# Patient Record
Sex: Female | Born: 1945 | Race: White | Hispanic: No | Marital: Single | Smoking: Former smoker
Health system: Southern US, Community
[De-identification: ages and names within clinical notes are randomized; demographics above are authoritative.]

## PROBLEM LIST (undated history)

## (undated) DIAGNOSIS — E43 Unspecified severe protein-calorie malnutrition: Secondary | ICD-10-CM

## (undated) DIAGNOSIS — Z22322 Carrier or suspected carrier of Methicillin resistant Staphylococcus aureus: Secondary | ICD-10-CM

## (undated) DIAGNOSIS — C801 Malignant (primary) neoplasm, unspecified: Secondary | ICD-10-CM

## (undated) DIAGNOSIS — C7951 Secondary malignant neoplasm of bone: Secondary | ICD-10-CM

## (undated) DIAGNOSIS — M069 Rheumatoid arthritis, unspecified: Secondary | ICD-10-CM

## (undated) HISTORY — PX: NO PAST SURGERIES: SHX2092

---

## 1999-02-25 ENCOUNTER — Ambulatory Visit (HOSPITAL_BASED_OUTPATIENT_CLINIC_OR_DEPARTMENT_OTHER): Admission: RE | Admit: 1999-02-25 | Discharge: 1999-02-25 | Payer: Self-pay | Admitting: Orthopedic Surgery

## 2004-02-23 ENCOUNTER — Ambulatory Visit (HOSPITAL_COMMUNITY): Admission: RE | Admit: 2004-02-23 | Discharge: 2004-02-23 | Payer: Self-pay | Admitting: Orthopedic Surgery

## 2004-04-20 ENCOUNTER — Inpatient Hospital Stay (HOSPITAL_COMMUNITY): Admission: RE | Admit: 2004-04-20 | Discharge: 2004-04-21 | Payer: Self-pay | Admitting: Neurological Surgery

## 2014-03-12 ENCOUNTER — Emergency Department (HOSPITAL_COMMUNITY): Payer: Medicare Other

## 2014-03-12 ENCOUNTER — Encounter (HOSPITAL_COMMUNITY): Payer: Self-pay | Admitting: Emergency Medicine

## 2014-03-12 ENCOUNTER — Inpatient Hospital Stay (HOSPITAL_COMMUNITY)
Admission: EM | Admit: 2014-03-12 | Discharge: 2014-04-16 | DRG: 166 | Disposition: E | Payer: Medicare Other | Attending: Internal Medicine | Admitting: Internal Medicine

## 2014-03-12 DIAGNOSIS — Z9181 History of falling: Secondary | ICD-10-CM

## 2014-03-12 DIAGNOSIS — Z79899 Other long term (current) drug therapy: Secondary | ICD-10-CM

## 2014-03-12 DIAGNOSIS — C3492 Malignant neoplasm of unspecified part of left bronchus or lung: Secondary | ICD-10-CM

## 2014-03-12 DIAGNOSIS — Z87891 Personal history of nicotine dependence: Secondary | ICD-10-CM | POA: Diagnosis not present

## 2014-03-12 DIAGNOSIS — C7951 Secondary malignant neoplasm of bone: Secondary | ICD-10-CM | POA: Diagnosis present

## 2014-03-12 DIAGNOSIS — R5081 Fever presenting with conditions classified elsewhere: Secondary | ICD-10-CM | POA: Diagnosis not present

## 2014-03-12 DIAGNOSIS — Z66 Do not resuscitate: Secondary | ICD-10-CM | POA: Diagnosis present

## 2014-03-12 DIAGNOSIS — D759 Disease of blood and blood-forming organs, unspecified: Secondary | ICD-10-CM | POA: Diagnosis present

## 2014-03-12 DIAGNOSIS — F419 Anxiety disorder, unspecified: Secondary | ICD-10-CM | POA: Diagnosis present

## 2014-03-12 DIAGNOSIS — C787 Secondary malignant neoplasm of liver and intrahepatic bile duct: Secondary | ICD-10-CM | POA: Diagnosis present

## 2014-03-12 DIAGNOSIS — S2239XA Fracture of one rib, unspecified side, initial encounter for closed fracture: Secondary | ICD-10-CM | POA: Diagnosis present

## 2014-03-12 DIAGNOSIS — M545 Low back pain, unspecified: Secondary | ICD-10-CM | POA: Diagnosis present

## 2014-03-12 DIAGNOSIS — R222 Localized swelling, mass and lump, trunk: Secondary | ICD-10-CM | POA: Diagnosis present

## 2014-03-12 DIAGNOSIS — M25569 Pain in unspecified knee: Secondary | ICD-10-CM | POA: Diagnosis present

## 2014-03-12 DIAGNOSIS — F329 Major depressive disorder, single episode, unspecified: Secondary | ICD-10-CM | POA: Diagnosis present

## 2014-03-12 DIAGNOSIS — R109 Unspecified abdominal pain: Secondary | ICD-10-CM | POA: Diagnosis present

## 2014-03-12 DIAGNOSIS — M069 Rheumatoid arthritis, unspecified: Secondary | ICD-10-CM | POA: Diagnosis present

## 2014-03-12 DIAGNOSIS — Z515 Encounter for palliative care: Secondary | ICD-10-CM | POA: Diagnosis not present

## 2014-03-12 DIAGNOSIS — D696 Thrombocytopenia, unspecified: Secondary | ICD-10-CM | POA: Diagnosis present

## 2014-03-12 DIAGNOSIS — G893 Neoplasm related pain (acute) (chronic): Secondary | ICD-10-CM | POA: Diagnosis present

## 2014-03-12 DIAGNOSIS — F32A Depression, unspecified: Secondary | ICD-10-CM | POA: Diagnosis present

## 2014-03-12 DIAGNOSIS — R918 Other nonspecific abnormal finding of lung field: Secondary | ICD-10-CM | POA: Diagnosis present

## 2014-03-12 DIAGNOSIS — Z22322 Carrier or suspected carrier of Methicillin resistant Staphylococcus aureus: Secondary | ICD-10-CM

## 2014-03-12 DIAGNOSIS — E43 Unspecified severe protein-calorie malnutrition: Secondary | ICD-10-CM | POA: Diagnosis present

## 2014-03-12 DIAGNOSIS — D638 Anemia in other chronic diseases classified elsewhere: Secondary | ICD-10-CM | POA: Diagnosis present

## 2014-03-12 DIAGNOSIS — K30 Functional dyspepsia: Secondary | ICD-10-CM | POA: Diagnosis not present

## 2014-03-12 DIAGNOSIS — E876 Hypokalemia: Secondary | ICD-10-CM | POA: Diagnosis present

## 2014-03-12 DIAGNOSIS — R110 Nausea: Secondary | ICD-10-CM | POA: Diagnosis present

## 2014-03-12 DIAGNOSIS — C3411 Malignant neoplasm of upper lobe, right bronchus or lung: Principal | ICD-10-CM | POA: Diagnosis present

## 2014-03-12 DIAGNOSIS — Z7401 Bed confinement status: Secondary | ICD-10-CM | POA: Diagnosis not present

## 2014-03-12 DIAGNOSIS — M549 Dorsalgia, unspecified: Secondary | ICD-10-CM

## 2014-03-12 DIAGNOSIS — K1230 Oral mucositis (ulcerative), unspecified: Secondary | ICD-10-CM | POA: Insufficient documentation

## 2014-03-12 DIAGNOSIS — D61810 Antineoplastic chemotherapy induced pancytopenia: Secondary | ICD-10-CM | POA: Diagnosis present

## 2014-03-12 DIAGNOSIS — D6959 Other secondary thrombocytopenia: Secondary | ICD-10-CM | POA: Diagnosis present

## 2014-03-12 DIAGNOSIS — E870 Hyperosmolality and hypernatremia: Secondary | ICD-10-CM | POA: Diagnosis present

## 2014-03-12 DIAGNOSIS — Z7952 Long term (current) use of systemic steroids: Secondary | ICD-10-CM

## 2014-03-12 DIAGNOSIS — R0602 Shortness of breath: Secondary | ICD-10-CM

## 2014-03-12 DIAGNOSIS — E873 Alkalosis: Secondary | ICD-10-CM | POA: Diagnosis present

## 2014-03-12 DIAGNOSIS — J189 Pneumonia, unspecified organism: Secondary | ICD-10-CM | POA: Diagnosis present

## 2014-03-12 DIAGNOSIS — K5900 Constipation, unspecified: Secondary | ICD-10-CM | POA: Diagnosis present

## 2014-03-12 DIAGNOSIS — E860 Dehydration: Secondary | ICD-10-CM | POA: Diagnosis present

## 2014-03-12 DIAGNOSIS — Z9889 Other specified postprocedural states: Secondary | ICD-10-CM

## 2014-03-12 DIAGNOSIS — I10 Essential (primary) hypertension: Secondary | ICD-10-CM | POA: Diagnosis present

## 2014-03-12 DIAGNOSIS — J9601 Acute respiratory failure with hypoxia: Secondary | ICD-10-CM | POA: Insufficient documentation

## 2014-03-12 DIAGNOSIS — Z8614 Personal history of Methicillin resistant Staphylococcus aureus infection: Secondary | ICD-10-CM

## 2014-03-12 DIAGNOSIS — T451X5A Adverse effect of antineoplastic and immunosuppressive drugs, initial encounter: Secondary | ICD-10-CM | POA: Diagnosis present

## 2014-03-12 DIAGNOSIS — G9340 Encephalopathy, unspecified: Secondary | ICD-10-CM | POA: Diagnosis not present

## 2014-03-12 DIAGNOSIS — J90 Pleural effusion, not elsewhere classified: Secondary | ICD-10-CM | POA: Insufficient documentation

## 2014-03-12 DIAGNOSIS — N179 Acute kidney failure, unspecified: Secondary | ICD-10-CM | POA: Diagnosis not present

## 2014-03-12 DIAGNOSIS — G4700 Insomnia, unspecified: Secondary | ICD-10-CM | POA: Diagnosis present

## 2014-03-12 DIAGNOSIS — Z4659 Encounter for fitting and adjustment of other gastrointestinal appliance and device: Secondary | ICD-10-CM

## 2014-03-12 DIAGNOSIS — A419 Sepsis, unspecified organism: Secondary | ICD-10-CM | POA: Diagnosis present

## 2014-03-12 DIAGNOSIS — J918 Pleural effusion in other conditions classified elsewhere: Secondary | ICD-10-CM

## 2014-03-12 DIAGNOSIS — J9811 Atelectasis: Secondary | ICD-10-CM | POA: Insufficient documentation

## 2014-03-12 DIAGNOSIS — J910 Malignant pleural effusion: Secondary | ICD-10-CM | POA: Diagnosis present

## 2014-03-12 DIAGNOSIS — C3490 Malignant neoplasm of unspecified part of unspecified bronchus or lung: Secondary | ICD-10-CM | POA: Insufficient documentation

## 2014-03-12 DIAGNOSIS — C3491 Malignant neoplasm of unspecified part of right bronchus or lung: Secondary | ICD-10-CM

## 2014-03-12 DIAGNOSIS — C801 Malignant (primary) neoplasm, unspecified: Secondary | ICD-10-CM | POA: Diagnosis present

## 2014-03-12 DIAGNOSIS — R630 Anorexia: Secondary | ICD-10-CM | POA: Diagnosis present

## 2014-03-12 HISTORY — DX: Secondary malignant neoplasm of bone: C79.51

## 2014-03-12 HISTORY — DX: Malignant (primary) neoplasm, unspecified: C80.1

## 2014-03-12 HISTORY — DX: Rheumatoid arthritis, unspecified: M06.9

## 2014-03-12 HISTORY — DX: Carrier or suspected carrier of methicillin resistant Staphylococcus aureus: Z22.322

## 2014-03-12 HISTORY — DX: Unspecified severe protein-calorie malnutrition: E43

## 2014-03-12 LAB — URINALYSIS, ROUTINE W REFLEX MICROSCOPIC
Glucose, UA: NEGATIVE mg/dL
Hgb urine dipstick: NEGATIVE
Ketones, ur: 40 mg/dL — AB
Nitrite: NEGATIVE
Protein, ur: NEGATIVE mg/dL
Specific Gravity, Urine: 1.017 (ref 1.005–1.030)
Urobilinogen, UA: 1 mg/dL (ref 0.0–1.0)
pH: 6 (ref 5.0–8.0)

## 2014-03-12 LAB — PRO B NATRIURETIC PEPTIDE: Pro B Natriuretic peptide (BNP): 769.4 pg/mL — ABNORMAL HIGH (ref 0–125)

## 2014-03-12 LAB — COMPREHENSIVE METABOLIC PANEL
ALT: 18 U/L (ref 0–35)
AST: 76 U/L — ABNORMAL HIGH (ref 0–37)
Albumin: 2.2 g/dL — ABNORMAL LOW (ref 3.5–5.2)
Alkaline Phosphatase: 140 U/L — ABNORMAL HIGH (ref 39–117)
Anion gap: 22 — ABNORMAL HIGH (ref 5–15)
BUN: 24 mg/dL — ABNORMAL HIGH (ref 6–23)
CO2: 24 mEq/L (ref 19–32)
Calcium: 10.6 mg/dL — ABNORMAL HIGH (ref 8.4–10.5)
Chloride: 94 mEq/L — ABNORMAL LOW (ref 96–112)
Creatinine, Ser: 0.54 mg/dL (ref 0.50–1.10)
GFR calc Af Amer: >90 mL/min (ref >90)
GFR calc non Af Amer: >90 mL/min (ref >90)
Glucose, Bld: 86 mg/dL (ref 70–99)
Potassium: 4.5 mEq/L (ref 3.7–5.3)
Sodium: 140 mEq/L (ref 137–147)
Total Bilirubin: 0.4 mg/dL (ref 0.3–1.2)
Total Protein: 7.2 g/dL (ref 6.0–8.3)

## 2014-03-12 LAB — CBC WITH DIFFERENTIAL/PLATELET
Basophils Absolute: 0.1 10*3/uL (ref 0.0–0.1)
Basophils Relative: 1 % (ref 0–1)
Eosinophils Absolute: 0.1 10*3/uL (ref 0.0–0.7)
Eosinophils Relative: 1 % (ref 0–5)
HCT: 35.1 % — ABNORMAL LOW (ref 36.0–46.0)
Hemoglobin: 11.3 g/dL — ABNORMAL LOW (ref 12.0–15.0)
Lymphocytes Relative: 14 % (ref 12–46)
Lymphs Abs: 1.9 10*3/uL (ref 0.7–4.0)
MCH: 30.5 pg (ref 26.0–34.0)
MCHC: 32.2 g/dL (ref 30.0–36.0)
MCV: 94.9 fL (ref 78.0–100.0)
Monocytes Absolute: 0.6 10*3/uL (ref 0.1–1.0)
Monocytes Relative: 4 % (ref 3–12)
Neutro Abs: 11.2 10*3/uL — ABNORMAL HIGH (ref 1.7–7.7)
Neutrophils Relative %: 80 % — ABNORMAL HIGH (ref 43–77)
Platelets: 164 10*3/uL (ref 150–400)
RBC: 3.7 MIL/uL — ABNORMAL LOW (ref 3.87–5.11)
RDW: 16.6 % — ABNORMAL HIGH (ref 11.5–15.5)
Smear Review: ADEQUATE
WBC: 13.9 10*3/uL — ABNORMAL HIGH (ref 4.0–10.5)

## 2014-03-12 LAB — I-STAT CG4 LACTIC ACID, ED: Lactic Acid, Venous: 1.73 mmol/L (ref 0.5–2.2)

## 2014-03-12 LAB — I-STAT TROPONIN, ED: Troponin i, poc: 0.01 ng/mL (ref 0.00–0.08)

## 2014-03-12 LAB — URINE MICROSCOPIC-ADD ON

## 2014-03-12 MED ORDER — IOHEXOL 300 MG/ML  SOLN
80.0000 mL | Freq: Once | INTRAMUSCULAR | Status: AC | PRN
  Administered 2014-03-12: 80 mL via INTRAVENOUS

## 2014-03-12 MED ORDER — HEPARIN SODIUM (PORCINE) 5000 UNIT/ML IJ SOLN
5000.0000 [IU] | Freq: Three times a day (TID) | INTRAMUSCULAR | Status: DC
  Administered 2014-03-12 – 2014-03-16 (×10): 5000 [IU] via SUBCUTANEOUS
  Filled 2014-03-12 (×16): qty 1

## 2014-03-12 MED ORDER — AMITRIPTYLINE HCL 25 MG PO TABS
100.0000 mg | ORAL_TABLET | Freq: Every day | ORAL | Status: DC
  Administered 2014-03-12 – 2014-03-22 (×7): 100 mg via ORAL
  Filled 2014-03-12 (×2): qty 1
  Filled 2014-03-12 (×3): qty 4
  Filled 2014-03-12: qty 1
  Filled 2014-03-12: qty 4
  Filled 2014-03-12 (×4): qty 1
  Filled 2014-03-12: qty 4

## 2014-03-12 MED ORDER — HYDROCODONE-ACETAMINOPHEN 5-325 MG PO TABS
1.0000 | ORAL_TABLET | Freq: Four times a day (QID) | ORAL | Status: DC | PRN
  Administered 2014-03-12 – 2014-03-14 (×5): 1 via ORAL
  Filled 2014-03-12 (×5): qty 1

## 2014-03-12 MED ORDER — HYDROCODONE-ACETAMINOPHEN 5-325 MG PO TABS: 1.0000 | ORAL_TABLET | Freq: Four times a day (QID) | ORAL | Status: DC | PRN

## 2014-03-12 MED ORDER — SODIUM CHLORIDE 0.9 % IV SOLN
INTRAVENOUS | Status: DC
  Administered 2014-03-12: 21:00:00 via INTRAVENOUS
  Administered 2014-03-13: 1000 mL via INTRAVENOUS
  Administered 2014-03-14 – 2014-03-23 (×12): via INTRAVENOUS

## 2014-03-12 MED ORDER — DEXTROSE 5 % IV SOLN
500.0000 mg | Freq: Once | INTRAVENOUS | Status: AC
  Administered 2014-03-12: 500 mg via INTRAVENOUS
  Filled 2014-03-12: qty 500

## 2014-03-12 MED ORDER — ACETAMINOPHEN 325 MG PO TABS
650.0000 mg | ORAL_TABLET | Freq: Once | ORAL | Status: AC
  Administered 2014-03-12: 650 mg via ORAL
  Filled 2014-03-12: qty 2

## 2014-03-12 MED ORDER — SODIUM CHLORIDE 0.9 % IV SOLN
500.0000 mg | Freq: Two times a day (BID) | INTRAVENOUS | Status: DC
  Administered 2014-03-12 – 2014-03-16 (×8): 500 mg via INTRAVENOUS
  Filled 2014-03-12 (×11): qty 500

## 2014-03-12 MED ORDER — POLYETHYLENE GLYCOL 3350 17 G PO PACK
17.0000 g | PACK | Freq: Every day | ORAL | Status: DC
  Administered 2014-03-12 – 2014-03-28 (×9): 17 g via ORAL
  Filled 2014-03-12 (×13): qty 1

## 2014-03-12 MED ORDER — PREDNISONE 10 MG PO TABS
30.0000 mg | ORAL_TABLET | Freq: Every day | ORAL | Status: DC
  Administered 2014-03-13 – 2014-03-15 (×3): 30 mg via ORAL
  Filled 2014-03-12 (×4): qty 1

## 2014-03-12 MED ORDER — DEXTROSE 5 % IV SOLN
1.0000 g | Freq: Once | INTRAVENOUS | Status: AC
  Administered 2014-03-12: 1 g via INTRAVENOUS
  Filled 2014-03-12: qty 10

## 2014-03-12 MED ORDER — SODIUM CHLORIDE 0.9 % IV BOLUS (SEPSIS)
2000.0000 mL | Freq: Once | INTRAVENOUS | Status: AC
  Administered 2014-03-12: 2000 mL via INTRAVENOUS

## 2014-03-12 MED ORDER — SODIUM CHLORIDE 0.9 % IV BOLUS (SEPSIS): 1000.0000 mL | INTRAVENOUS | Status: DC

## 2014-03-12 MED ORDER — METHOTREXATE 2.5 MG PO TABS
15.0000 mg | ORAL_TABLET | ORAL | Status: DC
  Administered 2014-03-14: 15 mg via ORAL
  Filled 2014-03-12: qty 6

## 2014-03-12 MED ORDER — FOLIC ACID 1 MG PO TABS
1.0000 mg | ORAL_TABLET | Freq: Every day | ORAL | Status: DC
  Administered 2014-03-13 – 2014-03-25 (×11): 1 mg via ORAL
  Filled 2014-03-12 (×12): qty 1

## 2014-03-12 MED ORDER — GABAPENTIN 400 MG PO CAPS
400.0000 mg | ORAL_CAPSULE | Freq: Every day | ORAL | Status: DC
  Administered 2014-03-13 – 2014-03-20 (×8): 400 mg via ORAL
  Filled 2014-03-12 (×8): qty 1

## 2014-03-12 MED ORDER — SODIUM CHLORIDE 0.9 % IJ SOLN
3.0000 mL | Freq: Two times a day (BID) | INTRAMUSCULAR | Status: DC
  Administered 2014-03-13 – 2014-03-26 (×16): 3 mL via INTRAVENOUS

## 2014-03-12 MED ORDER — PIPERACILLIN-TAZOBACTAM 3.375 G IVPB
3.3750 g | Freq: Three times a day (TID) | INTRAVENOUS | Status: DC
  Administered 2014-03-12 – 2014-03-22 (×27): 3.375 g via INTRAVENOUS
  Filled 2014-03-12 (×32): qty 50

## 2014-03-12 NOTE — ED Provider Notes (Signed)
CSN: [id number]     Arrival date & time 02/15/2014  1157 History   First MD Initiated Contact with Patient 02/24/2014 1251     Chief Complaint  Patient presents with  . Weakness   Level V Caveat--AMS  (Consider location/radiation/quality/duration/timing/severity/associated sxs/prior Treatment) HPI Debbie Howe is a 68 y.o. female with RA who is here today for evaluation of shortness of breath. Patient is accompanied by her sister who says she saw patient on Sunday and thought she had pneumonia and reports she was "breathing funny the week before also". Sister reports patient has had chills but no fever at home, no cough or other URI symptoms. Patient has a 50 year pack smoking history, but has been using vapes for the past year. Sister denies patient has any other past medical history, she has not been to seek care.  History reviewed. No pertinent past medical history. History reviewed. No pertinent past surgical history. History reviewed. No pertinent family history. History  Substance Use Topics  . Smoking status: Not on file  . Smokeless tobacco: Not on file  . Alcohol Use: Yes     Comment: occ   OB History   Grav Para Term Preterm Abortions TAB SAB Ect Mult Living                 Review of Systems  Unable to perform ROS     Allergies  Review of patient's allergies indicates no known allergies.  Home Medications   Prior to Admission medications   Medication Sig Start Date End Date Taking? Authorizing Provider  amitriptyline (ELAVIL) 25 MG tablet Take 100 mg by mouth daily.   Yes Historical Provider, MD  Calcium Carb-Cholecalciferol (CALCIUM 600 + D PO) Take 1 tablet by mouth daily.   Yes Historical Provider, MD  folic acid (FOLVITE) 1 MG tablet Take 1 mg by mouth daily.   Yes Historical Provider, MD  gabapentin (NEURONTIN) 400 MG capsule Take 400 mg by mouth daily.   Yes Historical Provider, MD  methotrexate (RHEUMATREX) 2.5 MG tablet Take 15 mg by mouth once a week. On  Thursday. Caution:Chemotherapy. Protect from light.   Yes Historical Provider, MD  predniSONE (DELTASONE) 5 MG tablet Take 10 mg by mouth daily with breakfast.   Yes Historical Provider, MD  vitamin E 400 UNIT capsule Take 400 Units by mouth 2 (two) times daily.   Yes Historical Provider, MD   BP 139/81  Pulse 125  Temp(Src) 99.6 F (37.6 C) (Oral)  Resp 20  SpO2 97% Physical Exam  Nursing note and vitals reviewed. Constitutional: She appears well-developed and well-nourished.  HENT:  Head: Normocephalic and atraumatic.  Mouth/Throat: Oropharynx is clear and moist.  Eyes: Conjunctivae are normal. Pupils are equal, round, and reactive to light. Right eye exhibits no discharge. Left eye exhibits no discharge. No scleral icterus.  Neck: Normal range of motion. Neck supple. No JVD present. No tracheal deviation present.  Cardiovascular: Regular rhythm and normal heart sounds.   Tachycardic on exam  Pulmonary/Chest: Effort normal. No respiratory distress. She has no wheezes. She has no rales.  Diminished lung sounds right upper lobe. Tachypneic. No stridor  Abdominal: Soft. There is no tenderness.  Musculoskeletal: She exhibits no edema and no tenderness.  Neurological: She is alert.  Cranial Nerves II-XII grossly intact. Patient is alert and oriented to person and place but thinks the year is 94. States she has to go home because it's 1:30 but does not know the reason why.  Skin:  Skin is warm and dry. No rash noted.  Psychiatric: She has a normal mood and affect. Her behavior is normal. Thought content normal.    ED Course  Procedures (including critical care time) Labs Review Labs Reviewed  CBC WITH DIFFERENTIAL - Abnormal; Notable for the following:    WBC 13.9 (*)    RBC 3.70 (*)    Hemoglobin 11.3 (*)    HCT 35.1 (*)    RDW 16.6 (*)    Neutrophils Relative % 80 (*)    Neutro Abs 11.2 (*)    All other components within normal limits  COMPREHENSIVE METABOLIC PANEL -  Abnormal; Notable for the following:    Chloride 94 (*)    BUN 24 (*)    Calcium 10.6 (*)    Albumin 2.2 (*)    AST 76 (*)    Alkaline Phosphatase 140 (*)    Anion gap 22 (*)    All other components within normal limits  PRO B NATRIURETIC PEPTIDE  URINALYSIS, ROUTINE W REFLEX MICROSCOPIC  I-STAT TROPOININ, ED  I-STAT CG4 LACTIC ACID, ED    Imaging Review Dg Chest Port 1 View  03/13/2014   CLINICAL DATA:  Shortness of breath and nausea today.  EXAM: PORTABLE CHEST - 1 VIEW  COMPARISON:  Chest CT 06/18/2010  FINDINGS: Dense right upper lobe airspace consolidation could reflect lobar pneumonia or an obstructing mass (favored). Recommend chest CT with contrast for further evaluation. The left lung is clear. No pleural effusion. The bony thorax is intact.  IMPRESSION: Dense right upper lobe airspace consolidation could reflect lobar pneumonia or obstructing mass (favored). Recommend chest CT with contrast for further evaluation.   Electronically Signed   By: Kalman Jewels M.D.   On: 02/20/2014 13:13     EKG Interpretation None      MDM   White count 13.9, tachycardic, rectal temperature 101, infection from pulmonary source. Code Sepsis Order Set initiated  Blood Cultures obtained x2 IV Rocephin and Zithromax initiated in ED for CAP. Pending result of CT.--CT shows significant malignancy with acute pneumonitis. Will need further evaluation with Oncology/Pulmonology. Discussed ED course, results of imaging as well as plan for admission, patient verbalizes understanding and is amenable.  Consult to Internal Medicine, Dr. Redmond Pulling to come see in ED.  Prior to patient discharge, I discussed and reviewed this case with Dr. Jeneen Rinks    Final diagnoses:  SOB (shortness of breath)        Verl Dicker, PA-C 03/11/2014 [place], MD 03/22/14 [phone]

## 2014-03-12 NOTE — ED Notes (Signed)
Notified CT of IV access.

## 2014-03-12 NOTE — ED Notes (Signed)
Phlebotomy at bedside getting second set of cultures at this time.

## 2014-03-12 NOTE — ED Notes (Signed)
Dr. Jeneen Rinks started IV in left [facility] for CT scan.

## 2014-03-12 NOTE — ED Notes (Signed)
IV team unable to start an IV above wrist level for CT x 2 attempts. Notified Dr. Jeneen Rinks.

## 2014-03-12 NOTE — ED Notes (Signed)
Patient returned from CT

## 2014-03-12 NOTE — ED Provider Notes (Signed)
Patient's doing. Discussed with PA Cartner. His x-ray shows dense right upper lobe consolidation versus mass. On exam has crackles in right upper lobe. Borderline saturations, temp 101. She pressure 84. Spontaneously this pressures improved to 120 and has no additional episodes of hypotension. Initially confused, quickly clears with O2, Tylenol, fluids, no mentating normally. He required pneumonia by history. Blood cultures obtained, given IV Rocephin and Zithromax. Differential diagnosis per radiologist included right upper lobe mass with postobstructive pneumonia. CT recommended. Patient required more proximal 20-gauge Angiocath. Ultrasound-guided IV placed by myself. This is at this point pending. Call placed regarding admission.          Tanna Furry, MD 02/16/2014 [phone]

## 2014-03-12 NOTE — Progress Notes (Signed)
MD made aware of patient's arrival to room. Patient made comfortable and call bell in place. Report given to night shift RN Alleen Borne

## 2014-03-12 NOTE — ED Notes (Signed)
Pt appears very weak and pale, sob at triage. Needed assistance getting out of car. Pt reports not feeling well x 3 weeks. ekg being done. bp 94/64 at triage.

## 2014-03-12 NOTE — H&P (Signed)
Date: 02/24/2014               Patient Name:  Debbie Howe MRN: [id number]  DOB: 1945/09/22 Age / Sex: 68 y.o., female   PCP: Charletta Cousin, MD         Medical Service: Internal Medicine Teaching Service         Attending Physician: Dr. Aldine Contes, MD    First Contact: Dr. Lottie Mussel Pager: [phone]  Second Contact: Dr. Duwaine Maxin Pager: [phone]       After Hours (After 5p/  First Contact Pager: [phone]  weekends / holidays): Second Contact Pager: [phone]   Chief Complaint: shortness of breath  History of Present Illness: Debbie Howe is 68 year old woman with RA who presented with SOB. Per the patient and her significant other, she was in her usual state of health until ~1-1.5 months ago. She then developed a sore midline lower back pain that radiates down her legs. She says this back pain is different than any pain she has had with her RA in the past. She also developed concurrent shortness of breath. She says the SOB is worse when she is lying flat or on exertion. She also reports some unknown wait loss over this time frame as well as night sweats and fatigue. Of note, she has a 50 pack year history of smoking <1 ppd but has been just using vapes for the past year. She denies chest pain, abdominal pain, dysuria, cough.  In the ED, she had WBC 13.9, tachycardia in the 120s, rectal temperature 101, so was started on azithromycin 500 mg IV once,ceftriaxone 1 g once, 2L NS bolus and tylenol 650 mg po once. She had CXR with dense R upper airspace consolidation and then had CT with a large R hilar and suprahilar mass and extensive lymphadenopathy.  Meds: Current Facility-Administered Medications  Medication Dose Route Frequency Provider Last Rate Last Dose  . 0.9 %  sodium chloride infusion   Intravenous Continuous Francesca Oman, DO      . amitriptyline (ELAVIL) tablet 100 mg  100 mg Oral QHS Francesca Oman, DO      . Derrill Memo ON 37/02/6268] folic acid (FOLVITE) tablet 1 mg  1 mg Oral  Daily Francesca Oman, DO      . Derrill Memo ON 03/13/2014] gabapentin (NEURONTIN) capsule 400 mg  400 mg Oral Daily Francesca Oman, DO      . heparin injection 5,000 Units  5,000 Units Subcutaneous 3 times per day Francesca Oman, DO      . HYDROcodone-acetaminophen (NORCO/VICODIN) 5-325 MG per tablet 1 tablet  1 tablet Oral Q6H PRN Jacques Earthly, MD   1 tablet at 03/03/2014 1934  . [START ON 02/23/2014] methotrexate (RHEUMATREX) tablet 15 mg  15 mg Oral Q Thu Alex M Wilson, DO      . piperacillin-tazobactam (ZOSYN) IVPB 3.375 g  3.375 g Intravenous Q8H Nischal Narendra, MD      . polyethylene glycol (MIRALAX / GLYCOLAX) packet 17 g  17 g Oral Daily Jacques Earthly, MD      . Derrill Memo ON 03/13/2014] predniSONE (DELTASONE) tablet 30 mg  30 mg Oral Q breakfast Kelby Aline, MD      . sodium chloride 0.9 % injection 3 mL  3 mL Intravenous Q12H Francesca Oman, DO      . vancomycin (VANCOCIN) 500 mg in sodium chloride 0.9 % 100 mL IVPB  500 mg Intravenous Q12H Aldine Contes, MD  Allergies: Allergies as of 02/17/2014  . (No Known Allergies)   Past Medical History  Diagnosis Date  . Rheumatoid arthritis    Past Surgical History  Procedure Laterality Date  . No past surgeries     History reviewed. No pertinent family history. History   Social History  . Marital Status: Single    Spouse Name: N/A    Number of Children: N/A  . Years of Education: N/A   Occupational History  . Not on file.   Social History Main Topics  . Smoking status: Former Smoker    Quit date: 01/10/2013  . Smokeless tobacco: Not on file  . Alcohol Use: Yes     Comment: occ  . Drug Use: No  . Sexual Activity: Not on file   Other Topics Concern  . Not on file   Social History Narrative  . No narrative on file    Review of Systems: A comprehensive review of systems was negative except for: as noted above in HPI  Physical Exam: Blood pressure 126/76, pulse 104, temperature 98.4 F (36.9 C), temperature  source Oral, resp. rate 23, height 5\' 2"  (1.575 m), weight 133 lb (60.328 kg), SpO2 93.00%.  Gen: A&O x 4, no acute distress, thin HEENT: Atraumatic, PERRL, EOMI, sclerae anicteric, dry mucous membranes Neck: No LAD appreciated Heart: tachycardic but regular, normal S1 S2, no murmurs, rubs, or gallops Lungs: bilateral inspiratory wheezes with basilar crackles, respirations unlabored Abd: Soft, non-tender, non-distended, + bowel sounds, no hepatosplenomegaly Ext: No edema or cyanosis   Lab results: Basic Metabolic Panel:  Recent Labs  03/07/2014 1227  NA 140  K 4.5  CL 94*  CO2 24  GLUCOSE 86  BUN 24*  CREATININE 0.54  CALCIUM 10.6*   Liver Function Tests:  Recent Labs  02/28/2014 1227  AST 76*  ALT 18  ALKPHOS 140*  BILITOT 0.4  PROT 7.2  ALBUMIN 2.2*   CBC:  Recent Labs  03/13/2014 1227  WBC 13.9*  NEUTROABS 11.2*  HGB 11.3*  HCT 35.1*  MCV 94.9  PLT 164   Cardiac Enzymes: i-stat troponin i 0.01 BNP:  Recent Labs  03/11/2014 1515  PROBNP 769.4*   Urinalysis:  Recent Labs  02/18/2014 1401  COLORURINE AMBER*  LABSPEC 1.017  PHURINE 6.0  GLUCOSEU NEGATIVE  HGBUR NEGATIVE  BILIRUBINUR MODERATE*  KETONESUR 40*  PROTEINUR NEGATIVE  UROBILINOGEN 1.0  NITRITE NEGATIVE  LEUKOCYTESUR SMALL*   Misc. Labs: i-stat lactic acid 1.73  Imaging results:  Ct Chest W Contrast  03/05/2014   CLINICAL DATA:  Shortness of breath. Right upper lobe consolidation.  EXAM: CT CHEST WITH CONTRAST  TECHNIQUE: Multidetector CT imaging of the chest was performed during intravenous contrast administration.  CONTRAST:  58mL OMNIPAQUE IOHEXOL 300 MG/ML  SOLN  COMPARISON:  03/06/2014; 06/18/2010  FINDINGS: Unfortunately there is extensive mediastinal and right hilar adenopathy and right hilar mass causing obstruction of the right upper lobe bronchus and postobstructive pneumonitis/ drowned lung appearance. The super hilar component of the mass is difficult to measure given the  similar density of surrounding atelectatic lung but based on the surrounding vessels is estimated to be about 5.4 by 4.1 cm. An upper right paratracheal lymph node measures 1.8 cm in short axis, image 13 series 2. A precarinal lymph node has indistinct margins but measures approximately 3 cm in short axis, image 23 series 2. Subcarinal node measures 3 cm in short axis, image 27 series 2. Infrahilar node on the right 2 cm in  short axis, image 32 series 2.  There is some extrinsic mass effect on the right pulmonary artery and its branches. I cannot exclude invasion of the right pulmonary artery with bland or tumor thrombus in the right pulmonary artery on images 29 through 34 of series 2. Similar appearance on images 61 through 47 of series 6. Tumor and/or confluent nodal disease surrounds the bronchus intermedius and carina. 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 Paratracheal and right upper internal mammary adenopathy. A pathologic right axillary lymph node measures 1.7 cm in short axis, image 23 series 2.  Small right pleural effusion associated with a 0.9 by 0.7 cm pleural nodule medially along the right posterior pleura, image 45 series 2. There is also pleural nodularity further laterally at the same level measuring approximately 1.6 by 0.6 cm. Mild pleural nodularity along the right middle lobe. There is pleural nodularity along the right hemidiaphragm. Appearance compatible with malignant effusion.  Multiple abnormal hypodense lesions are present in the visualized right hepatic lobe. An index lesion in segment 8 measures 1.3 by 0.9 cm on image 49 of series 2. At least 2 other hypodense lesions are present in the liver. There is scarring in the right mid kidney anteriorly. No adrenal mass.  A superior segment right lower lobe nodule measures 0.5 by 0.4 cm on image 16 of series 3.  Bilateral C7 cervical ribs. Right supraclavicular lymph node short axis diameter 1.6 cm,  image 9 series 2. No definite osseous metastatic disease. Mild coronary artery atherosclerosis in the left main coronary.  IMPRESSION: 1. Unfortunately the patient has a large right hilar and suprahilar mass with extensive adenopathy in the mediastinum, right hilum, right infrahilar region, right axilla, and right supraclavicular region in addition to a small but malignant right pleural effusion. Appearance compatible with lung cancer, tissue diagnosis recommended. Hypodense liver lesions are not entirely specific but concerning for metastatic disease -nuclear medicine PET-CT may be helpful in determining the full extent of metastatic burden. Extensive postobstructive pneumonitis in the right upper lobe. 2. Right hilar mass is causing extrinsic compression on the right pulmonary artery to such a degree that I cannot completely exclude pulmonary arterial invasion with bland or tumor thrombus in the right pulmonary artery and its branches. Today's exam was not performed with the pulmonary embolus protocol. 3. Superior segment right lower lobe nodule, 0.5 by 0.4 cm, nonspecific but possibly metastatic. 4. 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   Electronically Signed   By: Sherryl Barters M.D.   On: 02/15/2014 16:39   Dg Chest Port 1 View  02/28/2014   CLINICAL DATA:  Shortness of breath and nausea today.  EXAM: PORTABLE CHEST - 1 VIEW  COMPARISON:  Chest CT 06/18/2010  FINDINGS: Dense right upper lobe airspace consolidation could reflect lobar pneumonia or an obstructing mass (favored). Recommend chest CT with contrast for further evaluation. The left lung is clear. No pleural effusion. The bony thorax is intact.  IMPRESSION: Dense right upper lobe airspace consolidation could reflect lobar pneumonia or obstructing mass (favored). Recommend chest CT with contrast for  further evaluation.   Electronically Signed   By: Kalman Jewels M.D.   On: 02/17/2014 13:13    Other results: EKG: sinus tachycardia, sinus rhythm, PR 122, QRS 72, QTc 431, no prior  Assessment & Plan by Problem: Active Problems:   Pneumonia   #  Thoracic Mass: Large R hilar mass is likely responsible for SOB. She may have superimposed pneumonia secondary to obstructive effect or the mass is responsible for leukocytosis, fever, tachycardia. She was sating in the upper 90s on room air during our exam. These findings on CT are concerning for lung cancer. Debbie Howe does have an extensive 69 year smoking history. Her back pain may be due to metastasis v her RA. The patient and her significant other are aware of the results of the CT and we explained that a tissue biopsy is required before further prognostication. 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 -call pulmonology regarding tissue biopsy -vanc/zosyn per pharmacy -NS @ 75 cc/hr -f/u BCx x 2 -cardiac monitoring -repeat CBC, CMP in am  #Rheumatoid Arthritis: Debbie Howe is on methotrexate 15 mg weekly on Thursdays and prednisone 10 mg daily. This may explain her back pain versus metastasis. -cont methotrexate 15 mg po weekly on Thursdays -prednisone 30 mg daily x 3 days as stress dose -vicodin 5-325 po q6hprn  #s/p orthopedic neck surgery: Patient and significant other report she had neck surgery several years ago but could not describe further with no known complications -cont gabapentin 400 mg daily presumed for this diagnsois  #Insomnia: Patient reports that she takes amitriptyline 100 mg daily for insomnia but question whether she is not forthcoming with any past psych history -continue amitripyline 100 mg daily   #Asymptomatic bacteruria: Patient has many bacteria on UA but denies dysuria. She is immunosuppressed and we have begun abx for possible postobstructive  PNA -cont abx per above  #Diet: regular  #DVT ppx: heparin 5000 u tid, SCD  Dispo: Disposition is deferred at this time, awaiting improvement of current medical problems. Anticipated discharge in approximately 2 day(s).   The patient does have a current PCP Charletta Cousin, MD) and does need an [facility] hospital follow-up appointment after discharge.  The patient does not know have transportation limitations that hinder transportation to clinic appointments.  Signed: Kelby Aline, MD 02/21/2014, 8:07 PM

## 2014-03-12 NOTE — Progress Notes (Signed)
Notified MD oncall that pt states she has not had a BM since Sunday 10/18. Pt running Stach HR 100-120's on tele. Pt asymptomatic. Will continue to monitor pt. Ranelle Oyster, RN

## 2014-03-12 NOTE — ED Notes (Signed)
Iv therapist returned call and will come to er asap to place iv

## 2014-03-12 NOTE — ED Notes (Signed)
Iv therapy paged

## 2014-03-12 NOTE — ED Notes (Signed)
IV team at bedside trying to get IV access for CT contrast.

## 2014-03-12 NOTE — Progress Notes (Signed)
Report received from [place], [place] for admission to [phone]

## 2014-03-12 NOTE — Progress Notes (Signed)
ANTIBIOTIC CONSULT NOTE - INITIAL  Pharmacy Consult for Vancomycin, Zosyn Indication: pneumonia  No Known Allergies  Patient Measurements: Height: 5\' 2"  (157.5 cm) Weight: 133 lb (60.328 kg) IBW/kg (Calculated) : 50.1  Labs:  Recent Labs  02/14/2014 1227  WBC 13.9*  HGB 11.3*  PLT 164  CREATININE 0.54   Estimated Creatinine Clearance: 56 ml/min (by C-G formula based on Cr of 0.54). No results found for this basename: VANCOTROUGH, VANCOPEAK, VANCORANDOM, GENTTROUGH, GENTPEAK, GENTRANDOM, TOBRATROUGH, TOBRAPEAK, TOBRARND, AMIKACINPEAK, AMIKACINTROU, AMIKACIN,  in the last 72 hours   Microbiology: No results found for this or any previous visit (from the past 720 hour(s)).  Medical History: Past Medical History  Diagnosis Date  . Rheumatoid arthritis     Assessment: 68 year old female beginning Vancomycin and Zosyn for pneumonia. Her renal function is stable. She received Rocephin and ZIthromax in the ED  Goal of Therapy:  Vancomycin trough level 15-20 mcg/ml Appropriate Zosyn dosing  Plan:  Zosyn 3.375 grams iv Q 8 hour - 4 hr infusion Vancomycin 500 mg iv Q 12 hours Follow Scr, progress, fever trend, cultures  Thank you. Anette Guarneri, PharmD [phone]  Debbie Howe 03/08/2014,7:36 PM

## 2014-03-13 DIAGNOSIS — G4700 Insomnia, unspecified: Secondary | ICD-10-CM

## 2014-03-13 DIAGNOSIS — D696 Thrombocytopenia, unspecified: Secondary | ICD-10-CM

## 2014-03-13 DIAGNOSIS — R918 Other nonspecific abnormal finding of lung field: Secondary | ICD-10-CM

## 2014-03-13 DIAGNOSIS — R0602 Shortness of breath: Secondary | ICD-10-CM

## 2014-03-13 DIAGNOSIS — M069 Rheumatoid arthritis, unspecified: Secondary | ICD-10-CM

## 2014-03-13 DIAGNOSIS — D649 Anemia, unspecified: Secondary | ICD-10-CM

## 2014-03-13 DIAGNOSIS — N390 Urinary tract infection, site not specified: Secondary | ICD-10-CM

## 2014-03-13 LAB — COMPREHENSIVE METABOLIC PANEL
ALBUMIN: 1.7 g/dL — AB (ref 3.5–5.2)
ALK PHOS: 142 U/L — AB (ref 39–117)
ALT: 16 U/L (ref 0–35)
ANION GAP: 16 — AB (ref 5–15)
AST: 83 U/L — AB (ref 0–37)
BUN: 15 mg/dL (ref 6–23)
CHLORIDE: 99 meq/L (ref 96–112)
CO2: 21 mEq/L (ref 19–32)
Calcium: 9.1 mg/dL (ref 8.4–10.5)
Creatinine, Ser: 0.58 mg/dL (ref 0.50–1.10)
GFR calc Af Amer: >90 mL/min (ref >90)
GFR calc non Af Amer: >90 mL/min (ref >90)
Glucose, Bld: 73 mg/dL (ref 70–99)
Potassium: 4.2 mEq/L (ref 3.7–5.3)
SODIUM: 136 meq/L — AB (ref 137–147)
Total Bilirubin: 0.4 mg/dL (ref 0.3–1.2)
Total Protein: 6.3 g/dL (ref 6.0–8.3)

## 2014-03-13 LAB — CBC
HEMATOCRIT: 29.8 % — AB (ref 36.0–46.0)
Hemoglobin: 9.3 g/dL — ABNORMAL LOW (ref 12.0–15.0)
MCH: 29.7 pg (ref 26.0–34.0)
MCHC: 31.2 g/dL (ref 30.0–36.0)
MCV: 95.2 fL (ref 78.0–100.0)
Platelets: 121 10*3/uL — ABNORMAL LOW (ref 150–400)
RBC: 3.13 MIL/uL — ABNORMAL LOW (ref 3.87–5.11)
RDW: 16.6 % — ABNORMAL HIGH (ref 11.5–15.5)
WBC: 10.1 10*3/uL (ref 4.0–10.5)

## 2014-03-13 LAB — URINE CULTURE: Colony Count: >=100000

## 2014-03-13 MED ORDER — BISACODYL 10 MG RE SUPP
10.0000 mg | Freq: Every day | RECTAL | Status: DC | PRN
  Administered 2014-03-13: 10 mg via RECTAL
  Filled 2014-03-13: qty 1

## 2014-03-13 MED ORDER — ALBUTEROL SULFATE (2.5 MG/3ML) 0.083% IN NEBU
2.5000 mg | INHALATION_SOLUTION | RESPIRATORY_TRACT | Status: DC | PRN
  Administered 2014-03-14: 2.5 mg via RESPIRATORY_TRACT
  Filled 2014-03-13 (×2): qty 3

## 2014-03-13 MED ORDER — ENSURE COMPLETE PO LIQD
237.0000 mL | Freq: Two times a day (BID) | ORAL | Status: DC
  Administered 2014-03-13 – 2014-03-19 (×6): 237 mL via ORAL

## 2014-03-13 NOTE — Progress Notes (Signed)
INITIAL NUTRITION ASSESSMENT  DOCUMENTATION CODES Per approved criteria  -Not Applicable   INTERVENTION: Ensure Complete po BID, each supplement provides 350 kcal and 13 grams of protein  NUTRITION DIAGNOSIS: Inadequate oral intake related to poor appetite, illness for the last 2 months as evidenced by Meal Completion: <25%.   Goal: Pt to meet >/= 90% of their estimated nutrition needs   Monitor:  PO intake, supplement acceptance, weight trends, labs  Reason for Assessment: Pt identified as at nutrition risk on the Malnutrition Screen Tool  68 y.o. female  Admitting Dx: PNA  ASSESSMENT: Pt with hx of RA, admitted with SOB, pt has extensive 50 year smoking hx. Per CT pt has a large right hilar mass which is concerning for lung ca. Pulmonology consulted for biopsy.  Pt with a lot of family in the room. Pt lives with her husband and son. Per family pt has been eating very poorly for the last 1 1/2 - 2 months. 24 hr recall: Breakfast: cereal or an egg, Lunch: nothing, Dinner: bites. Pt has had a poor appetite  Pt has not tried any PO supplements before but they feel she would like chocolate ensure.  Family very concerned about pt. Pt very sleepy but states she has probably lost 25 lb. Could not confirm this as she also states she usually weighs 138 lb.   Nutrition Focused Physical Exam:  Subcutaneous Fat:  Orbital Region: WDL Upper Arm Region: WDL Thoracic and Lumbar Region: WDL  Muscle:  Temple Region: WDL Clavicle Bone Region: WDL Clavicle and Acromion Bone Region: WDL Scapular Bone Region: WDL Dorsal Hand: WDL Patellar Region: WDL Anterior Thigh Region: WDL Posterior Calf Region: mild depletion   Edema: not present   Height: Ht Readings from Last 1 Encounters:  02/21/2014 5\' 2"  (1.575 m)    Weight: Wt Readings from Last 1 Encounters:  03/08/2014 133 lb (60.328 kg)    Ideal Body Weight: 50 kg   % Ideal Body Weight: 121%  Wt Readings from Last 10 Encounters:   03/07/2014 133 lb (60.328 kg)    Usual Body Weight: 138 lb   % Usual Body Weight: 96%  BMI:  Body mass index is 24.32 kg/(m^2).  Estimated Nutritional Needs: Kcal: 1600-1800 Protein: 75-90 grams Fluid: >1.6 L/day  Skin: ecchymosis  Diet Order: General  EDUCATION NEEDS: -No education needs identified at this time   Intake/Output Summary (Last 24 hours) at 03/13/14 0850 Last data filed at 03/13/14 0615  Gross per 24 hour  Intake 1251.25 ml  Output      0 ml  Net 1251.25 ml    Last BM: 10/18 per pt report, RN and MD aware   Labs:   Recent Labs Lab 02/23/2014 1227  NA 140  K 4.5  CL 94*  CO2 24  BUN 24*  CREATININE 0.54  CALCIUM 10.6*  GLUCOSE 86    CBG (last 3)  No results found for this basename: GLUCAP,  in the last 72 hours  Scheduled Meds: . amitriptyline  100 mg Oral QHS  . folic acid  1 mg Oral Daily  . gabapentin  400 mg Oral Daily  . heparin  5,000 Units Subcutaneous 3 times per day  . [START ON 02/19/2014] methotrexate  15 mg Oral Q Thu  . piperacillin-tazobactam (ZOSYN)  IV  3.375 g Intravenous Q8H  . polyethylene glycol  17 g Oral Daily  . predniSONE  30 mg Oral Q breakfast  . sodium chloride  3 mL Intravenous Q12H  .  vancomycin  500 mg Intravenous Q12H    Continuous Infusions: . sodium chloride 75 mL/hr at 02/16/2014 2043    Past Medical History  Diagnosis Date  . Rheumatoid arthritis     Past Surgical History  Procedure Laterality Date  . No past surgeries      [place], [place], [place] Pager [phone] After Hours Pager

## 2014-03-13 NOTE — Progress Notes (Signed)
Utilization review completed.  

## 2014-03-13 NOTE — Consult Note (Signed)
Name: Debbie Howe MRN: [id number] DOB: [birth year]    ADMISSION DATE:  02/27/2014 CONSULTATION DATE:  03/13/2014  REFERRING MD :  Dr Dareen Piano - IMTS  CHIEF COMPLAINT:  Dyspnea  BRIEF PATIENT DESCRIPTION: 68 year old female who presented to [facility] ED 10/28 c/o SOB and back pain with radiation to legs for 1.5 months. She has significant smoking history. CT scan in ED showed large R hilar/superhilar mass with extensive lymphadenopathy. PCCM consulted for tissue biopsy.   SIGNIFICANT EVENTS    STUDIES:  CT chest 10/27 > Large right hilar and suprahilar mass with extensive adenopathy, small R effusion, hypodensities on liver, Postobstructive PNA RUL. Compression on R pulmonary artery.    HISTORY OF PRESENT ILLNESS:  68 year old female with RA who presented with SOB. She has a 50 pack year smoking history. She was in her usual state of health until 2-3 months ago. When she developed worsening back pain. She has always had some degree of back pain, as she has had several back surgeries. Pain is describes as mid lower back with radiation to legs. Pain had become so bad that husband had to help her ambulate around the house. 1 month ago family noted a significant change in her breathing. She became easily dyspneic with normal household activities. This continued to progress, and family notices a weight loss. States she hasn't been eating and she sleeps all the time. 2 weeks ago she had a fall onto her buttocks that significantly changed her back pain. It was so painful after that she was barely able to move to bedside commode. Family states that this was worked up somewhere in [place] but they do not know the results of that workup. She [facility] c/o these symptoms. CXR in ED showed RUL consolidation prompting CT, which showed large RUL mass. She was admitted under IMTS and PCCM has been asked to see for tissue sampling.     PAST MEDICAL HISTORY :   has a past medical history of  Rheumatoid arthritis.  has past surgical history that includes No past surgeries. Prior to Admission medications   Medication Sig Start Date End Date Taking? Authorizing Provider  amitriptyline (ELAVIL) 25 MG tablet Take 100 mg by mouth daily.   Yes Historical Provider, MD  Calcium Carb-Cholecalciferol (CALCIUM 600 + D PO) Take 1 tablet by mouth daily.   Yes Historical Provider, MD  folic acid (FOLVITE) 1 MG tablet Take 1 mg by mouth daily.   Yes Historical Provider, MD  gabapentin (NEURONTIN) 400 MG capsule Take 400 mg by mouth daily.   Yes Historical Provider, MD  methotrexate (RHEUMATREX) 2.5 MG tablet Take 15 mg by mouth once a week. On Thursday. Caution:Chemotherapy. Protect from light.   Yes Historical Provider, MD  predniSONE (DELTASONE) 5 MG tablet Take 10 mg by mouth daily with breakfast.   Yes Historical Provider, MD  vitamin E 400 UNIT capsule Take 400 Units by mouth 2 (two) times daily.   Yes Historical Provider, MD   No Known Allergies  FAMILY HISTORY:  family history is not on file. SOCIAL HISTORY:  reports that she quit smoking about 14 months ago. She does not have any smokeless tobacco history on file. She reports that she drinks alcohol. She reports that she does not use illicit drugs.  REVIEW OF SYSTEMS:   Obtained from family and pt as she was lethargic at time of interview.  Bolds are positive  Constitutional: weight loss, gain, night sweats,  Fevers, chills, fatigue .  HEENT: headaches, Sore throat, sneezing, nasal congestion, post nasal drip, Difficulty swallowing, Tooth/dental problems, visual complaints visual changes, ear ache CV:  chest pain, radiates: ,Orthopnea, PND, swelling in lower extremities, dizziness, palpitations, syncope.  GI  heartburn, indigestion, abdominal pain, nausea, vomiting, diarrhea, change in bowel habits, loss of appetite, bloody stools.  Resp: cough, productive: , hemoptysis, dyspnea, chest pain, pleuritic.  Skin: rash or itching or  icterus GU: dysuria, change in color of urine, urgency or frequency. flank pain, hematuria  MS: back pain with radiation to BLE or swelling. decreased range of motion  Psych: change in mood or affect. depression or anxiety.  Neuro: difficulty with speech, weakness, numbness, ataxia   SUBJECTIVE:   VITAL SIGNS: Temp:  [98.3 F (36.8 C)-101 F (38.3 C)] 98.3 F (36.8 C) (10/28 0541) Pulse Rate:  [104-133] 125 (10/28 0541) Resp:  [17-25] 18 (10/28 0541) BP: (94-152)/(52-97) 151/80 mmHg (10/28 0541) SpO2:  [90 %-100 %] 94 % (10/28 0541) Weight:  [57.153 kg (126 lb)-60.328 kg (133 lb)] 60.328 kg (133 lb) (10/27 1902)  PHYSICAL EXAMINATION: General:  Elderly female sleeping in NAD Neuro:  Lethargic, alert to verbal, answers appropriately, follows commands, and falls back asleep. Oriented x 3 HEENT:  [place]/AT, No JVD noted Cardiovascular:  RRR, no MRG Lungs:  Diminished R apex with rhonchi, otherwise clear. Mildly labored. Abdomen: Soft, non-tender, non-distended Musculoskeletal:  No acute deformity, pain limits ROM Skin: Intact   Recent Labs Lab 02/28/2014 1227 03/13/14 0714  NA 140 136*  K 4.5 4.2  CL 94* 99  CO2 24 21  BUN 24* 15  CREATININE 0.54 0.58  GLUCOSE 86 73    Recent Labs Lab 03/07/2014 1227 03/13/14 0714  HGB 11.3* 9.3*  HCT 35.1* 29.8*  WBC 13.9* 10.1  PLT 164 121*   Ct Chest W Contrast  02/15/2014   CLINICAL DATA:  Shortness of breath. Right upper lobe consolidation.  EXAM: CT CHEST WITH CONTRAST  TECHNIQUE: Multidetector CT imaging of the chest was performed during intravenous contrast administration.  CONTRAST:  15mL OMNIPAQUE IOHEXOL 300 MG/ML  SOLN  COMPARISON:  02/14/2014; 06/18/2010  FINDINGS: Unfortunately there is extensive mediastinal and right hilar adenopathy and right hilar mass causing obstruction of the right upper lobe bronchus and postobstructive pneumonitis/ drowned lung appearance. The super hilar component of the mass is difficult to measure  given the similar density of surrounding atelectatic lung but based on the surrounding vessels is estimated to be about 5.4 by 4.1 cm. An upper right paratracheal lymph node measures 1.8 cm in short axis, image 13 series 2. A precarinal lymph node has indistinct margins but measures approximately 3 cm in short axis, image 23 series 2. Subcarinal node measures 3 cm in short axis, image 27 series 2. Infrahilar node on the right 2 cm in short axis, image 32 series 2.  There is some extrinsic mass effect on the right pulmonary artery and its branches. I cannot exclude invasion of the right pulmonary artery with bland or tumor thrombus in the right pulmonary artery on images 29 through 34 of series 2. Similar appearance on images 61 through 47 of series 6. Tumor and/or confluent nodal disease surrounds the bronchus intermedius and carina. 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 Paratracheal and right upper internal mammary adenopathy. A pathologic right axillary lymph node measures 1.7 cm in short axis, image 23 series 2.  Small right pleural effusion associated with a 0.9  by 0.7 cm pleural nodule medially along the right posterior pleura, image 45 series 2. There is also pleural nodularity further laterally at the same level measuring approximately 1.6 by 0.6 cm. Mild pleural nodularity along the right middle lobe. There is pleural nodularity along the right hemidiaphragm. Appearance compatible with malignant effusion.  Multiple abnormal hypodense lesions are present in the visualized right hepatic lobe. An index lesion in segment 8 measures 1.3 by 0.9 cm on image 49 of series 2. At least 2 other hypodense lesions are present in the liver. There is scarring in the right mid kidney anteriorly. No adrenal mass.  A superior segment right lower lobe nodule measures 0.5 by 0.4 cm on image 16 of series 3.  Bilateral C7 cervical ribs. Right supraclavicular lymph node short axis  diameter 1.6 cm, image 9 series 2. No definite osseous metastatic disease. Mild coronary artery atherosclerosis in the left main coronary.  IMPRESSION: 1. Unfortunately the patient has a large right hilar and suprahilar mass with extensive adenopathy in the mediastinum, right hilum, right infrahilar region, right axilla, and right supraclavicular region in addition to a small but malignant right pleural effusion. Appearance compatible with lung cancer, tissue diagnosis recommended. Hypodense liver lesions are not entirely specific but concerning for metastatic disease -nuclear medicine PET-CT may be helpful in determining the full extent of metastatic burden. Extensive postobstructive pneumonitis in the right upper lobe. 2. Right hilar mass is causing extrinsic compression on the right pulmonary artery to such a degree that I cannot completely exclude pulmonary arterial invasion with bland or tumor thrombus in the right pulmonary artery and its branches. Today's exam was not performed with the pulmonary embolus protocol. 3. Superior segment right lower lobe nodule, 0.5 by 0.4 cm, nonspecific but possibly metastatic. 4. 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   Electronically Signed   By: Sherryl Barters M.D.   On: 02/14/2014 16:39   Dg Chest Port 1 View  03/08/2014   CLINICAL DATA:  Shortness of breath and nausea today.  EXAM: PORTABLE CHEST - 1 VIEW  COMPARISON:  Chest CT 06/18/2010  FINDINGS: Dense right upper lobe airspace consolidation could reflect lobar pneumonia or an obstructing mass (favored). Recommend chest CT with contrast for further evaluation. The left lung is clear. No pleural effusion. The bony thorax is intact.  IMPRESSION: Dense right upper lobe airspace consolidation could reflect lobar pneumonia or obstructing mass (favored). Recommend chest CT  with contrast for further evaluation.   Electronically Signed   By: Kalman Jewels M.D.   On: 03/16/2014 13:13    ASSESSMENT / PLAN:  RUL mass Post-obstructive pneumonitis Small R effusion - Will schedule FOB for 10/29 - Light breakfast 10/29 AM then NPO - Continue empiric antibiotics - Add PRN albuterol nebs - Continue to follow cultures - Consider PCT trend to limit ABX exposure.    Georgann Housekeeper, ACNP  Pulmonology/Critical Care Pager [phone] or [phone]  PCCM ATTENDING: I have interviewed and examined the patient and reviewed the database. I have formulated the assessment and plan as reflected in the note above with amendments made by me.   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

## 2014-03-13 NOTE — Progress Notes (Signed)
Pt seen and examined with Dr. Ethelene Hal.  In brief, pt is a 67 y/o female with PMH of RA on MTX  Who p/w SOB. Per pt she hasn't been feeling well for approx 1 month because she developed LBP radiating down her legs. Pt states pain has gradually gotten worse and had x rays taken as an outpatient 1 week ago with her PCP. Pt also complained of SOB which has gradually worsened since then and had assoc night sweats and fatigue. Pt presented to ED for further w/u. She laso noted weight loss over the last month but is unable to quantify it. No HA, no blurry vision, no syncope, no hematuria, no BRBPR, no melena, no focal weakness, no abd pain, no n/v, no diarrhea. Remaining ROS negative  Exam: Gen: AAO*3, NAD CVS: tachycardic, normal heart sounds Pulm: bilateral crackles, wheezes + Abd: soft, non tender, BS + Ext: no edema  Assessment and Plan: 68 y/o female p/w SOB, back pain found to have RUL lung mass with post obstructive pneumonitis  Lung mass/post obstructive PNA: - Pulm consult noted - Pt for bronch in AM - c/w vanco/zosyn for now - monitor on tele - f/u blood cx  Hypercalcemia: - Calcium improved today with IVF. Will c/w IVF for now - Initial corrected calcium is approx 12 - Will monitor - Would check PTHRP, PTH (likely secondary to underlying malignancy) - Would obtain results of recent x rays done as an outpatient - possible bony mets - c/w  Pain control  Thrombocytopenia/anemia: - decreased platelet count and worsening anemia on today's labs (Hg decreased to 9.3 from 11.3) - Likely dilutional given IVF. ? Baseline. Will obtain records from PCP - It is also possible given anemia, back pain and hypercalcemia that pt may have myeloma but unlikely given large lung mass consistent with primary lung cancer Will consider further w/u if x rays with lytic lesions

## 2014-03-13 NOTE — H&P (Signed)
Pt seen and examined with Dr. Ethelene Hal. I agree with documentation as outlined. Please refer to my progress note from today for further details

## 2014-03-13 NOTE — Progress Notes (Signed)
Subjective: Debbie Howe had no acute events overnight. She says the vicodin is helping with her back pain but when it wears off it is unchanged from the past month. She reports subjective fevers and a dry cough but denies any shortness of breath as she has been resting upright in bed or chest pain.  Objective: Vital signs in last 24 hours: Filed Vitals:   02/18/2014 1902 02/21/2014 1904 02/16/2014 2106 03/13/14 0541  BP:  126/76 115/66 151/80  Pulse:  104 112 125  Temp:   98.7 F (37.1 C) 98.3 F (36.8 C)  TempSrc:  Oral Oral Oral  Resp:   20 18  Height: 5\' 2"  (1.575 m)     Weight: 133 lb (60.328 kg)     SpO2:  93% 100% 94%   Weight change:   Intake/Output Summary (Last 24 hours) at 03/13/14 1345 Last data filed at 03/13/14 0900  Gross per 24 hour  Intake 1351.25 ml  Output      0 ml  Net 1351.25 ml   Gen: A&O x 4, no acute distress, thin  HEENT: Atraumatic, PERRL, EOMI, sclerae anicteric, less dry mucous membranes  Heart: tachycardic but regular, normal S1 S2, no murmurs, rubs, or gallops  Lungs: bilateral inspiratory wheezes with basilar crackles, respirations unlabored  Abd: Soft, non-tender, non-distended, + bowel sounds, no hepatosplenomegaly  Ext: No edema or cyanosis  Lab Results: Basic Metabolic Panel:  Recent Labs Lab 02/26/2014 1227 03/13/14 0714  NA 140 136*  K 4.5 4.2  CL 94* 99  CO2 24 21  GLUCOSE 86 73  BUN 24* 15  CREATININE 0.54 0.58  CALCIUM 10.6* 9.1   Liver Function Tests:  Recent Labs Lab 03/01/2014 1227 03/13/14 0714  AST 76* 83*  ALT 18 16  ALKPHOS 140* 142*  BILITOT 0.4 0.4  PROT 7.2 6.3  ALBUMIN 2.2* 1.7*   CBC:  Recent Labs Lab 03/04/2014 1227 03/13/14 0714  WBC 13.9* 10.1  NEUTROABS 11.2*  --   HGB 11.3* 9.3*  HCT 35.1* 29.8*  MCV 94.9 95.2  PLT 164 121*   BNP:  Recent Labs Lab 02/27/2014 1515  PROBNP 769.4*   Urinalysis:  Recent Labs Lab 02/17/2014 1401  COLORURINE AMBER*  LABSPEC 1.017  PHURINE 6.0  GLUCOSEU  NEGATIVE  HGBUR NEGATIVE  BILIRUBINUR MODERATE*  KETONESUR 40*  PROTEINUR NEGATIVE  UROBILINOGEN 1.0  NITRITE NEGATIVE  LEUKOCYTESUR SMALL*  many bacteria, many squamous epithelial cells, hyaline casts  Micro Results: Recent Results (from the past 240 hour(s))  CULTURE, BLOOD (ROUTINE X 2)     Status: None   Collection Time    03/02/2014  2:20 PM      Result Value Ref Range Status   Specimen Description BLOOD RIGHT ANTECUBITAL   Final   Special Requests BOTTLES DRAWN AEROBIC AND ANAEROBIC 5MLS   Final   Culture  Setup Time     Final   Value: 02/14/2014 20:52     Performed at [facility]   Culture     Final   Value:        BLOOD CULTURE RECEIVED NO GROWTH TO DATE CULTURE WILL BE HELD FOR 5 DAYS BEFORE ISSUING A FINAL NEGATIVE REPORT     Performed at [facility]   Report Status PENDING   Incomplete  CULTURE, BLOOD (ROUTINE X 2)     Status: None   Collection Time    02/14/2014  3:15 PM      Result Value Ref Range  Status   Specimen Description BLOOD RIGHT ANTECUBITAL   Final   Special Requests BOTTLES DRAWN AEROBIC AND ANAEROBIC 10MLS   Final   Culture  Setup Time     Final   Value: 03/07/2014 20:53     Performed at [facility]   Culture     Final   Value:        BLOOD CULTURE RECEIVED NO GROWTH TO DATE CULTURE WILL BE HELD FOR 5 DAYS BEFORE ISSUING A FINAL NEGATIVE REPORT     Performed at [facility]   Report Status PENDING   Incomplete   Studies/Results: Ct Chest W Contrast  03/08/2014   CLINICAL DATA:  Shortness of breath. Right upper lobe consolidation.  EXAM: CT CHEST WITH CONTRAST  TECHNIQUE: Multidetector CT imaging of the chest was performed during intravenous contrast administration.  CONTRAST:  42mL OMNIPAQUE IOHEXOL 300 MG/ML  SOLN  COMPARISON:  03/05/2014; 06/18/2010  FINDINGS: Unfortunately there is extensive mediastinal and right hilar adenopathy and right hilar mass causing obstruction of the right upper lobe bronchus and  postobstructive pneumonitis/ drowned lung appearance. The super hilar component of the mass is difficult to measure given the similar density of surrounding atelectatic lung but based on the surrounding vessels is estimated to be about 5.4 by 4.1 cm. An upper right paratracheal lymph node measures 1.8 cm in short axis, image 13 series 2. A precarinal lymph node has indistinct margins but measures approximately 3 cm in short axis, image 23 series 2. Subcarinal node measures 3 cm in short axis, image 27 series 2. Infrahilar node on the right 2 cm in short axis, image 32 series 2.  There is some extrinsic mass effect on the right pulmonary artery and its branches. I cannot exclude invasion of the right pulmonary artery with bland or tumor thrombus in the right pulmonary artery on images 29 through 34 of series 2. Similar appearance on images 61 through 47 of series 6. Tumor and/or confluent nodal disease surrounds the bronchus intermedius and carina. 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 Paratracheal and right upper internal mammary adenopathy. A pathologic right axillary lymph node measures 1.7 cm in short axis, image 23 series 2.  Small right pleural effusion associated with a 0.9 by 0.7 cm pleural nodule medially along the right posterior pleura, image 45 series 2. There is also pleural nodularity further laterally at the same level measuring approximately 1.6 by 0.6 cm. Mild pleural nodularity along the right middle lobe. There is pleural nodularity along the right hemidiaphragm. Appearance compatible with malignant effusion.  Multiple abnormal hypodense lesions are present in the visualized right hepatic lobe. An index lesion in segment 8 measures 1.3 by 0.9 cm on image 49 of series 2. At least 2 other hypodense lesions are present in the liver. There is scarring in the right mid kidney anteriorly. No adrenal mass.  A superior segment right lower lobe nodule measures 0.5  by 0.4 cm on image 16 of series 3.  Bilateral C7 cervical ribs. Right supraclavicular lymph node short axis diameter 1.6 cm, image 9 series 2. No definite osseous metastatic disease. Mild coronary artery atherosclerosis in the left main coronary.  IMPRESSION: 1. Unfortunately the patient has a large right hilar and suprahilar mass with extensive adenopathy in the mediastinum, right hilum, right infrahilar region, right axilla, and right supraclavicular region in addition to a small but malignant right pleural effusion. Appearance compatible with lung cancer, tissue diagnosis recommended.  Hypodense liver lesions are not entirely specific but concerning for metastatic disease -nuclear medicine PET-CT may be helpful in determining the full extent of metastatic burden. Extensive postobstructive pneumonitis in the right upper lobe. 2. Right hilar mass is causing extrinsic compression on the right pulmonary artery to such a degree that I cannot completely exclude pulmonary arterial invasion with bland or tumor thrombus in the right pulmonary artery and its branches. Today's exam was not performed with the pulmonary embolus protocol. 3. Superior segment right lower lobe nodule, 0.5 by 0.4 cm, nonspecific but possibly metastatic. 4. 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   Electronically Signed   By: Sherryl Barters M.D.   On: 03/06/2014 16:39   Dg Chest Port 1 View  02/20/2014   CLINICAL DATA:  Shortness of breath and nausea today.  EXAM: PORTABLE CHEST - 1 VIEW  COMPARISON:  Chest CT 06/18/2010  FINDINGS: Dense right upper lobe airspace consolidation could reflect lobar pneumonia or an obstructing mass (favored). Recommend chest CT with contrast for further evaluation. The left lung is clear. No pleural effusion. The bony thorax is intact.  IMPRESSION: Dense right upper  lobe airspace consolidation could reflect lobar pneumonia or obstructing mass (favored). Recommend chest CT with contrast for further evaluation.   Electronically Signed   By: Kalman Jewels M.D.   On: 03/01/2014 13:13   Medications: I have reviewed the patient's current medications. Scheduled Meds: . amitriptyline  100 mg Oral QHS  . feeding supplement (ENSURE COMPLETE)  237 mL Oral BID BM  . folic acid  1 mg Oral Daily  . gabapentin  400 mg Oral Daily  . heparin  5,000 Units Subcutaneous 3 times per day  . [START ON 03/13/2014] methotrexate  15 mg Oral Q Thu  . piperacillin-tazobactam (ZOSYN)  IV  3.375 g Intravenous Q8H  . polyethylene glycol  17 g Oral Daily  . predniSONE  30 mg Oral Q breakfast  . sodium chloride  3 mL Intravenous Q12H  . vancomycin  500 mg Intravenous Q12H   Continuous Infusions: . sodium chloride 75 mL/hr at 03/15/2014 2043   PRN Meds:.albuterol, bisacodyl, HYDROcodone-acetaminophen Assessment/Plan: Active Problems:   Pneumonia  #Thoracic Mass: Large R hilar mass is likely responsible for SOB. She may have superimposed pneumonia secondary to obstructive effect or the mass is responsible for leukocytosis, fever, tachycardia. She has been afebrile overnight with WBC trending down from 13.9 on presentation to 10.1 since beginning vanc/zosyn. She is sating in the mid 90s on 2L nasal cannula. Pulmonology was called this morning to consult regarding tissue sample for likely cancer. They plan for FOB tomorrow. 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  -appreciate pulm -light breakfast 10/29 am then NPO for FOB -vanc/zosyn per pharmacy  -NS @ 75 cc/hr  -f/u BCx x 2  -cardiac monitoring   #Rheumatoid Arthritis: Debbie Stang is on methotrexate 15 mg weekly on Thursdays and prednisone 10 mg daily. This may explain her back pain versus metastasis. We will try to obtain back X-ray taken a week ago by PCP. -cont  methotrexate 15 mg po weekly on Thursdays  -prednisone 30 mg daily x 3 days as stress dose  -vicodin 5-325 po q6hprn  -check GGT  #s/p orthopedic neck surgery: Patient and significant other report she had neck surgery several years ago but could not describe further with  no known complications  -cont gabapentin 400 mg daily presumed for this diagnsois   #Insomnia: Patient reports that she takes amitriptyline 100 mg daily for insomnia but question whether she is not forthcoming with any past psych history  -continue amitripyline 100 mg daily   #Asymptomatic bacteruria: Patient has many bacteria on UA but denies dysuria. She is immunosuppressed and we have begun abx for possible postobstructive PNA  -cont abx per above   #Diet: regular   #DVT ppx: heparin 5000 u tid, SCD  Dispo: Disposition is deferred at this time, awaiting improvement of current medical problems.  Anticipated discharge in approximately 2 day(s).   The patient does have a current PCP Charletta Cousin, MD) and does need an [facility] hospital follow-up appointment after discharge.  The patient does not know have transportation limitations that hinder transportation to clinic appointments.  .Services Needed at time of discharge: Y = Yes, Blank = No PT:   OT:   RN:   Equipment:   Other:     LOS: 1 day   Kelby Aline, MD 03/13/2014, 1:45 PM

## 2014-03-14 ENCOUNTER — Inpatient Hospital Stay (HOSPITAL_COMMUNITY): Payer: Medicare Other

## 2014-03-14 ENCOUNTER — Encounter (HOSPITAL_COMMUNITY): Admission: EM | Disposition: E | Payer: Self-pay | Source: Home / Self Care | Attending: Internal Medicine

## 2014-03-14 DIAGNOSIS — C801 Malignant (primary) neoplasm, unspecified: Secondary | ICD-10-CM

## 2014-03-14 DIAGNOSIS — Z720 Tobacco use: Secondary | ICD-10-CM

## 2014-03-14 DIAGNOSIS — K769 Liver disease, unspecified: Secondary | ICD-10-CM

## 2014-03-14 DIAGNOSIS — J90 Pleural effusion, not elsewhere classified: Secondary | ICD-10-CM

## 2014-03-14 DIAGNOSIS — M549 Dorsalgia, unspecified: Secondary | ICD-10-CM

## 2014-03-14 DIAGNOSIS — R0602 Shortness of breath: Secondary | ICD-10-CM

## 2014-03-14 HISTORY — DX: Malignant (primary) neoplasm, unspecified: C80.1

## 2014-03-14 HISTORY — PX: VIDEO BRONCHOSCOPY: SHX5072

## 2014-03-14 LAB — CBC
HCT: 28.4 % — ABNORMAL LOW (ref 36.0–46.0)
Hemoglobin: 8.9 g/dL — ABNORMAL LOW (ref 12.0–15.0)
MCH: 29.2 pg (ref 26.0–34.0)
MCHC: 31.3 g/dL (ref 30.0–36.0)
MCV: 93.1 fL (ref 78.0–100.0)
PLATELETS: 109 10*3/uL — AB (ref 150–400)
RBC: 3.05 MIL/uL — ABNORMAL LOW (ref 3.87–5.11)
RDW: 16.5 % — ABNORMAL HIGH (ref 11.5–15.5)
WBC: 11 10*3/uL — ABNORMAL HIGH (ref 4.0–10.5)

## 2014-03-14 LAB — BASIC METABOLIC PANEL
ANION GAP: 14 (ref 5–15)
BUN: 11 mg/dL (ref 6–23)
CALCIUM: 9.5 mg/dL (ref 8.4–10.5)
CO2: 27 mEq/L (ref 19–32)
CREATININE: 0.52 mg/dL (ref 0.50–1.10)
Chloride: 103 mEq/L (ref 96–112)
GFR calc Af Amer: >90 mL/min (ref >90)
Glucose, Bld: 85 mg/dL (ref 70–99)
Potassium: 3.3 mEq/L — ABNORMAL LOW (ref 3.7–5.3)
SODIUM: 144 meq/L (ref 137–147)

## 2014-03-14 LAB — IRON AND TIBC
Iron: 67 ug/dL (ref 42–135)
Saturation Ratios: 41 % (ref 20–55)
TIBC: 162 ug/dL — ABNORMAL LOW (ref 250–470)
UIBC: 95 ug/dL — ABNORMAL LOW (ref 125–400)

## 2014-03-14 LAB — GAMMA GT: GGT: 32 U/L (ref 7–51)

## 2014-03-14 LAB — RETICULOCYTES
RBC.: 3.27 MIL/uL — ABNORMAL LOW (ref 3.87–5.11)
RETIC COUNT ABSOLUTE: 32.7 10*3/uL (ref 19.0–186.0)
Retic Ct Pct: 1 % (ref 0.4–3.1)

## 2014-03-14 SURGERY — VIDEO BRONCHOSCOPY WITHOUT FLUORO
Anesthesia: Moderate Sedation | Laterality: Bilateral

## 2014-03-14 MED ORDER — HYDROMORPHONE HCL 1 MG/ML IJ SOLN
0.5000 mg | Freq: Once | INTRAMUSCULAR | Status: AC | PRN
  Administered 2014-03-14: 0.5 mg via INTRAVENOUS
  Filled 2014-03-14: qty 1

## 2014-03-14 MED ORDER — FENTANYL CITRATE 0.05 MG/ML IJ SOLN
INTRAMUSCULAR | Status: AC
  Filled 2014-03-14: qty 4

## 2014-03-14 MED ORDER — POTASSIUM CHLORIDE CRYS ER 20 MEQ PO TBCR
40.0000 meq | EXTENDED_RELEASE_TABLET | Freq: Once | ORAL | Status: AC
  Administered 2014-03-14: 40 meq via ORAL
  Filled 2014-03-14 (×2): qty 2

## 2014-03-14 MED ORDER — LIDOCAINE HCL 2 % EX GEL
CUTANEOUS | Status: DC | PRN
  Administered 2014-03-14: 1

## 2014-03-14 MED ORDER — PHENYLEPHRINE HCL 0.25 % NA SOLN
NASAL | Status: DC | PRN
  Administered 2014-03-14: 2 via NASAL

## 2014-03-14 MED ORDER — ALBUTEROL SULFATE (2.5 MG/3ML) 0.083% IN NEBU
2.5000 mg | INHALATION_SOLUTION | Freq: Four times a day (QID) | RESPIRATORY_TRACT | Status: DC
  Administered 2014-03-14 – 2014-03-16 (×8): 2.5 mg via RESPIRATORY_TRACT
  Filled 2014-03-14 (×10): qty 3

## 2014-03-14 MED ORDER — MIDAZOLAM HCL 10 MG/2ML IJ SOLN
INTRAMUSCULAR | Status: DC | PRN
  Administered 2014-03-14: 2 mg via INTRAVENOUS

## 2014-03-14 MED ORDER — LIDOCAINE HCL (PF) 1 % IJ SOLN
INTRAMUSCULAR | Status: DC | PRN
  Administered 2014-03-14: 6 mL

## 2014-03-14 MED ORDER — HYDROCODONE-ACETAMINOPHEN 5-325 MG PO TABS
1.0000 | ORAL_TABLET | ORAL | Status: DC | PRN
  Administered 2014-03-15 – 2014-03-17 (×6): 1 via ORAL
  Filled 2014-03-14 (×6): qty 1

## 2014-03-14 MED ORDER — MIDAZOLAM HCL 5 MG/ML IJ SOLN
INTRAMUSCULAR | Status: AC
  Filled 2014-03-14: qty 2

## 2014-03-14 MED ORDER — HYDROCODONE-ACETAMINOPHEN 5-325 MG PO TABS: 1.0000 | ORAL_TABLET | Freq: Once | ORAL | Status: DC | PRN

## 2014-03-14 MED ORDER — FENTANYL CITRATE 0.05 MG/ML IJ SOLN
INTRAMUSCULAR | Status: DC | PRN
  Administered 2014-03-14: 50 ug via INTRAVENOUS

## 2014-03-14 NOTE — Progress Notes (Signed)
Subjective: Debbie Howe had no acute events overnight. She is waiting for her bronchoscopy this morning. She still complains about her back pain. I checked her MAR and she only got two doses of vicodin yesterday so I told her to ask for it if she is uncomfortable. She also still has SOB but has not asked for any albuterol treatments. She said she was not aware they were available so I clarified that we can schedule them.  Objective: Vital signs in last 24 hours: Filed Vitals:   03/13/14 2007 03/13/14 2103 02/16/2014 0604 02/14/2014 0931  BP: 142/86 148/77 142/79   Pulse: 99 107 103   Temp: 98.7 F (37.1 C) 98 F (36.7 C) 97.9 F (36.6 C)   TempSrc: Oral Oral Oral   Resp: 18 18 18    Height:      Weight:      SpO2: 92% 95% 97% 97%   Weight change:   Intake/Output Summary (Last 24 hours) at 03/02/2014 1021 Last data filed at 03/11/2014 0959  Gross per 24 hour  Intake 2168.75 ml  Output    400 ml  Net 1768.75 ml   Gen: A&O x 4, no acute distress, thin  HEENT: Atraumatic, PERRL, EOMI, sclerae anicteric, dry mucous membranes  Heart: tachycardic but regular, normal S1 S2, no murmurs, rubs, or gallops  Lungs: bilateral inspiratory wheezes with basilar crackles unchanged, respirations unlabored  Abd: Soft, non-tender, non-distended, + bowel sounds, no hepatosplenomegaly  Ext: No edema or cyanosis  Lab Results: Basic Metabolic Panel:  Recent Labs Lab 03/13/14 0714 02/19/2014 0545  NA 136* 144  K 4.2 3.3*  CL 99 103  CO2 21 27  GLUCOSE 73 85  BUN 15 11  CREATININE 0.58 0.52  CALCIUM 9.1 9.5   Liver Function Tests:  Recent Labs Lab 03/04/2014 1227 03/13/14 0714  AST 76* 83*  ALT 18 16  ALKPHOS 140* 142*  BILITOT 0.4 0.4  PROT 7.2 6.3  ALBUMIN 2.2* 1.7*   CBC:  Recent Labs Lab 03/13/2014 1227 03/13/14 0714 02/26/2014 0545  WBC 13.9* 10.1 11.0*  NEUTROABS 11.2*  --   --   HGB 11.3* 9.3* 8.9*  HCT 35.1* 29.8* 28.4*  MCV 94.9 95.2 93.1  PLT 164 121* 109*   BNP:  Recent  Labs Lab 02/20/2014 1515  PROBNP 769.4*   Urinalysis:  Recent Labs Lab 03/07/2014 1401  COLORURINE AMBER*  LABSPEC 1.017  PHURINE 6.0  GLUCOSEU NEGATIVE  HGBUR NEGATIVE  BILIRUBINUR MODERATE*  KETONESUR 40*  PROTEINUR NEGATIVE  UROBILINOGEN 1.0  NITRITE NEGATIVE  LEUKOCYTESUR SMALL*  many bacteria, many squamous epithelial cells, hyaline casts GGT: 32  Micro Results: Recent Results (from the past 240 hour(s))  URINE CULTURE     Status: None   Collection Time    03/04/2014  2:01 PM      Result Value Ref Range Status   Specimen Description URINE, CLEAN CATCH   Final   Special Requests NONE   Final   Culture  Setup Time     Final   Value: 03/09/2014 21:10     Performed at [facility] Count     Final   Value: >=100,000 COLONIES/ML     Performed at [facility]   Culture     Final   Value: Multiple bacterial morphotypes present, none predominant. Suggest appropriate recollection if clinically indicated.     Performed at [facility]   Report Status 03/13/2014 FINAL   Final  CULTURE, BLOOD (ROUTINE X 2)     Status: None   Collection Time    03/09/2014  2:20 PM      Result Value Ref Range Status   Specimen Description BLOOD RIGHT ANTECUBITAL   Final   Special Requests BOTTLES DRAWN AEROBIC AND ANAEROBIC 5MLS   Final   Culture  Setup Time     Final   Value: 02/15/2014 20:52     Performed at [facility]   Culture     Final   Value:        BLOOD CULTURE RECEIVED NO GROWTH TO DATE CULTURE WILL BE HELD FOR 5 DAYS BEFORE ISSUING A FINAL NEGATIVE REPORT     Performed at [facility]   Report Status PENDING   Incomplete  CULTURE, BLOOD (ROUTINE X 2)     Status: None   Collection Time    03/03/2014  3:15 PM      Result Value Ref Range Status   Specimen Description BLOOD RIGHT ANTECUBITAL   Final   Special Requests BOTTLES DRAWN AEROBIC AND ANAEROBIC 10MLS   Final   Culture  Setup Time     Final   Value: 02/18/2014 20:53      Performed at [facility]   Culture     Final   Value:        BLOOD CULTURE RECEIVED NO GROWTH TO DATE CULTURE WILL BE HELD FOR 5 DAYS BEFORE ISSUING A FINAL NEGATIVE REPORT     Performed at [facility]   Report Status PENDING   Incomplete   Studies/Results: Ct Chest W Contrast  02/14/2014   CLINICAL DATA:  Shortness of breath. Right upper lobe consolidation.  EXAM: CT CHEST WITH CONTRAST  TECHNIQUE: Multidetector CT imaging of the chest was performed during intravenous contrast administration.  CONTRAST:  38m OMNIPAQUE IOHEXOL 300 MG/ML  SOLN  COMPARISON:  03/08/2014; 06/18/2010  FINDINGS: Unfortunately there is extensive mediastinal and right hilar adenopathy and right hilar mass causing obstruction of the right upper lobe bronchus and postobstructive pneumonitis/ drowned lung appearance. The super hilar component of the mass is difficult to measure given the similar density of surrounding atelectatic lung but based on the surrounding vessels is estimated to be about 5.4 by 4.1 cm. An upper right paratracheal lymph node measures 1.8 cm in short axis, image 13 series 2. A precarinal lymph node has indistinct margins but measures approximately 3 cm in short axis, image 23 series 2. Subcarinal node measures 3 cm in short axis, image 27 series 2. Infrahilar node on the right 2 cm in short axis, image 32 series 2.  There is some extrinsic mass effect on the right pulmonary artery and its branches. I cannot exclude invasion of the right pulmonary artery with bland or tumor thrombus in the right pulmonary artery on images 29 through 34 of series 2. Similar appearance on images 61 through 47 of series 6. Tumor and/or confluent nodal disease surrounds the bronchus intermedius and carina. 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 Paratracheal and right upper internal mammary adenopathy. A pathologic right axillary lymph node measures 1.7 cm in short axis,  image 23 series 2.  Small right pleural effusion associated with a 0.9 by 0.7 cm pleural nodule medially along the right posterior pleura, image 45 series 2. There is also pleural nodularity further laterally at the same level measuring approximately 1.6 by 0.6 cm. Mild pleural nodularity along the right middle  lobe. There is pleural nodularity along the right hemidiaphragm. Appearance compatible with malignant effusion.  Multiple abnormal hypodense lesions are present in the visualized right hepatic lobe. An index lesion in segment 8 measures 1.3 by 0.9 cm on image 49 of series 2. At least 2 other hypodense lesions are present in the liver. There is scarring in the right mid kidney anteriorly. No adrenal mass.  A superior segment right lower lobe nodule measures 0.5 by 0.4 cm on image 16 of series 3.  Bilateral C7 cervical ribs. Right supraclavicular lymph node short axis diameter 1.6 cm, image 9 series 2. No definite osseous metastatic disease. Mild coronary artery atherosclerosis in the left main coronary.  IMPRESSION: 1. Unfortunately the patient has a large right hilar and suprahilar mass with extensive adenopathy in the mediastinum, right hilum, right infrahilar region, right axilla, and right supraclavicular region in addition to a small but malignant right pleural effusion. Appearance compatible with lung cancer, tissue diagnosis recommended. Hypodense liver lesions are not entirely specific but concerning for metastatic disease -nuclear medicine PET-CT may be helpful in determining the full extent of metastatic burden. Extensive postobstructive pneumonitis in the right upper lobe. 2. Right hilar mass is causing extrinsic compression on the right pulmonary artery to such a degree that I cannot completely exclude pulmonary arterial invasion with bland or tumor thrombus in the right pulmonary artery and its branches. Today's exam was not performed with the pulmonary embolus protocol. 3. Superior segment right  lower lobe nodule, 0.5 by 0.4 cm, nonspecific but possibly metastatic. 4. 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   Electronically Signed   By: Sherryl Barters M.D.   On: 03/08/2014 16:39   Dg Chest Port 1 View  03/13/2014   CLINICAL DATA:  Shortness of breath and nausea today.  EXAM: PORTABLE CHEST - 1 VIEW  COMPARISON:  Chest CT 06/18/2010  FINDINGS: Dense right upper lobe airspace consolidation could reflect lobar pneumonia or an obstructing mass (favored). Recommend chest CT with contrast for further evaluation. The left lung is clear. No pleural effusion. The bony thorax is intact.  IMPRESSION: Dense right upper lobe airspace consolidation could reflect lobar pneumonia or obstructing mass (favored). Recommend chest CT with contrast for further evaluation.   Electronically Signed   By: Kalman Jewels M.D.   On: 03/11/2014 13:13   Medications: I have reviewed the patient's current medications. Scheduled Meds: . albuterol  2.5 mg Nebulization Q6H  . amitriptyline  100 mg Oral QHS  . feeding supplement (ENSURE COMPLETE)  237 mL Oral BID BM  . folic acid  1 mg Oral Daily  . gabapentin  400 mg Oral Daily  . heparin  5,000 Units Subcutaneous 3 times per day  . methotrexate  15 mg Oral Q Thu  . piperacillin-tazobactam (ZOSYN)  IV  3.375 g Intravenous Q8H  . polyethylene glycol  17 g Oral Daily  . potassium chloride  40 mEq Oral Once  . predniSONE  30 mg Oral Q breakfast  . sodium chloride  3 mL Intravenous Q12H  . vancomycin  500 mg Intravenous Q12H   Continuous Infusions: . sodium chloride 75 mL/hr at 03/11/2014 0248   PRN Meds:.bisacodyl, HYDROcodone-acetaminophen Assessment/Plan: Active Problems:   Pneumonia  #Thoracic Mass: Large R hilar mass is likely responsible for SOB. She may have superimposed pneumonia secondary to obstructive effect or  the mass is responsible for leukocytosis,  fever, tachycardia. She has been afebrile overnight with WBC trending down from 13.9 on presentation to 11 today up from 10.1 yesterday, all since beginning vanc/zosyn. She is sating in the mid 90s on 2L nasal cannula. Pulmonology will perform bronch biopsy this morning/afternoon. 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 She also has hypercalcemia with adjusted Ca 11.3 (taking into account hypoalbuminemia).  There is concern back pain may be mets (see below) -appreciate pulm  -check vit d 1,25 dihydroxy, vit d 25 hydroxy, PTH related peptide, PTH intact and calcium -light breakfast 10/29 am then NPO for FOB -vanc/zosyn per pharmacy  -schedule albuterol 2.5 mg neb q6h for symptom relief as she is not requesting PRN -NS @ 75 cc/hr  -f/u BCx x 2  -cardiac monitoring   #Back Pain: We are concerned that this may be metastases rather than related to her RA. Her alkaline phosphatase was elevated at 142 and her GGT is normal at 32. She says she got a recent back X-ray from her PCP which only showed degenerative changes. We requested the record but have not yet received it. She also has hypercalcemia with adjusted Ca 11.3 (taking into account hypoalbuminemia).  -MR lumbar spine w wo contrast -check vit d 1,25 dihydroxy, vit d 25 hydroxy, PTH related peptide, PTH intact and calcium -reattempt to get old records from PCP -vicodin 5-325 po q6hprn   #Rheumatoid Arthritis: Debbie Howe is on methotrexate 15 mg weekly on Thursdays and prednisone 10 mg daily. This may explain her back pain versus metastasis. We are getting spine MR given elevated alk phos and normal GGT in setting of progressive back pain but will try again to obtain back X-ray taken a week ago by PCP. -cont methotrexate 15 mg po weekly on Thursdays  -prednisone 30 mg daily x 3 days as stress dose  -vicodin 5-325 po q6hprn  -MR lumbar spine w  wo contrast  #Anemia: Hemoglobin on presentation 11.3 now 8.9. Patient hemodynamically stable w no obvious bleeding. No anemia w/u in our system -anemia panel -FOBC -cont to monitor  #Hypokalemia: Etiology unclear but K this morning 3.3 down from 4.5 on presentation -KDur 40 mEq in afternoon after bronch -cont to monitor  #s/p orthopedic neck surgery: Patient and significant other report she had neck surgery several years ago but could not describe further with no known complications  -cont gabapentin 400 mg daily presumed for this diagnsois   #Insomnia: Patient reports that she takes amitriptyline 100 mg daily for insomnia but question whether she is not forthcoming with any past psych history  -continue amitripyline 100 mg daily   #Asymptomatic bacteruria: Patient has many bacterial morphotypes with non predominant on UA but denies dysuria. She is immunosuppressed and we have begun abx for possible postobstructive PNA. This is likely contaminant. -cont abx per above   #Diet: NPO for bronch then regular  #DVT ppx: heparin 5000 u tid, SCD  Dispo: Disposition is deferred at this time, awaiting improvement of current medical problems.  Anticipated discharge in approximately 2 day(s).   The patient does have a current PCP Charletta Cousin, MD) and does need an [facility] follow-up appointment after discharge.  The patient does not know have transportation limitations that hinder transportation to clinic appointments.  .Services Needed at time of discharge: Y = Yes, Blank = No PT:   OT:   RN:   Equipment:   Other:     LOS: 2 days   Elizabelle Fite  Rosezella Florida, MD 03/16/2014, 10:21 AM

## 2014-03-14 NOTE — Procedures (Addendum)
Indication:   Lung mass   Sedation:  Fentanyl 50 mcg Midazolam 2 mg  Anesthesia: Topical anesthesia to nose and nebulized  Procedure: After adequate sedation and anesthesia, the bronchoscope was introduced via the R naris and advanced into the posterior pharynx. Further anesthesia was obtained with 1% lidocaine and the scope was advanced into the trachea. Complete airway anesthesia was achieved with 1% lidocaine and a thorough airway examination was performed. This revealed the following.  Findings:  Main carina slightly splayed and edematous Normal airway exam on L except mild bronchitic changes Entire R bronchial tree mucosa extensively studded with tumor infiltration/cobblestoning (see pic) RUL and RML bronchi are nearly totally obstructed with endobronchial tumor and extrinsic compression  Specimens:   Cytology brushings from RML and RUL EBBx from RUL bronchus and a nodule from the R mianstem TBNA targeting pretracheal and subtracheal lymph nodes  All specimens sent for cytology and surg path  Post procedure evaluation:  The patient tolerated the procedure well with no major complications. There was mild bleeding that had stopped by the time of removal of the bronchoscope  REC: Oncology consult if not already requested Complete staging for bronchogenic Ca  Merton Border, MD;  PCCM service; [place] [phone]

## 2014-03-14 NOTE — Progress Notes (Signed)
Pt seen and examined with Dr. Ethelene Hal. Please refer to resident note for details  Pt still with persistent back pain. She also c/o SOB/wheezing this morning but appears well currently  Exam:  Gen: AAO*3, NAD  CVS: tachycardic, normal heart sounds  Pulm: bilateral crackles, wheezes +  Abd: soft, non tender, BS +  Ext: no edema   Assessment and Plan:  68 y/o female p/w SOB, back pain found to have RUL lung mass with post obstructive pneumonitis   Lung mass/post obstructive PNA:  - Pulm f/u noted- pt s/p bronchoscopy today with tumor infiltration of R bronchial tree and RUL/RML near total occlusion from endobronchial tunor extrinsic compression - Will get onc eval once path resulted - Will need outpatient PET scan  - Will check MRI L spine given severe pain and possibility of mets - c/w vanco/zosyn for now. Will consider d/c vanco in AM - monitor on tele  - f/u blood cx - NGTD  Hypercalcemia:  - Calcium improving with IVF  - Will monitor daily - Would check PTHRP, PTH (likely secondary to underlying malignancy)  - Would obtain results of recent x rays done as an outpatient - possible bony mets  - c/w Pain control   Thrombocytopenia/anemia:  - decreased platelet count and worsening anemia on today's labs (Hg decreased to 9.3 from 11.3)  - Likely dilutional given IVF. ? Baseline. Will obtain records from PCP  - f/u anemia panel - It is also possible given anemia, back pain and hypercalcemia that pt may have myeloma but unlikely given large lung mass consistent with primary lung cancer  Will f/u path results. No further w/u for now
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
## 2014-03-14 NOTE — Progress Notes (Signed)
Video bronchoscopy procedure performed. Bronch washing intervention performed. Transbronchial biopsy intervention performed. Wang needle biopsy intervention performed.  Merton Border, MD ; [facility] [phone].  After 5:30 PM or weekends, call [phone]

## 2014-03-15 ENCOUNTER — Inpatient Hospital Stay (HOSPITAL_COMMUNITY): Payer: Medicare Other

## 2014-03-15 ENCOUNTER — Encounter (HOSPITAL_COMMUNITY): Payer: Self-pay | Admitting: Pulmonary Disease

## 2014-03-15 DIAGNOSIS — J189 Pneumonia, unspecified organism: Secondary | ICD-10-CM

## 2014-03-15 DIAGNOSIS — C801 Malignant (primary) neoplasm, unspecified: Secondary | ICD-10-CM

## 2014-03-15 DIAGNOSIS — E876 Hypokalemia: Secondary | ICD-10-CM

## 2014-03-15 DIAGNOSIS — R109 Unspecified abdominal pain: Secondary | ICD-10-CM

## 2014-03-15 LAB — CBC
HCT: 28.2 % — ABNORMAL LOW (ref 36.0–46.0)
HEMOGLOBIN: 9 g/dL — AB (ref 12.0–15.0)
MCH: 29.8 pg (ref 26.0–34.0)
MCHC: 31.9 g/dL (ref 30.0–36.0)
MCV: 93.4 fL (ref 78.0–100.0)
Platelets: 111 10*3/uL — ABNORMAL LOW (ref 150–400)
RBC: 3.02 MIL/uL — AB (ref 3.87–5.11)
RDW: 16.5 % — ABNORMAL HIGH (ref 11.5–15.5)
WBC: 10.4 10*3/uL (ref 4.0–10.5)

## 2014-03-15 LAB — BASIC METABOLIC PANEL
Anion gap: 13 (ref 5–15)
BUN: 12 mg/dL (ref 6–23)
CO2: 26 meq/L (ref 19–32)
Calcium: 9.6 mg/dL (ref 8.4–10.5)
Chloride: 106 mEq/L (ref 96–112)
Creatinine, Ser: 0.57 mg/dL (ref 0.50–1.10)
GFR calc Af Amer: >90 mL/min (ref >90)
GFR calc non Af Amer: >90 mL/min (ref >90)
GLUCOSE: 101 mg/dL — AB (ref 70–99)
POTASSIUM: 3.8 meq/L (ref 3.7–5.3)
SODIUM: 145 meq/L (ref 137–147)

## 2014-03-15 LAB — PTH, INTACT AND CALCIUM
Calcium, Total (PTH): 8.8 mg/dL (ref 8.4–10.5)
PTH: 8 pg/mL — ABNORMAL LOW (ref 14–64)

## 2014-03-15 LAB — BLOOD GAS, ARTERIAL
Acid-Base Excess: 2.3 mmol/L — ABNORMAL HIGH (ref 0.0–2.0)
Bicarbonate: 25.7 mEq/L — ABNORMAL HIGH (ref 20.0–24.0)
DRAWN BY: 33099
O2 CONTENT: 4 L/min
O2 SAT: 94.7 %
PATIENT TEMPERATURE: 98.3
TCO2: 26.8 mmol/L (ref 0–100)
pCO2 arterial: 35.3 mmHg (ref 35.0–45.0)
pH, Arterial: 7.475 — ABNORMAL HIGH (ref 7.350–7.450)
pO2, Arterial: 75.5 mmHg — ABNORMAL LOW (ref 80.0–100.0)

## 2014-03-15 LAB — VITAMIN D 25 HYDROXY (VIT D DEFICIENCY, FRACTURES): Vit D, 25-Hydroxy: 64 ng/mL (ref 30–89)

## 2014-03-15 LAB — FERRITIN: Ferritin: 13426 ng/mL — ABNORMAL HIGH (ref 10–291)

## 2014-03-15 LAB — VITAMIN B12: Vitamin B-12: 540 pg/mL (ref 211–911)

## 2014-03-15 LAB — FOLATE: FOLATE: 14.9 ng/mL

## 2014-03-15 MED ORDER — LIDOCAINE HCL 2 % EX GEL
1.0000 "application " | Freq: Once | CUTANEOUS | Status: DC
  Filled 2014-03-15: qty 5

## 2014-03-15 MED ORDER — GADOBENATE DIMEGLUMINE 529 MG/ML IV SOLN
15.0000 mL | Freq: Once | INTRAVENOUS | Status: AC | PRN
  Administered 2014-03-15: 10 mL via INTRAVENOUS

## 2014-03-15 MED ORDER — PHENYLEPHRINE HCL 0.25 % NA SOLN
1.0000 | Freq: Four times a day (QID) | NASAL | Status: DC | PRN
  Filled 2014-03-15: qty 15

## 2014-03-15 MED ORDER — LORAZEPAM 2 MG/ML IJ SOLN
1.0000 mg | Freq: Once | INTRAMUSCULAR | Status: AC
Start: 2014-03-15 — End: 2014-03-15
  Administered 2014-03-15: 1 mg via INTRAVENOUS
  Filled 2014-03-15: qty 1

## 2014-03-15 MED ORDER — PREDNISONE 20 MG PO TABS
10.0000 mg | ORAL_TABLET | Freq: Every day | ORAL | Status: DC
  Administered 2014-03-16 – 2014-03-28 (×10): 10 mg via ORAL
  Filled 2014-03-15 (×11): qty 1

## 2014-03-15 MED ORDER — BUTAMBEN-TETRACAINE-BENZOCAINE 2-2-14 % EX AERO
1.0000 | INHALATION_SPRAY | Freq: Once | CUTANEOUS | Status: DC
  Filled 2014-03-15: qty 56

## 2014-03-15 NOTE — Progress Notes (Signed)
Pt seen and examined with Dr. Ethelene Hal. Please refer to resident note for details   Pt spoke with oncology yesterday and wished to f/u with oncology in [place]. She still complains of SOB and wheezing today and also still with persistent back pain  Exam:  Gen: AAO*3, NAD  CVS: tachycardic, normal heart sounds  Pulm: bilateral diffuse wheezes +  Abd: soft, non tender, BS +  Ext: no edema   Assessment and Plan:  68 y/o female p/w SOB, back pain found to have RUL lung mass with post obstructive pneumonitis   Lung mass/post obstructive PNA:  - Pt is now is s/p bronchoscopy. 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 Would d/c vanco and continue with zosyn only - monitor on tele  - f/u blood cx - NGTD   Hypercalcemia/back pain:  - Pt with extensive osseous mets - Calcium is stable. Will monitor  - Hypercalcemia likely secondary to bony mets - c/w Pain control for back pain  Thrombocytopenia/anemia:  - Iron studies c/w chronic disease likely secondary to underlying malignancy - Will monitor - Pt with mild thrombocytopenia likely secondary to underlying infection  Will consider transfer to [facility] if oncology wants to initiate treatment rather than wait for pt to f/u in [place]. Will d/w Onc

## 2014-03-15 NOTE — Care Management Note (Addendum)
Page 1 of 2   03/28/2014     1:37:01 PM CARE MANAGEMENT NOTE 03/28/2014  Patient:  Debbie Howe, Debbie Howe   Account Number:  [id number]  Date Initiated:  03/15/2014  Documentation initiated by:  Tomi Bamberger  Subjective/Objective Assessment:   dx pna  admit- from home.     Action/Plan:   [id number] now made dnr/palliative care to see/resp adjustment   Anticipated DC Date:  03/31/2014   Anticipated DC Plan:  [place]  CM consult      Choice offered to / List presented to:             Status of service:  In process, will continue to follow Medicare Important Message given?  YES (If response is "NO", the following Medicare IM given date fields will be blank) Date Medicare IM given:  03/15/2014 Medicare IM given by:  Tomi Bamberger Date Additional Medicare IM given:  03/18/2014 Additional Medicare IM given by:  [facility]  Discharge Disposition:    Per UR Regulation:  Reviewed for med. necessity/level of care/duration of stay  If discussed at [place] of Stay Meetings, dates discussed:   03/19/2014  03/21/2014    Comments:  03/28/2014/Yexalen Deike L. Rosana Hoes, RN, BSN, CCM: Chart review for medical necessity and patient discharge needs. Case Manager will follow for patient condition changes. chart note for progession: pccm-unfortunately, she has complete atelectasis on right. I do not share dr Antonieta Pert optimism here & have communicated with family on 11/11. Based on further discussion today, DNR issued. Mediastinal shift seems to suggest rt atelectasis - pleural effusion maybe reaccumulating but I doubt further thora of any benefit. I would suggest a palliaitve approach here - comfort seems to be competing for importance here. We will sign off  [id number]/Craig Wisnewski,RN.BSN,CCM: Chart and information reviewed for progression status and dc needs. Dg Chest Port 1 View 03/22/2014: No change. Complete opacification of the right hemi  thorax without mediastinal shift. Mild patchy density at the left lung base.   Dg Chest Port 1 View 03/23/2014: No change from the earlier study. Dg Chest Port 1 View 03/23/2014: No significant interval change in the appearance of the chest. Persistent whiteout of the right hemi thorax with probable combination of pleural effusion and postobstructive collapse.   Thoracentesis 03/24/14:  750 mL bloody fluid removed. Dg Chest Port 1 View 03/24/2014: 1. No pneumothorax following right thoracentesis. 2. Significantly decreased right pleural fluid. 3. Large right upper lobe mass. 4. Changes of COPD and chronic bronchitis with pulmonary vascular congestion and mild interstitial pulmonary edema.   Electronically Signed   By: Enrique Sack M.D.   On: 03/24/2014 12:03 Thrombocytopenia-WBC0.3/temp 98.9/  [id number]/Chart update and progress: Damonie Furney,RN,BSn,CCM: A/P: 1. Respiratory depression likely secondary to pain meds, but she is arousable and needs the pain meds for cancer. She has been lethargic on and off since admission. R/p CXR to evaluate PNA/right lobe opacification. will follow for needs patient in state of transition remains a full code and on 40% o2  [id number]:chart review and note update: Suanne Marker Lettie Czarnecki,RN,BN,CCM Bronchoscopy 03/04/2014  - Cytology brushings from RML and RUL; EBBx from RUL bronchus and a nodule from the R mainstem; TBNA targeting pretracheal and subtracheal lymph nodes; All specimens sent for cytology and surg path. Ct Abdomen Pelvis W Contrast 03/17/2014 1. Progressive enlargement of right pleural effusion with visible new atelectasis and collapse of the right lower lung. Correlation with chest x-ray a may be  helpful as this may now represent a largely drowned right lung. 2. New small left pleural effusion. 3. 3 right hepatic lesions are identified, likely representing metastatic disease to the liver. The largest measures 1.5 cm. No other evidence of metastatic disease in the abdomen or  pelvis.   Dg Chest Port 1 View 03/17/2014 Complete opacification of the right hemithorax due to a combination of right effusion and right upper lobe consolidation with right upper lobe mass as noted on recent CT. Suspect a combination of interstitial edema and redistribution of blood flow the left lung. No airspace consolidation on the left. Ir Thoracentesis Asp Pleural Space W/img Guide 03/18/2014: Successful ultrasound guided right thoracentesis yielding 600 cc of pleural fluid.   Electronically Signed   By: Maryclare Bean M.D.   On: 03/18/2014 16:21    [id number]/DIYMEB Rosana Hoes RN, BSN, CCM Chart reviewed. Discharge needs and patient's stay to be reviewed and followed by case manager. had 600 mL of fluid removed from right lung. has progression of her malignancy and right lung obstruction. poss.small cell lung cancer. hemoglobin  7.2 today plan for 2 units of blood/o2 at 40% via venturi mask.  Iv pain meds and steroids.  03/18/14 KATHY MAHABIR RN,BSN NCM [place]/P PICC, & THORACENTESIS.FOR CHEMO IN AM.NEW RA:XENMMHWKG NSCL LUNG CA.FULL CODE.PALLIATIVE FOLLOWING.   03/15/14 [place] [phone] patient is from home, NCM will contiunue to follow for dc needs.

## 2014-03-15 NOTE — Progress Notes (Signed)
Called on-call provider regarding patient's orders to transfer to step-down unit. MD confirmed that patient is to be transferred to SDU. Bed request placed. Will continue to monitor.

## 2014-03-15 NOTE — Progress Notes (Signed)
ANTIBIOTIC CONSULT NOTE  Pharmacy Consult for Vancomycin, Zosyn Indication: pneumonia  No Known Allergies  Patient Measurements: Height: 5\' 2"  (157.5 cm) Weight: 133 lb (60.328 kg) IBW/kg (Calculated) : 50.1  Labs:  Recent Labs  03/13/14 0714 03/05/2014 0545 03/15/14 0401  WBC 10.1 11.0* 10.4  HGB 9.3* 8.9* 9.0*  PLT 121* 109* 111*  CREATININE 0.58 0.52 0.57   Estimated Creatinine Clearance: 56 ml/min (by C-G formula based on Cr of 0.57). No results found for this basename: VANCOTROUGH, Corlis Leak, VANCORANDOM, [place], GENTPEAK, [place], TOBRATROUGH, TOBRAPEAK, TOBRARND, AMIKACINPEAK, AMIKACINTROU, AMIKACIN,  in the last 72 hours   Microbiology: Recent Results (from the past 720 hour(s))  URINE CULTURE     Status: None   Collection Time    02/16/2014  2:01 PM      Result Value Ref Range Status   Specimen Description URINE, CLEAN CATCH   Final   Special Requests NONE   Final   Culture  Setup Time     Final   Value: 03/10/2014 21:10     Performed at [facility]     Final   Value: >=100,000 COLONIES/ML     Performed at [facility]   Culture     Final   Value: Multiple bacterial morphotypes present, none predominant. Suggest appropriate recollection if clinically indicated.     Performed at [facility]   Report Status 03/13/2014 FINAL   Final  CULTURE, BLOOD (ROUTINE X 2)     Status: None   Collection Time    03/01/2014  2:20 PM      Result Value Ref Range Status   Specimen Description BLOOD RIGHT ANTECUBITAL   Final   Special Requests BOTTLES DRAWN AEROBIC AND ANAEROBIC 5MLS   Final   Culture  Setup Time     Final   Value: 03/06/2014 20:52     Performed at [facility]   Culture     Final   Value:        BLOOD CULTURE RECEIVED NO GROWTH TO DATE CULTURE WILL BE HELD FOR 5 DAYS BEFORE ISSUING A FINAL NEGATIVE REPORT     Performed at [facility]   Report Status PENDING   Incomplete  CULTURE, BLOOD (ROUTINE X  2)     Status: None   Collection Time    03/13/2014  3:15 PM      Result Value Ref Range Status   Specimen Description BLOOD RIGHT ANTECUBITAL   Final   Special Requests BOTTLES DRAWN AEROBIC AND ANAEROBIC 10MLS   Final   Culture  Setup Time     Final   Value: 02/25/2014 20:53     Performed at [facility]   Culture     Final   Value:        BLOOD CULTURE RECEIVED NO GROWTH TO DATE CULTURE WILL BE HELD FOR 5 DAYS BEFORE ISSUING A FINAL NEGATIVE REPORT     Performed at [facility]   Report Status PENDING   Incomplete    Medical History: Past Medical History  Diagnosis Date  . Rheumatoid arthritis     Assessment: 68 year old female on Vancomycin and Zosyn for pneumonia. Her renal function is stable. Afeb.  Cultures neg to date.  Workup in process for lung cancer   Goal of Therapy:  Vancomycin trough level 15-20 mcg/ml Appropriate Zosyn dosing  Plan:  Zosyn 3.375 grams iv Q 8 hour - 4 hr infusion Vancomycin  500 mg iv Q 12 hours Follow Scr, progress, fever trend, cultures  Thanks for allowing pharmacy to be a part of this patient's care.  Excell Seltzer, PharmD Clinical Pharmacist, [phone] 03/15/2014,10:20 AM

## 2014-03-15 NOTE — Progress Notes (Signed)
Subjective: Ms Beets tolerated her bronchoscopy well yesterday. 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 We then consulted oncology in the afternoon and she was seen by Dr Jana Hakim who recommends brain MRI in addition to previously ordered L spine MRI. This morning we rounded on Ms Liew right before she was to go to MRI. She was very uncomfortable and said that she still has SOB and back pain. She also has abdominal pain and says she has not had a bowel movement in three days.  Objective: Vital signs in last 24 hours: Filed Vitals:   03/01/2014 1454 03/03/2014 2107 03/05/2014 2230 03/15/14 0506  BP: 132/67  136/68 149/76  Pulse: 114 119 119 116  Temp: 98.3 F (36.8 C)  98.3 F (36.8 C) 97.4 F (36.3 C)  TempSrc: Oral  Oral Oral  Resp: 16 20 18 18   Height:      Weight:      SpO2: 95% 94% 92% 95%   Weight change:   Intake/Output Summary (Last 24 hours) at 03/15/14 1136 Last data filed at 02/28/2014 2232  Gross per 24 hour  Intake    120 ml  Output    400 ml  Net   -280 ml   Gen: A&O x 4,very uncomfortable, thin  HEENT: Atraumatic, PERRL, EOMI, sclerae anicteric, dry mucous membranes  Heart: tachycardic but regular, normal S1 S2, no murmurs, rubs, or gallops  Lungs: bilateral inspiratory and expiratory wheezes, respirations unlabored  Abd: Soft, diffuse tenderness, minimally distended, + bowel sounds, no hepatosplenomegaly  Ext: No edema or cyanosis  Lab Results: Basic Metabolic Panel:  Recent Labs Lab 03/02/2014 0545 03/15/14 0401  NA 144 145  K 3.3* 3.8  CL 103 106  CO2 27 26  GLUCOSE 85 101*  BUN 11 12  CREATININE 0.52 0.57  CALCIUM 9.5 9.6   Liver Function Tests:  Recent Labs Lab 03/06/2014 1227 03/13/14 0714  AST 76* 83*  ALT 18 16  ALKPHOS 140* 142*  BILITOT 0.4 0.4  PROT 7.2 6.3  ALBUMIN 2.2* 1.7*   CBC:  Recent Labs Lab 03/11/2014 1227  02/19/2014 0545 03/15/14 0401  WBC 13.9*  < > 11.0* 10.4    NEUTROABS 11.2*  --   --   --   HGB 11.3*  < > 8.9* 9.0*  HCT 35.1*  < > 28.4* 28.2*  MCV 94.9  < > 93.1 93.4  PLT 164  < > 109* 111*  < > = values in this interval not displayed. BNP:  Recent Labs Lab 03/05/2014 1515  PROBNP 769.4*   Urinalysis:  Recent Labs Lab 02/17/2014 1401  COLORURINE AMBER*  LABSPEC 1.017  PHURINE 6.0  GLUCOSEU NEGATIVE  HGBUR NEGATIVE  BILIRUBINUR MODERATE*  KETONESUR 40*  PROTEINUR NEGATIVE  UROBILINOGEN 1.0  NITRITE NEGATIVE  LEUKOCYTESUR SMALL*  many bacteria, many squamous epithelial cells, hyaline casts GGT: 32 Vit d, 25-hydroxy 64 Vitamin b12 540 TIBC 162 Iron 67 Reticulocyte count 1%  Micro Results: Recent Results (from the past 240 hour(s))  URINE CULTURE     Status: None   Collection Time    03/03/2014  2:01 PM      Result Value Ref Range Status   Specimen Description URINE, CLEAN CATCH   Final   Special Requests NONE   Final   Culture  Setup Time     Final   Value: 02/18/2014 21:10     Performed at [facility]  Final   Value: >=100,000 COLONIES/ML     Performed at [facility]   Culture     Final   Value: Multiple bacterial morphotypes present, none predominant. Suggest appropriate recollection if clinically indicated.     Performed at [facility]   Report Status 03/13/2014 FINAL   Final  CULTURE, BLOOD (ROUTINE X 2)     Status: None   Collection Time    03/08/2014  2:20 PM      Result Value Ref Range Status   Specimen Description BLOOD RIGHT ANTECUBITAL   Final   Special Requests BOTTLES DRAWN AEROBIC AND ANAEROBIC 5MLS   Final   Culture  Setup Time     Final   Value: 03/15/2014 20:52     Performed at [facility]   Culture     Final   Value:        BLOOD CULTURE RECEIVED NO GROWTH TO DATE CULTURE WILL BE HELD FOR 5 DAYS BEFORE ISSUING A FINAL NEGATIVE REPORT     Performed at [facility]   Report Status PENDING   Incomplete  CULTURE, BLOOD (ROUTINE X 2)      Status: None   Collection Time    03/08/2014  3:15 PM      Result Value Ref Range Status   Specimen Description BLOOD RIGHT ANTECUBITAL   Final   Special Requests BOTTLES DRAWN AEROBIC AND ANAEROBIC 10MLS   Final   Culture  Setup Time     Final   Value: 03/09/2014 20:53     Performed at [facility]   Culture     Final   Value:        BLOOD CULTURE RECEIVED NO GROWTH TO DATE CULTURE WILL BE HELD FOR 5 DAYS BEFORE ISSUING A FINAL NEGATIVE REPORT     Performed at [facility]   Report Status PENDING   Incomplete   Studies/Results: No results found. Medications: I have reviewed the patient's current medications. Scheduled Meds: . albuterol  2.5 mg Nebulization Q6H  . amitriptyline  100 mg Oral QHS  . feeding supplement (ENSURE COMPLETE)  237 mL Oral BID BM  . folic acid  1 mg Oral Daily  . gabapentin  400 mg Oral Daily  . heparin  5,000 Units Subcutaneous 3 times per day  . methotrexate  15 mg Oral Q Thu  . piperacillin-tazobactam (ZOSYN)  IV  3.375 g Intravenous Q8H  . polyethylene glycol  17 g Oral Daily  . predniSONE  30 mg Oral Q breakfast  . sodium chloride  3 mL Intravenous Q12H  . vancomycin  500 mg Intravenous Q12H   Continuous Infusions: . sodium chloride 75 mL/hr at 02/23/2014 1927   PRN Meds:.bisacodyl, fentaNYL, HYDROcodone-acetaminophen, lidocaine (PF), lidocaine, midazolam, phenylephrine Assessment/Plan: Active Problems:   Pneumonia  #Thoracic Mass likely Lung Cancer: Large R hilar mass is likely responsible for SOB.  Her SOB is unchanged. She has scheduled albuterol which she says helps but only temporarily. She is sating in the mid 90s on 2L nasal cannula. 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 She was then seen by oncology who discussed her likely diagnosis. The patient says she will see Dr Dennison Nancy in [place] for treatment. She also has hypercalcemia with adjusted Ca 11.4 (taking into account  hypoalbuminemia). [facility] labs are mostly pending with only normal vit d 25 hydroxy returned. There is concern back pain may be mets. Oncology recommends brain MRI  as well to see if radiation oncology needs to be involved. -appreciate pulm, onc -f/u bronch pathology/cytology -f/u MR L spine w wo contrast, brain w wo contrast -f/u vit d 1,25 dihydroxy, PTH related peptide, PTH intact and calcium -albuterol 2.5 mg neb q6h for symptom relief as she is not requesting PRN -NS @ 75 cc/hr  -f/u BCx x 2  -cardiac monitoring   #Back Pain: We are concerned that this may be metastases rather than related to her RA. Her alkaline phosphatase was elevated at 142 and her GGT is normal at 32. She says she got a recent back X-ray from her PCP which only showed degenerative changes. She also has hypercalcemia with adjusted Ca 11.4 (taking into account hypoalbuminemia) with calcium workup described above. -f/u MR lumbar spine w wo contrast -check vit d 1,25 dihydroxy, PTH related peptide, PTH intact and calcium -vicodin 5-325 po q4hprn   #Postobstructive Pneumonia: Ms Mayden may have superimposed pneumonia secondary to obstructive effect or the mass is responsible for leukocytosis, fever, tachycardia. She has been afebrile overnight with WBC trending down from 13.9 on presentation to 10.4 since beginning vanc/zosyn. She is ask risk as she is immunosuppressed from methotrexate and prednisone use 2/2 RA. -vanc/zosyn per pharmacy   #Rheumatoid Arthritis: Ms Breidenbach is on methotrexate 15 mg weekly on Thursdays and prednisone 10 mg daily. This may explain her back pain versus metastasis. We are getting spine MR given elevated alk phos and normal GGT in setting of progressive back pain. -cont methotrexate 15 mg po weekly on Thursdays  -prednisone 30 mg daily x 3 days as stress dose  -vicodin 5-325 po q4hprn  -f/u MR lumbar spine w wo contrast  #Anemia: Hemoglobin on presentation 11.3 now 9.0. Patient hemodynamically  stable w no obvious bleeding. Anemia work-up still pending but so far notable for low TIBC (162) suggesting anemia of chronic disease. -f/u final anemia panel -FOBC -cont to monitor  #Abdominal Pain: May be 2/2 constipation as patient has not had bowel movement in 3 days. We have tried miralax and dulcolax so will give enema. She had elevated alk phos with normal GGT as described above and mildly elevated AST. Will reassess after bowel movement. -tap water enema -cont to monitor  #Hypokalemia: Resolved as K is now 3.8 following KDur 40 mEq (was 3.3 on 10/29) -KDur 40 mEq in afternoon after bronch -cont to monitor  #s/p orthopedic neck surgery: Patient and significant other report she had neck surgery several years ago but could not describe further with no known complications  -cont gabapentin 400 mg daily presumed for this diagnsois   #Insomnia: Patient reports that she takes amitriptyline 100 mg daily for insomnia but question whether she is not forthcoming with any past psych history  -continue amitripyline 100 mg daily   #Asymptomatic bacteruria: Patient has many bacterial morphotypes with non predominant on UA but denies dysuria. She is immunosuppressed and we have begun abx for possible postobstructive PNA. This is likely contaminant. -cont abx per above   #Diet: regular  #DVT ppx: heparin 5000 u tid, SCD  Dispo: Disposition is deferred at this time, awaiting improvement of current medical problems.  Anticipated discharge in approximately 2 day(s).   The patient does have a current PCP Charletta Cousin, MD) and does need an [facility] hospital follow-up appointment after discharge.  The patient does not know have transportation limitations that hinder transportation to clinic appointments.  .Services Needed at time of discharge: Y = Yes, Blank = No PT:  OT:   RN:   Equipment:   Other:     LOS: 3 days   Kelby Aline, MD 03/15/2014, 11:36 AM

## 2014-03-15 NOTE — Progress Notes (Signed)
Path results discussed with pt and family. I have spoken with Dr Osker Mason of Oncology who will see later today. It seems appropriate to treat her very aggressively and also begin EOL/advanced directive discussions as this is very advanced and seemingly very aggressive cancer  PCCM will sign off. Please call if we can be of further assistance  Merton Border, MD ; [facility] [phone].  After 5:30 PM or weekends, call [phone]

## 2014-03-15 NOTE — Discharge Summary (Signed)
Name: Debbie Howe MRN: [id number] DOB: 1945/08/04 68 y.o. PCP: Charletta Cousin, MD  Date of Admission: 02/26/2014 12:11 PM Date of Transfer: 03/16/2014 Attending Physician: Aldine Contes, MD  Transfer Diagnosis:  Active Problems:   Pneumonia Metastatic small cell lung cancer Discharge Medications:   Medication List    ASK your doctor about these medications       amitriptyline 25 MG tablet  Commonly known as:  ELAVIL  Take 100 mg by mouth daily.     CALCIUM 600 + D PO  Take 1 tablet by mouth daily.     folic acid 1 MG tablet  Commonly known as:  FOLVITE  Take 1 mg by mouth daily.     gabapentin 400 MG capsule  Commonly known as:  NEURONTIN  Take 400 mg by mouth daily.     methotrexate 2.5 MG tablet  Commonly known as:  RHEUMATREX  Take 15 mg by mouth once a week. On Thursday. Caution:Chemotherapy. Protect from light.     predniSONE 5 MG tablet  Commonly known as:  DELTASONE  Take 10 mg by mouth daily with breakfast.     vitamin E 400 UNIT capsule  Take 400 Units by mouth 2 (two) times daily.        Disposition and follow-up:   Ms.[name] was transferred from [facility] in Stable condition.  At the hospital follow up visit please address:  1.  Treatment plan, symptom management, goals of care, code status  2.  Labs / imaging needed at time of follow-up: per onc  3.  Pending labs/ test needing follow-up: Final tissue pathology and cytometry, [facility] Follow-up Appointments:    Consultations: Treatment Team:  [name], MD  Procedures Performed:  Ct Chest W Contrast  02/21/2014   CLINICAL DATA:  Shortness of breath. Right upper lobe consolidation.  EXAM: CT CHEST WITH CONTRAST  TECHNIQUE: Multidetector CT imaging of the chest was performed during intravenous contrast administration.  CONTRAST:  56mL OMNIPAQUE IOHEXOL 300 MG/ML  SOLN  COMPARISON:  02/21/2014; 06/18/2010  FINDINGS: Unfortunately there is extensive mediastinal  and right hilar adenopathy and right hilar mass causing obstruction of the right upper lobe bronchus and postobstructive pneumonitis/ drowned lung appearance. The super hilar component of the mass is difficult to measure given the similar density of surrounding atelectatic lung but based on the surrounding vessels is estimated to be about 5.4 by 4.1 cm. An upper right paratracheal lymph node measures 1.8 cm in short axis, image 13 series 2. A precarinal lymph node has indistinct margins but measures approximately 3 cm in short axis, image 23 series 2. Subcarinal node measures 3 cm in short axis, image 27 series 2. Infrahilar node on the right 2 cm in short axis, image 32 series 2.  There is some extrinsic mass effect on the right pulmonary artery and its branches. I cannot exclude invasion of the right pulmonary artery with bland or tumor thrombus in the right pulmonary artery on images 29 through 34 of series 2. Similar appearance on images 61 through 47 of series 6. Tumor and/or confluent nodal disease surrounds the bronchus intermedius and carina. 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 Paratracheal and right upper internal mammary adenopathy. A pathologic right axillary lymph node measures 1.7 cm in short axis, image 23 series 2.  Small right pleural effusion associated with a 0.9 by 0.7 cm pleural nodule medially along the right posterior pleura, image 45  series 2. There is also pleural nodularity further laterally at the same level measuring approximately 1.6 by 0.6 cm. Mild pleural nodularity along the right middle lobe. There is pleural nodularity along the right hemidiaphragm. Appearance compatible with malignant effusion.  Multiple abnormal hypodense lesions are present in the visualized right hepatic lobe. An index lesion in segment 8 measures 1.3 by 0.9 cm on image 49 of series 2. At least 2 other hypodense lesions are present in the liver. There is scarring in  the right mid kidney anteriorly. No adrenal mass.  A superior segment right lower lobe nodule measures 0.5 by 0.4 cm on image 16 of series 3.  Bilateral C7 cervical ribs. Right supraclavicular lymph node short axis diameter 1.6 cm, image 9 series 2. No definite osseous metastatic disease. Mild coronary artery atherosclerosis in the left main coronary.  IMPRESSION: 1. Unfortunately the patient has a large right hilar and suprahilar mass with extensive adenopathy in the mediastinum, right hilum, right infrahilar region, right axilla, and right supraclavicular region in addition to a small but malignant right pleural effusion. Appearance compatible with lung cancer, tissue diagnosis recommended. Hypodense liver lesions are not entirely specific but concerning for metastatic disease -nuclear medicine PET-CT may be helpful in determining the full extent of metastatic burden. Extensive postobstructive pneumonitis in the right upper lobe. 2. Right hilar mass is causing extrinsic compression on the right pulmonary artery to such a degree that I cannot completely exclude pulmonary arterial invasion with bland or tumor thrombus in the right pulmonary artery and its branches. Today's exam was not performed with the pulmonary embolus protocol. 3. Superior segment right lower lobe nodule, 0.5 by 0.4 cm, nonspecific but possibly metastatic. 4. 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   Electronically Signed   By: Sherryl Barters M.D.   On: 03/11/2014 16:39   Mr [name] NW Contrast  03/15/2014   CLINICAL DATA:  Newly diagnosed lung mass.  Altered mental status.  EXAM: MRI HEAD WITHOUT AND WITH CONTRAST  TECHNIQUE: Multiplanar, multiecho pulse sequences of the brain and surrounding structures were obtained without and with intravenous contrast.  CONTRAST:  54mL MULTIHANCE GADOBENATE  DIMEGLUMINE 529 MG/ML IV SOLN  COMPARISON:  None available.  FINDINGS: The diffusion-weighted images demonstrate no acute infarct of the brain. There is diffuse signal abnormality within the calvarium compatible with widespread metastatic disease. This is confirmed on the postcontrast images. Metastatic changes are noted within the upper cervical spine is well. The patient is status post fusion at C3-4 and C4-5.  No acute hemorrhage or mass lesion is present. The postcontrast images demonstrate no parenchymal or leptomeningeal enhancement. Mild atrophy and white matter disease is minimally advanced for age.  Flow is present in the major intracranial arteries. The globes and orbits are intact. The paranasal sinuses and mastoid air cells are clear.  IMPRESSION: 1. Mild age advanced white matter disease. This likely reflects the sequela of chronic microvascular ischemia. 2. No evidence for metastatic disease to the brain or meninges. 3. Widespread osseous metastatic disease including the upper cervical spine and calvarium.   Electronically Signed   By: Lawrence Santiago M.D.   On: 03/15/2014 13:14   Mr Lumbar Spine W Wo Contrast  03/15/2014   CLINICAL DATA:  Severe back pain with arthritis. Recently diagnosed with large right hilar mass, presumably bronchogenic carcinoma. Evaluate for arthritis versus metastatic disease. Initial encounter.  EXAM: MRI LUMBAR SPINE WITHOUT AND WITH CONTRAST  TECHNIQUE: Multiplanar and multiecho pulse sequences of the lumbar spine were obtained without and with intravenous contrast.  CONTRAST:  46mL MULTIHANCE GADOBENATE DIMEGLUMINE 529 MG/ML IV SOLN  COMPARISON:  Chest CT 03/07/2014.  Lumbar radiographs 02/26/2014.  FINDINGS: Five lumbar type vertebral bodies are assumed. The alignment is normal. There is no evidence of fracture or pars defect. There is widespread signal abnormality with heterogeneous enhancement throughout the lumbar spine and visualized pelvis consistent with  extensive metastatic disease.  The conus medullaris extends to the L1 level and appears normal. There is no abnormal intradural enhancement or evidence of epidural tumor.  There are no significant disc space findings from T11-12 through L1-2.  L2-3: Mild loss of disc height with annular bulging and facet hypertrophy. No significant spinal stenosis or nerve root encroachment.  L3-4: Annular disc bulging, facet and ligamentous hypertrophy contribute to mild spinal and right greater than left lateral recess stenosis. The foramina are sufficiently patent.  L4-5: Annular disc bulging, facet and ligamentous hypertrophy contribute to mild spinal and lateral recess stenosis bilaterally. The foramina are sufficiently patent.  L5-S1: Disc height and hydration are maintained. There is mild bilateral facet hypertrophy. No spinal stenosis or nerve root encroachment.  IMPRESSION: 1. Widespread metastatic disease to the lumbar spine and visualized pelvis. No pathologic fracture or epidural tumor demonstrated. 2. Relatively mild spondylosis with disc bulging, facet and ligamentous hypertrophy as described. At L3-4 and L4-5, there is mild spinal and lateral recess stenosis bilaterally.   Electronically Signed   By: Camie Patience M.D.   On: 03/15/2014 13:03   Dg Chest Port 1 View  02/18/2014   CLINICAL DATA:  Shortness of breath and nausea today.  EXAM: PORTABLE CHEST - 1 VIEW  COMPARISON:  Chest CT 06/18/2010  FINDINGS: Dense right upper lobe airspace consolidation could reflect lobar pneumonia or an obstructing mass (favored). Recommend chest CT with contrast for further evaluation. The left lung is clear. No pleural effusion. The bony thorax is intact.  IMPRESSION: Dense right upper lobe airspace consolidation could reflect lobar pneumonia or obstructing mass (favored). Recommend chest CT with contrast for further evaluation.   Electronically Signed   By: Kalman Jewels M.D.   On: 02/18/2014 13:13   Bronchoscopy Findings:    Main carina slightly splayed and edematous  Normal airway exam on L except mild bronchitic changes  Entire R bronchial tree mucosa extensively studded with tumor infiltration/cobblestoning (see pic)  RUL and RML bronchi are nearly totally obstructed with endobronchial tumor and extrinsic compression   Admission HPI: Ms Tindall is 68 year old woman with RA who presented with SOB. Per the patient and her significant other, she was in her usual state of health until ~1-1.5 months ago. She then developed a sore midline lower back pain that radiates down her legs. She says this back pain is different than any pain she has had with her RA in the past. She also developed concurrent shortness of breath. She says the SOB is worse when she is lying flat or on exertion. She also reports some unknown wait loss over this time frame as well as night sweats and fatigue. Of note, she has a 50 pack year history of smoking <1 ppd but has been just using vapes for the past year. She denies chest pain, abdominal pain, dysuria, cough.   In the ED, she had WBC 13.9, tachycardia in the 120s, rectal temperature 101, so was started on azithromycin  500 mg IV once,ceftriaxone 1 g once, 2L NS bolus and tylenol 650 mg po once. She had CXR with dense R upper airspace consolidation and then had CT with a large R hilar and suprahilar mass and extensive lymphadenopathy.   Hospital Course by problem list: Active Problems:   Pneumonia   #Small Cell Lung Cancer with Osseous Metastases: Ms Llewellyn's R hilar mass is likely responsible for her SOB. 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 She was then seen by oncology who discussed her likely diagnosis. Her final pathology and cytology are pending but the preliminary read is that she had small cell lung cancer. Her alkaline phosphatase was elevated at 142 and her GGT is normal at 32, which given complaints of intense back pain and hypercalcemia  (11.4 after correcting for hypoalbuminemia), warranted MRI. She says she got a recent back X-ray from her PCP which only showed degenerative changes. Her MR lumbar spine demonstrated widespread metastatic disease to the lumbar spine and visualized pelvis. She also had a brain MRI which showed no evidence of metastatic disease to the brain or meninges but widespread osseous metastatic disease including the upper cervical spine and calvarium. She is sating in the mid 90s on 2L nasal cannula. Her pain is being treated with vicodin and she has scheduled albuterol which she says helps but only temporarily. She was seen by oncology who recommended transfer to [facility] for palliative chemotherapy. A palliative care consult was also placed.  #Postobstructive Pneumonia: Ms Roes may have superimposed pneumonia secondary to obstructive effect or the mass is responsible for leukocytosis, fever, tachycardia. She has since been afebrile with WBC trending down from 13.9 on presentation to 8 on transfer since beginning vanc/zosyn. She is at risk for infection as she is immunosuppressed from methotrexate and prednisone use secondary to RA.   #Abdominal Pain: This is concerning for abdominal involvement of her malignancy. Her alkaline phosphatase and AST have been increasing since her presentation. Her ferritin is >13000. This may also be secondary to constipation. We have tried miralax and dulcolax so will give tap water enema.   #Anemia/Thrombocytopenia: Hemoglobin on presentation 11.3 now 9.0. Patient hemodynamically stable w no obvious bleeding. Anemia work-up notable for extremely elevated ferritin >13000 and low TIBC (162). This may represent liver and/or bone marrow involvement of metastases which would also explain AST elevation. As her platelet count has decreased by >50% over several days since initiating heparin for DVT ppx, we have d/ced this and are using SCDs  #Altered Mental Status-Resolved: On 10/30, the  treating team was called that Ms. Macho returned from MRI tachycardic in the 140s, RR high 20s, and sating 89-90% on 3 L and altered. She had received 1 mg ativan iv once prior to MRI. She had several family members in the room who said she seemed tired and confused but got up to go to the bathroom twice. We increased her O2 to 4L which achieved oxygen sat of high 90s w HR in the 120s. She was arousable with loud voice or touching and oriented to person and place. Pupils were equally round and reactive to light and she spontaneously moved all four extremities. Her heart and lung exams were unchanged. She just had a brain MRI done that showed no mets to the brain or meninges (only osseous involvement). We obtained ABG which was mildly alkalotic with hypoxemia. This was likely secondary to ativan use as she is only mildly hypoxemic on ABG. She was  transferred to step-down with q2h neuro checks. Her mental status was back at baseline the morning of transfer.  #Rheumatoid Arthritis: Ms Carriveau is on methotrexate 15 mg weekly on Thursdays and prednisone 10 mg daily. We initially thought this may be contributing to back pain but MR lumbar spine revealed widespread metastases. We continued home methotrexate and gave stress dose of prednisone 30 mg x 3 days then went back to home dose of 10 mg daily.  #s/p orthopedic neck surgery: Patient and significant other report she had neck surgery several years ago but could not describe further with no known complications. Brain MR demonstrated cervical spine metastases. We continued home gabapentin 400 mg daily presumed for this diagnsois.  #Insomnia: Patient reports that she takes amitriptyline 100 mg daily for insomnia but question whether she is not forthcoming with any past psych history. We continued home amitripyline 100 mg daily.  #Asymptomatic bacteruria: Patient has many bacterial morphotypes with non predominant on UA but denies dysuria. She is immunosuppressed and  received vanc/zosyn then zosyn to treat postobstructive PNA which would cover possible UTI.   Transfer Vitals:   BP 122/77  Pulse 116  Temp(Src) 97.3 F (36.3 C) (Oral)  Resp 23  Ht 5\' 2"  (1.575 m)  Wt 134 lb 4.2 oz (60.9 kg)  BMI 24.55 kg/m2  SpO2 94%  Transfer Physical Exam: Gen: A&O x 4,tearful and uncomfortable, thin  HEENT: Atraumatic, PERRL, EOMI, sclerae anicteric, dry mucous membranes  Heart: tachycardic but regular, normal S1 S2, no murmurs, rubs, or gallops  Lungs: poor effort, diminished breath sounds on the R but sounds clear, respirations unlabored  Abd: Soft, non-tender, non-distended, + bowel sounds, no hepatosplenomegaly  Ext: No edema or cyanosis   Transfer Labs:  Basic Metabolic Panel:  Recent Labs Lab 03/15/14 0401 03/16/14 0710  NA 145 147  K 3.8 3.5*  CL 106 107  CO2 26 26  GLUCOSE 101* 109*  BUN 12 14  CREATININE 0.57 0.47*  CALCIUM 9.6 9.7   Liver Function Tests:  Recent Labs Lab 03/13/14 0714 03/16/14 0710  AST 83* 266*  ALT 16 56*  ALKPHOS 142* 249*  BILITOT 0.4 0.6  PROT 6.3 5.9*  ALBUMIN 1.7* 1.7*  CBC:  Recent Labs Lab 03/09/2014 1227  03/15/14 0401 03/16/14 0710  WBC 13.9*  < > 10.4 8.0  NEUTROABS 11.2*  --   --   --   HGB 11.3*  < > 9.0* 8.9*  HCT 35.1*  < > 28.2* 27.8*  MCV 94.9  < > 93.4 93.0  PLT 164  < > 111* 66*  < > = values in this interval not displayed. BNP:  Recent Labs Lab 03/15/2014 1515  PROBNP 769.4*   Anemia Panel:  Recent Labs Lab 02/20/2014 1032  VITAMINB12 540  FOLATE 14.9  FERRITIN 43154*  TIBC 162*  IRON 67  RETICCTPCT 1.0   Urinalysis:  Recent Labs Lab 03/11/2014 1401  COLORURINE AMBER*  LABSPEC 1.017  PHURINE 6.0  GLUCOSEU NEGATIVE  HGBUR NEGATIVE  BILIRUBINUR MODERATE*  KETONESUR 40*  PROTEINUR NEGATIVE  UROBILINOGEN 1.0  NITRITE NEGATIVE  LEUKOCYTESUR SMALL*  many bacteria, many squamous epithelial cells, hyaline casts  GGT: 32  Vit d, 25-hydroxy 64  PTH intact 8    Vitamin b12 540  TIBC 162  Iron 67  Folate 15  Ferritin 13426  Reticulocyte count 1%   Signed: Kelby Aline, MD 03/16/2014, 2:56 PM    Services Ordered on Transfer: none Equipment Ordered on Transfer: none

## 2014-03-15 NOTE — Progress Notes (Signed)
S: We were called to the patient's bedside by nursing staff as she was tachy in the 140s, RR high 20s, and sating 89-90% on 3 L and altered returning from MRI. She had received 1 mg ativan iv once prior to MRI. She had several family members in the room who said she seemed tired and confused but got up to go to the bathroom twice. We increased her O2 to 4L which achieved oxygen sat of high 90s w HR in the 120s.   O: HR 120s, O2 sat 98% on 4L  Gen: arousable with loud voice or touching, oriented to person and place ("hospital"), annoyed when shaken awake Neuro: PERRL, sticks out tongue midline, moves all four extremities spontaneously Cardiac: sinus tachy, nl S1, S2, no MRG Lungs: unchanged wheezes, tachypnic but no work of breathing    pH 7.47, pCO2 35, pO2 75, bicarb 26 on 4 L O2  A/P: Debbie Howe is a 68 y/o woman with recently diagnosed small cell lung cancer and extensive metastases throughout the spine. She just had a brain MRI done that showed no mets to the brain or meninges (only osseous involvement). We obtained ABG which was mildly alkalotic with hypoxemia. This is likely secondary to ativan use as she is only mildly hypoxemic on ABG. -transfer to step-down -neuro checks q2h -non rebreather available at bedside -night team to check patient  Debbie Mussel, MD 03/15/14 4:26 PM

## 2014-03-16 ENCOUNTER — Inpatient Hospital Stay (HOSPITAL_COMMUNITY): Payer: Medicare Other

## 2014-03-16 DIAGNOSIS — C787 Secondary malignant neoplasm of liver and intrahepatic bile duct: Secondary | ICD-10-CM

## 2014-03-16 DIAGNOSIS — R748 Abnormal levels of other serum enzymes: Secondary | ICD-10-CM

## 2014-03-16 DIAGNOSIS — C3490 Malignant neoplasm of unspecified part of unspecified bronchus or lung: Secondary | ICD-10-CM

## 2014-03-16 DIAGNOSIS — C3491 Malignant neoplasm of unspecified part of right bronchus or lung: Secondary | ICD-10-CM

## 2014-03-16 DIAGNOSIS — R000 Tachycardia, unspecified: Secondary | ICD-10-CM

## 2014-03-16 DIAGNOSIS — R062 Wheezing: Secondary | ICD-10-CM

## 2014-03-16 DIAGNOSIS — R4182 Altered mental status, unspecified: Secondary | ICD-10-CM

## 2014-03-16 LAB — CBC
HEMATOCRIT: 27.8 % — AB (ref 36.0–46.0)
HEMOGLOBIN: 8.9 g/dL — AB (ref 12.0–15.0)
MCH: 29.8 pg (ref 26.0–34.0)
MCHC: 32 g/dL (ref 30.0–36.0)
MCV: 93 fL (ref 78.0–100.0)
Platelets: 66 10*3/uL — ABNORMAL LOW (ref 150–400)
RBC: 2.99 MIL/uL — AB (ref 3.87–5.11)
RDW: 16.5 % — ABNORMAL HIGH (ref 11.5–15.5)
WBC: 8 10*3/uL (ref 4.0–10.5)

## 2014-03-16 LAB — BLOOD GAS, ARTERIAL
ACID-BASE EXCESS: 1.9 mmol/L (ref 0.0–2.0)
BICARBONATE: 25.5 meq/L — AB (ref 20.0–24.0)
Drawn by: 40530
O2 Content: 4 L/min
O2 SAT: 94.5 %
PATIENT TEMPERATURE: 97
PH ART: 7.466 — AB (ref 7.350–7.450)
TCO2: 26.6 mmol/L (ref 0–100)
pCO2 arterial: 35.4 mmHg (ref 35.0–45.0)
pO2, Arterial: 72.9 mmHg — ABNORMAL LOW (ref 80.0–100.0)

## 2014-03-16 LAB — COMPREHENSIVE METABOLIC PANEL
ALK PHOS: 249 U/L — AB (ref 39–117)
ALT: 56 U/L — ABNORMAL HIGH (ref 0–35)
AST: 266 U/L — ABNORMAL HIGH (ref 0–37)
Albumin: 1.7 g/dL — ABNORMAL LOW (ref 3.5–5.2)
Anion gap: 14 (ref 5–15)
BUN: 14 mg/dL (ref 6–23)
CALCIUM: 9.7 mg/dL (ref 8.4–10.5)
CO2: 26 mEq/L (ref 19–32)
Chloride: 107 mEq/L (ref 96–112)
Creatinine, Ser: 0.47 mg/dL — ABNORMAL LOW (ref 0.50–1.10)
GFR calc Af Amer: >90 mL/min (ref >90)
GLUCOSE: 109 mg/dL — AB (ref 70–99)
Potassium: 3.5 mEq/L — ABNORMAL LOW (ref 3.7–5.3)
Sodium: 147 mEq/L (ref 137–147)
Total Bilirubin: 0.6 mg/dL (ref 0.3–1.2)
Total Protein: 5.9 g/dL — ABNORMAL LOW (ref 6.0–8.3)

## 2014-03-16 LAB — MRSA PCR SCREENING: MRSA by PCR: POSITIVE — AB

## 2014-03-16 MED ORDER — MUPIROCIN 2 % EX OINT
1.0000 "application " | TOPICAL_OINTMENT | Freq: Two times a day (BID) | CUTANEOUS | Status: DC
  Filled 2014-03-16: qty 22

## 2014-03-16 MED ORDER — IOHEXOL 300 MG/ML  SOLN
100.0000 mL | Freq: Once | INTRAMUSCULAR | Status: AC | PRN
  Administered 2014-03-16: 100 mL via INTRAVENOUS

## 2014-03-16 MED ORDER — DRONABINOL 2.5 MG PO CAPS
2.5000 mg | ORAL_CAPSULE | Freq: Two times a day (BID) | ORAL | Status: DC
  Administered 2014-03-17 – 2014-03-19 (×2): 2.5 mg via ORAL
  Filled 2014-03-16 (×2): qty 1

## 2014-03-16 MED ORDER — ACETAMINOPHEN 325 MG PO TABS
650.0000 mg | ORAL_TABLET | Freq: Once | ORAL | Status: DC
  Administered 2014-03-16: 650 mg via ORAL
  Filled 2014-03-16: qty 2

## 2014-03-16 MED ORDER — IPRATROPIUM-ALBUTEROL 0.5-2.5 (3) MG/3ML IN SOLN
3.0000 mL | Freq: Three times a day (TID) | RESPIRATORY_TRACT | Status: DC
  Administered 2014-03-17 – 2014-03-27 (×30): 3 mL via RESPIRATORY_TRACT
  Filled 2014-03-16 (×31): qty 3

## 2014-03-16 MED ORDER — ALBUTEROL SULFATE (2.5 MG/3ML) 0.083% IN NEBU: 2.5000 mg | INHALATION_SOLUTION | Freq: Four times a day (QID) | RESPIRATORY_TRACT | Status: DC | PRN

## 2014-03-16 MED ORDER — ACETAMINOPHEN 325 MG PO TABS
650.0000 mg | ORAL_TABLET | Freq: Once | ORAL | Status: AC
  Administered 2014-03-16: 650 mg via ORAL
  Filled 2014-03-16: qty 2

## 2014-03-16 MED ORDER — MORPHINE SULFATE 2 MG/ML IJ SOLN
2.0000 mg | Freq: Once | INTRAMUSCULAR | Status: AC
  Administered 2014-03-16: 2 mg via INTRAVENOUS

## 2014-03-16 MED ORDER — IOHEXOL 300 MG/ML  SOLN
50.0000 mL | Freq: Once | INTRAMUSCULAR | Status: AC | PRN
  Administered 2014-03-16: 50 mL via ORAL

## 2014-03-16 MED ORDER — CHLORHEXIDINE GLUCONATE CLOTH 2 % EX PADS
6.0000 | MEDICATED_PAD | Freq: Every day | CUTANEOUS | Status: AC
  Administered 2014-03-17 – 2014-03-20 (×3): 6 via TOPICAL

## 2014-03-16 MED ORDER — MORPHINE SULFATE 2 MG/ML IJ SOLN
INTRAMUSCULAR | Status: AC
  Administered 2014-03-16: 2 mg via INTRAVENOUS
  Filled 2014-03-16: qty 1

## 2014-03-16 NOTE — Progress Notes (Signed)
Pt prepared for transfer to [facility] at 6pm. Report called at [place].

## 2014-03-16 NOTE — Progress Notes (Signed)
Subjective: Ms Petrasek had resolution of her AMS overnight. We discussed the events of yesterday and that she likely was drowzy from the ativan as she just had a brain MRI. We discussed the results of the MRIs which showed no brain involvement but extensive osseous metastases. She has not yet been seen by oncology since the diagnosis of small cell lung cancer returned but I called them this morning and they will see her. She also said she would like to talk to palliative care about goals of care and I have put in that consult as well. She says her only complaint is her back pain which is unchanged but that her breathing feels improved at least temporarily.  Objective: Vital signs in last 24 hours: Filed Vitals:   03/16/14 0006 03/16/14 0256 03/16/14 0419 03/16/14 0833  BP: 172/83 170/88 138/72 164/76  Pulse: 120 110 110 123  Temp: 98.4 F (36.9 C)  97 F (36.1 C)   TempSrc: Axillary  Axillary   Resp: 27 18 19 21   Height:      Weight:   134 lb 4.2 oz (60.9 kg)   SpO2: 96% 97% 98% 93%   Weight change:   Intake/Output Summary (Last 24 hours) at 03/16/14 1012 Last data filed at 03/16/14 0600  Gross per 24 hour  Intake    900 ml  Output      0 ml  Net    900 ml   Gen: A&O x 4,tearful and uncomfortable, thin  HEENT: Atraumatic, PERRL, EOMI, sclerae anicteric, dry mucous membranes  Heart: tachycardic but regular, normal S1 S2, no murmurs, rubs, or gallops  Lungs: poor effort, diminished breath sounds on the R but sounds clear, respirations unlabored  Abd: Soft, non-tender, non-distended, + bowel sounds, no hepatosplenomegaly  Ext: No edema or cyanosis  Lab Results: Basic Metabolic Panel:  Recent Labs Lab 03/15/14 0401 03/16/14 0710  NA 145 147  K 3.8 3.5*  CL 106 107  CO2 26 26  GLUCOSE 101* 109*  BUN 12 14  CREATININE 0.57 0.47*  CALCIUM 9.6 9.7   Liver Function Tests:  Recent Labs Lab 03/13/14 0714 03/16/14 0710  AST 83* 266*  ALT 16 56*  ALKPHOS 142* 249*    BILITOT 0.4 0.6  PROT 6.3 5.9*  ALBUMIN 1.7* 1.7*   CBC:  Recent Labs Lab 03/13/2014 1227  03/15/14 0401 03/16/14 0710  WBC 13.9*  < > 10.4 8.0  NEUTROABS 11.2*  --   --   --   HGB 11.3*  < > 9.0* 8.9*  HCT 35.1*  < > 28.2* 27.8*  MCV 94.9  < > 93.4 93.0  PLT 164  < > 111* 66*  < > = values in this interval not displayed. BNP:  Recent Labs Lab 02/19/2014 1515  PROBNP 769.4*   Urinalysis:  Recent Labs Lab 03/11/2014 1401  COLORURINE AMBER*  LABSPEC 1.017  PHURINE 6.0  GLUCOSEU NEGATIVE  HGBUR NEGATIVE  BILIRUBINUR MODERATE*  KETONESUR 40*  PROTEINUR NEGATIVE  UROBILINOGEN 1.0  NITRITE NEGATIVE  LEUKOCYTESUR SMALL*  many bacteria, many squamous epithelial cells, hyaline casts GGT: 32 Vit d, 25-hydroxy 64 PTH intact 8 Vitamin b12 540 TIBC 162 Iron 67 Folate 15 Ferritin 13426 Reticulocyte count 1%  Micro Results: Recent Results (from the past 240 hour(s))  URINE CULTURE     Status: None   Collection Time    02/21/2014  2:01 PM      Result Value Ref Range Status   Specimen Description  URINE, CLEAN CATCH   Final   Special Requests NONE   Final   Culture  Setup Time     Final   Value: 02/26/2014 21:10     Performed at [facility] Count     Final   Value: >=100,000 COLONIES/ML     Performed at [facility]   Culture     Final   Value: Multiple bacterial morphotypes present, none predominant. Suggest appropriate recollection if clinically indicated.     Performed at [facility]   Report Status 03/13/2014 FINAL   Final  CULTURE, BLOOD (ROUTINE X 2)     Status: None   Collection Time    03/05/2014  2:20 PM      Result Value Ref Range Status   Specimen Description BLOOD RIGHT ANTECUBITAL   Final   Special Requests BOTTLES DRAWN AEROBIC AND ANAEROBIC 5MLS   Final   Culture  Setup Time     Final   Value: 02/25/2014 20:52     Performed at [facility]   Culture     Final   Value:        BLOOD CULTURE RECEIVED NO  GROWTH TO DATE CULTURE WILL BE HELD FOR 5 DAYS BEFORE ISSUING A FINAL NEGATIVE REPORT     Performed at [facility]   Report Status PENDING   Incomplete  CULTURE, BLOOD (ROUTINE X 2)     Status: None   Collection Time    03/10/2014  3:15 PM      Result Value Ref Range Status   Specimen Description BLOOD RIGHT ANTECUBITAL   Final   Special Requests BOTTLES DRAWN AEROBIC AND ANAEROBIC 10MLS   Final   Culture  Setup Time     Final   Value: 02/25/2014 20:53     Performed at [facility]   Culture     Final   Value:        BLOOD CULTURE RECEIVED NO GROWTH TO DATE CULTURE WILL BE HELD FOR 5 DAYS BEFORE ISSUING A FINAL NEGATIVE REPORT     Performed at [facility]   Report Status PENDING   Incomplete  MRSA PCR SCREENING     Status: Abnormal   Collection Time    03/16/14  2:42 AM      Result Value Ref Range Status   MRSA by PCR POSITIVE (*) NEGATIVE Final   Comment:            The GeneXpert MRSA Assay (FDA     approved for NASAL specimens     only), is one component of a     comprehensive MRSA colonization     surveillance program. It is not     intended to diagnose MRSA     infection nor to guide or     monitor treatment for     MRSA infections.     RESULT CALLED TO, READ BACK BY AND VERIFIED WITH:     CALLED TO RN Jeri Lager [phone] @[phone]  THANEY   Studies/Results: Mr [name] Wo Contrast  03/15/2014   CLINICAL DATA:  Newly diagnosed lung mass.  Altered mental status.  EXAM: MRI HEAD WITHOUT AND WITH CONTRAST  TECHNIQUE: Multiplanar, multiecho pulse sequences of the brain and surrounding structures were obtained without and with intravenous contrast.  CONTRAST:  31mL MULTIHANCE GADOBENATE DIMEGLUMINE 529 MG/ML IV SOLN  COMPARISON:  None available.  FINDINGS: The diffusion-weighted images demonstrate no acute infarct of  the brain. There is diffuse signal abnormality within the calvarium compatible with widespread metastatic disease. This is confirmed on the  postcontrast images. Metastatic changes are noted within the upper cervical spine is well. The patient is status post fusion at C3-4 and C4-5.  No acute hemorrhage or mass lesion is present. The postcontrast images demonstrate no parenchymal or leptomeningeal enhancement. Mild atrophy and white matter disease is minimally advanced for age.  Flow is present in the major intracranial arteries. The globes and orbits are intact. The paranasal sinuses and mastoid air cells are clear.  IMPRESSION: 1. Mild age advanced white matter disease. This likely reflects the sequela of chronic microvascular ischemia. 2. No evidence for metastatic disease to the brain or meninges. 3. Widespread osseous metastatic disease including the upper cervical spine and calvarium.   Electronically Signed   By: Lawrence Santiago M.D.   On: 03/15/2014 13:14   Mr Lumbar Spine W Wo Contrast  03/15/2014   CLINICAL DATA:  Severe back pain with arthritis. Recently diagnosed with large right hilar mass, presumably bronchogenic carcinoma. Evaluate for arthritis versus metastatic disease. Initial encounter.  EXAM: MRI LUMBAR SPINE WITHOUT AND WITH CONTRAST  TECHNIQUE: Multiplanar and multiecho pulse sequences of the lumbar spine were obtained without and with intravenous contrast.  CONTRAST:  74mL MULTIHANCE GADOBENATE DIMEGLUMINE 529 MG/ML IV SOLN  COMPARISON:  Chest CT 03/16/2014.  Lumbar radiographs 02/26/2014.  FINDINGS: Five lumbar type vertebral bodies are assumed. The alignment is normal. There is no evidence of fracture or pars defect. There is widespread signal abnormality with heterogeneous enhancement throughout the lumbar spine and visualized pelvis consistent with extensive metastatic disease.  The conus medullaris extends to the L1 level and appears normal. There is no abnormal intradural enhancement or evidence of epidural tumor.  There are no significant disc space findings from T11-12 through L1-2.  L2-3: Mild loss of disc height with  annular bulging and facet hypertrophy. No significant spinal stenosis or nerve root encroachment.  L3-4: Annular disc bulging, facet and ligamentous hypertrophy contribute to mild spinal and right greater than left lateral recess stenosis. The foramina are sufficiently patent.  L4-5: Annular disc bulging, facet and ligamentous hypertrophy contribute to mild spinal and lateral recess stenosis bilaterally. The foramina are sufficiently patent.  L5-S1: Disc height and hydration are maintained. There is mild bilateral facet hypertrophy. No spinal stenosis or nerve root encroachment.  IMPRESSION: 1. Widespread metastatic disease to the lumbar spine and visualized pelvis. No pathologic fracture or epidural tumor demonstrated. 2. Relatively mild spondylosis with disc bulging, facet and ligamentous hypertrophy as described. At L3-4 and L4-5, there is mild spinal and lateral recess stenosis bilaterally.   Electronically Signed   By: Camie Patience M.D.   On: 03/15/2014 13:03   Medications: I have reviewed the patient's current medications. Scheduled Meds: . albuterol  2.5 mg Nebulization Q6H  . amitriptyline  100 mg Oral QHS  . butamben-tetracaine-benzocaine  1 spray Topical Once  . Chlorhexidine Gluconate Cloth  6 each Topical Q0600  . feeding supplement (ENSURE COMPLETE)  237 mL Oral BID BM  . folic acid  1 mg Oral Daily  . gabapentin  400 mg Oral Daily  . lidocaine  1 application Topical Once  . methotrexate  15 mg Oral Q Thu  . mupirocin ointment  1 application Nasal BID  . piperacillin-tazobactam (ZOSYN)  IV  3.375 g Intravenous Q8H  . polyethylene glycol  17 g Oral Daily  . predniSONE  10 mg Oral Q breakfast  .  sodium chloride  3 mL Intravenous Q12H  . vancomycin  500 mg Intravenous Q12H   Continuous Infusions: . sodium chloride 75 mL/hr at 03/16/14 0431   PRN Meds:.bisacodyl, HYDROcodone-acetaminophen, phenylephrine Assessment/Plan: Active Problems:   Pneumonia  #Small Cell Lung Cancer:  Preliminary path report of small cell lung cancer. MRI shows extensive metastases to the spine, pelvis, and calvarium. This is likely responsible for her SOB. Pulmonology said they called Dr Doylene Canard who would see her last night but he has not yet. We called oncology this morning who said they would see her but were unsure how to facilitate transfer to [facility]. We have also called palliative care. Symptomatically, scheduled albuterol is helping with relieving SOB and she is sating 93-98% on nasal cannula. Pain controlled with vicodin. -appreciate pulm, onc, palliative care -coordinate possible transfer to [facility] -f/u final bronch pathology/cytology -albuterol 2.5 mg neb q6h -vicodin 5-325 q4hprn -NS @ 75 cc/hr  -f/u BCx x 2  -cardiac monitoring   #AMS: Patient is alert and oriented x 4 this morning. This was likely 2/2 ativan for MRI sedation. She doesn't remember ever taking a benzodiazepine in the past and we encouraged her to tell people that she had this reaction and it has been added to her allergy list on EPIC. -cont neuro q2h checks   #Back Pain: MRI lumbar spine and brain reveal osseous metastases to the pelvis, lumbar spine, cervical spine, and calvarium. This fits with elevated alkaline phosphatase and normal GGT and hypercalcemia labs.  -see above  #Postobstructive Pneumonia: Ms Cange may have superimposed pneumonia secondary to obstructive effect or the mass is responsible for leukocytosis, fever, tachycardia. She remains afebrile with leukocytosis on presentation resolved  since beginning vanc/zosyn. She is ask risk as she is immunosuppressed from methotrexate and prednisone use 2/2 RA. -vanc/zosyn per pharmacy   #Rheumatoid Arthritis: Ms Fesler is on methotrexate 15 mg weekly on Thursdays and prednisone 10 mg daily. This may explain her back pain versus metastasis. -cont methotrexate 15 mg po weekly on Thursdays  -prednisone 30 mg daily x 3 days as stress dose  -vicodin 5-325 po q4hprn    #Thrombocytopenia/Anemia/ Elevated AST: Hemoglobin on presentation 11.3 now 9.0. Patient hemodynamically stable w no obvious bleeding. Anemia work-up notable for extremely elevated ferritin >13000 and  low TIBC (162). This may represent liver involvement of metastases which would also explain AST elevation. As her platelet count has decreased by >50% over several days since initiating heparin for DVT ppx, we have d/ced this and are using SCDs -appreciate onc -d/c heparin and use SCD -f/u [place] -cont to monitor  #s/p orthopedic neck surgery: Patient and significant other report she had neck surgery several years ago but could not describe further with no known complications  -cont gabapentin 400 mg daily presumed for this diagnsois   #Insomnia: Patient reports that she takes amitriptyline 100 mg daily for insomnia but question whether she is not forthcoming with any past psych history  -continue amitripyline 100 mg daily   #Asymptomatic bacteruria: Patient has many bacterial morphotypes with non predominant on UA but denies dysuria. She is immunosuppressed and we have begun abx for possible postobstructive PNA. This is likely contaminant. -cont abx per above   #Diet: regular  #DVT ppx: SCD  Dispo: Disposition is deferred at this time, awaiting improvement of current medical problems.  Anticipated discharge in approximately 1-2 day(s).   The patient does have a current PCP Charletta Cousin, MD) and does need an [id number] [facility] hospital follow-up appointment after  discharge.  The patient does not know have transportation limitations that hinder transportation to clinic appointments.  .Services Needed at time of discharge: Y = Yes, Blank = No PT:   OT:   RN:   Equipment:   Other:     LOS: 4 days   Kelby Aline, MD 03/16/2014, 10:12 AM

## 2014-03-16 NOTE — Progress Notes (Signed)
Dr. Clayburn Pert notified of patient blood pressure increasing since arriving to [facility]. Each blood pressure saved. No new orders given. Pt is still currently drowsy. Pt arousable and alert to self. Will continue to monitor.

## 2014-03-16 NOTE — Discharge Summary (Signed)
INTERNAL MEDICINE ATTENDING DISCHARGE COSIGN   I evaluated the patient on the day of discharge and discussed the discharge plan with my resident team. I agree with the discharge documentation and disposition.   Nyshawn Gowdy 03/16/2014, 3:12 PM

## 2014-03-16 NOTE — Progress Notes (Signed)
I saw Mrs. Delval today. Her family was with her. She is very very nice. She has been found to have small cell lung cancer. She clearly has metastatic disease. A CT of the chest was done which showed that she had a right hilar mass. She looks like she may have liver metastases. Her liver function tests are elevated. She has disease in her spine.  An MRI of the brain was negative.  She has been fairly active. Before she came to the hospital, she had been pretty active at home. She been pretty functional.  I think that she would be a good candidate for palliative chemotherapy. I talked to her and her family at length. I spent about an hour with them. I explained to them that small cell lung cancer is quite responsive to chemotherapy. We can treat this. However, we cannot cure this. She understands this very well.  She will need to have a Port-A-Cath put in. She does not have good peripheral access. I explained to them about chemotherapy. The standard, for her, would be carboplatinum/etoposide. This would be a 3 day regimen. This will be given every 3 weeks. She would need Neulasta post treatment for leukopenia.  I think that response rates with chemotherapy should be about 75%. We should see a fairly quick response for most patients.  Currently, her performance status is ECOG 2 her appetite has been down. I will try her on some Marinol to try to help with her appetite..  She has anemia with low platelets. One has to suspect that she probably has bone marrow involvement.  On her physical exam, her vital signs are temperature of 97.3. Pulse is 116. Blood pressure is 122/77. Her lungs show some scattered wheezes. Cardiac exam shows a tachycardic rate that is regular. Abdomen is soft. I really cannot palpate her liver. Extremities shows some trace edema in her legs. Neurological exam is nonfocal.  Again, she needs to be moved over to what the long hospital. She needs to have a Port-A-Cath placed. She  really needs to have a CT of the abdomen and pelvis so we can look at the extent of her liver disease.  Once she is was on hospital, and her Port-A-Cath was put in, we will write the orders for chemotherapy.  Pete E.  Romans 5:3-5

## 2014-03-16 NOTE — Progress Notes (Signed)
Pt seen and examined. Please refer to resident note for details  Pt now awake and oriented. Still with pain in lower back secondary to osseus mets.  Onc f/u noted. He spoke to patient and family and thinks patient is a candidate for palliative chemotherapy. Pt will need CT abd/pelvis to assess extent of liver disease (possible mets). Pt will need to be transferred to [facility] for chemotherapy for small cell lung cancer. Will attempt to arrange for transfer today

## 2014-03-17 ENCOUNTER — Inpatient Hospital Stay (HOSPITAL_COMMUNITY): Payer: Medicare Other

## 2014-03-17 ENCOUNTER — Encounter (HOSPITAL_COMMUNITY): Payer: Self-pay | Admitting: Radiology

## 2014-03-17 DIAGNOSIS — R918 Other nonspecific abnormal finding of lung field: Secondary | ICD-10-CM | POA: Diagnosis present

## 2014-03-17 DIAGNOSIS — J90 Pleural effusion, not elsewhere classified: Secondary | ICD-10-CM | POA: Diagnosis not present

## 2014-03-17 DIAGNOSIS — Z515 Encounter for palliative care: Secondary | ICD-10-CM

## 2014-03-17 DIAGNOSIS — R630 Anorexia: Secondary | ICD-10-CM | POA: Diagnosis present

## 2014-03-17 DIAGNOSIS — C801 Malignant (primary) neoplasm, unspecified: Secondary | ICD-10-CM | POA: Diagnosis present

## 2014-03-17 DIAGNOSIS — R110 Nausea: Secondary | ICD-10-CM

## 2014-03-17 DIAGNOSIS — C7951 Secondary malignant neoplasm of bone: Secondary | ICD-10-CM | POA: Diagnosis present

## 2014-03-17 DIAGNOSIS — J948 Other specified pleural conditions: Secondary | ICD-10-CM

## 2014-03-17 DIAGNOSIS — G893 Neoplasm related pain (acute) (chronic): Secondary | ICD-10-CM

## 2014-03-17 HISTORY — DX: Secondary malignant neoplasm of bone: C79.51

## 2014-03-17 LAB — VITAMIN D 1,25 DIHYDROXY
Vitamin D 1, 25 (OH)2 Total: 20 pg/mL (ref 18–72)
Vitamin D3 1, 25 (OH)2: 20 pg/mL

## 2014-03-17 LAB — ABO/RH: ABO/RH(D): O POS

## 2014-03-17 LAB — PREPARE RBC (CROSSMATCH)

## 2014-03-17 MED ORDER — HYDROMORPHONE HCL 1 MG/ML IJ SOLN
INTRAMUSCULAR | Status: AC
  Administered 2014-03-17: 1 mg
  Filled 2014-03-17: qty 1

## 2014-03-17 MED ORDER — HYDROMORPHONE HCL 1 MG/ML IJ SOLN
0.5000 mg | INTRAMUSCULAR | Status: DC | PRN
  Administered 2014-03-17 – 2014-03-18 (×4): 0.5 mg via INTRAVENOUS
  Filled 2014-03-17 (×4): qty 1

## 2014-03-17 MED ORDER — FUROSEMIDE 10 MG/ML IJ SOLN: 40.0000 mg | Freq: Once | INTRAMUSCULAR | Status: DC

## 2014-03-17 MED ORDER — SODIUM CHLORIDE 0.9 % IV SOLN
Freq: Once | INTRAVENOUS | Status: AC
  Administered 2014-03-17: 08:00:00 via INTRAVENOUS

## 2014-03-17 MED ORDER — OXYCODONE HCL 5 MG PO TABS
5.0000 mg | ORAL_TABLET | ORAL | Status: DC | PRN
  Administered 2014-03-19: 10 mg via ORAL
  Administered 2014-03-20: 5 mg via ORAL
  Administered 2014-03-21 – 2014-03-22 (×2): 10 mg via ORAL
  Administered 2014-03-22 (×2): 5 mg via ORAL
  Administered 2014-03-28: 10 mg via ORAL
  Filled 2014-03-17 (×3): qty 1
  Filled 2014-03-17 (×4): qty 2

## 2014-03-17 MED ORDER — FUROSEMIDE 10 MG/ML IJ SOLN
20.0000 mg | Freq: Once | INTRAMUSCULAR | Status: AC
  Administered 2014-03-17: 20 mg via INTRAVENOUS
  Filled 2014-03-17: qty 2

## 2014-03-17 MED ORDER — ALPRAZOLAM 0.25 MG PO TABS
0.2500 mg | ORAL_TABLET | Freq: Two times a day (BID) | ORAL | Status: DC | PRN
  Administered 2014-03-17 – 2014-03-28 (×4): 0.25 mg via ORAL
  Filled 2014-03-17 (×4): qty 1

## 2014-03-17 MED ORDER — HYDROMORPHONE HCL 1 MG/ML IJ SOLN: 1.0000 mg | Freq: Once | INTRAMUSCULAR | Status: AC

## 2014-03-17 MED ORDER — ONDANSETRON HCL 4 MG/2ML IJ SOLN
4.0000 mg | Freq: Four times a day (QID) | INTRAMUSCULAR | Status: DC | PRN
  Administered 2014-03-18 – 2014-03-20 (×2): 4 mg via INTRAVENOUS
  Filled 2014-03-17 (×2): qty 2

## 2014-03-17 MED ORDER — ALPRAZOLAM 0.5 MG PO TABS
ORAL_TABLET | ORAL | Status: AC
  Administered 2014-03-17: 0.5 mg
  Filled 2014-03-17: qty 1

## 2014-03-17 NOTE — H&P (Signed)
Reason for Consult: Port a catheter needed for chemotherapy.  Chief Complaint: Chief Complaint  Patient presents with  . Weakness    Referring Physician(s): Dr. Marin Olp  History of Present Illness: Debbie Howe is a 68 y.o. 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 Patient with probable metastatic disease with liver metastases and spine involvement and scheduled to start palliative chemotherapy this week. IR received request for port a catheter placement. She denies any chest pain, admits to shortness of breath and on 2L oxygen while in hospital. She denies any active signs of bleeding. She denies any recent fever or chills. The patient denies any history of sleep apnea or chronic oxygen use. She has previously tolerated sedation without complications.    Past Medical History  Diagnosis Date  . Rheumatoid arthritis     Past Surgical History  Procedure Laterality Date  . No past surgeries    . Video bronchoscopy Bilateral 02/15/2014    Procedure: VIDEO BRONCHOSCOPY WITHOUT FLUORO;  Surgeon: Wilhelmina Mcardle, MD;  Location: [facility] ENDOSCOPY;  Service: Cardiopulmonary;  Laterality: Bilateral;    Allergies: Ativan  Medications: Prior to Admission medications   Medication Sig Start Date End Date Taking? Authorizing Provider  amitriptyline (ELAVIL) 25 MG tablet Take 100 mg by mouth daily.   Yes Historical Provider, MD  Calcium Carb-Cholecalciferol (CALCIUM 600 + D PO) Take 1 tablet by mouth daily.   Yes Historical Provider, MD  folic acid (FOLVITE) 1 MG tablet Take 1 mg by mouth daily.   Yes Historical Provider, MD  gabapentin (NEURONTIN) 400 MG capsule Take 400 mg by mouth daily.   Yes Historical Provider, MD  methotrexate (RHEUMATREX) 2.5 MG tablet Take 15 mg by mouth once a week. On Thursday. Caution:Chemotherapy. Protect from light.   Yes Historical Provider, MD  predniSONE (DELTASONE) 5 MG tablet Take 10 mg by mouth daily with breakfast.   Yes Historical  Provider, MD  vitamin E 400 UNIT capsule Take 400 Units by mouth 2 (two) times daily.   Yes Historical Provider, MD    History reviewed. No pertinent family history.  History   Social History  . Marital Status: Single    Spouse Name: N/A    Number of Children: N/A  . Years of Education: N/A   Social History Main Topics  . Smoking status: Former Smoker    Quit date: 01/10/2013  . Smokeless tobacco: None  . Alcohol Use: Yes     Comment: occ  . Drug Use: No  . Sexual Activity: None   Other Topics Concern  . None   Social History Narrative    Review of Systems: A 12 point ROS discussed and pertinent positives are indicated in the HPI above.  All other systems are negative.  Review of Systems  Vital Signs: BP 145/75 mmHg  Pulse 114  Temp(Src) 97.7 F (36.5 C) (Oral)  Resp 20  Ht 5\' 2"  (1.575 m)  Wt 134 lb 4.2 oz (60.9 kg)  BMI 24.55 kg/m2  SpO2 93%  Physical Exam  Constitutional: She is oriented to person, place, and time.  Neck: No tracheal deviation present.  Cardiovascular: Exam reveals no gallop and no friction rub.   No murmur heard. Tachycardic   Pulmonary/Chest: Effort normal and breath sounds normal. No respiratory distress. She has no wheezes. She has no rales.  Abdominal: Soft. Bowel sounds are normal. She exhibits no distension. There is no tenderness.  Neurological: She is alert and oriented to person, place, and time.  Skin: Skin is warm and dry. She is not diaphoretic.  Psychiatric: She has a normal mood and affect. Her behavior is normal. Thought content normal.    Imaging: Ct Chest W Contrast  03/06/2014   CLINICAL DATA:  Shortness of breath. Right upper lobe consolidation.  EXAM: CT CHEST WITH CONTRAST  TECHNIQUE: Multidetector CT imaging of the chest was performed during intravenous contrast administration.  CONTRAST:  74mL OMNIPAQUE IOHEXOL 300 MG/ML  SOLN  COMPARISON:  02/22/2014; 06/18/2010  FINDINGS: Unfortunately there is extensive  mediastinal and right hilar adenopathy and right hilar mass causing obstruction of the right upper lobe bronchus and postobstructive pneumonitis/ drowned lung appearance. The super hilar component of the mass is difficult to measure given the similar density of surrounding atelectatic lung but based on the surrounding vessels is estimated to be about 5.4 by 4.1 cm. An upper right paratracheal lymph node measures 1.8 cm in short axis, image 13 series 2. A precarinal lymph node has indistinct margins but measures approximately 3 cm in short axis, image 23 series 2. Subcarinal node measures 3 cm in short axis, image 27 series 2. Infrahilar node on the right 2 cm in short axis, image 32 series 2.  There is some extrinsic mass effect on the right pulmonary artery and its branches. I cannot exclude invasion of the right pulmonary artery with bland or tumor thrombus in the right pulmonary artery on images 29 through 34 of series 2. Similar appearance on images 61 through 47 of series 6. Tumor and/or confluent nodal disease surrounds the bronchus intermedius and carina. 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 Paratracheal and right upper internal mammary adenopathy. A pathologic right axillary lymph node measures 1.7 cm in short axis, image 23 series 2.  Small right pleural effusion associated with a 0.9 by 0.7 cm pleural nodule medially along the right posterior pleura, image 45 series 2. There is also pleural nodularity further laterally at the same level measuring approximately 1.6 by 0.6 cm. Mild pleural nodularity along the right middle lobe. There is pleural nodularity along the right hemidiaphragm. Appearance compatible with malignant effusion.  Multiple abnormal hypodense lesions are present in the visualized right hepatic lobe. An index lesion in segment 8 measures 1.3 by 0.9 cm on image 49 of series 2. At least 2 other hypodense lesions are present in the liver. There is  scarring in the right mid kidney anteriorly. No adrenal mass.  A superior segment right lower lobe nodule measures 0.5 by 0.4 cm on image 16 of series 3.  Bilateral C7 cervical ribs. Right supraclavicular lymph node short axis diameter 1.6 cm, image 9 series 2. No definite osseous metastatic disease. Mild coronary artery atherosclerosis in the left main coronary.  IMPRESSION: 1. Unfortunately the patient has a large right hilar and suprahilar mass with extensive adenopathy in the mediastinum, right hilum, right infrahilar region, right axilla, and right supraclavicular region in addition to a small but malignant right pleural effusion. Appearance compatible with lung cancer, tissue diagnosis recommended. Hypodense liver lesions are not entirely specific but concerning for metastatic disease -nuclear medicine PET-CT may be helpful in determining the full extent of metastatic burden. Extensive postobstructive pneumonitis in the right upper lobe. 2. Right hilar mass is causing extrinsic compression on the right pulmonary artery to such a degree that I cannot completely exclude pulmonary arterial invasion with bland or tumor thrombus in the right pulmonary artery and its branches. Today's exam was not performed  with the pulmonary embolus protocol. 3. Superior segment right lower lobe nodule, 0.5 by 0.4 cm, nonspecific but possibly metastatic. 4. 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   Electronically Signed   By: Sherryl Barters M.D.   On: 02/16/2014 16:39   Mr [name] UV Contrast  03/15/2014   CLINICAL DATA:  Newly diagnosed lung mass.  Altered mental status.  EXAM: MRI HEAD WITHOUT AND WITH CONTRAST  TECHNIQUE: Multiplanar, multiecho pulse sequences of the brain and surrounding structures were obtained without and with intravenous contrast.  CONTRAST:  70mL MULTIHANCE  GADOBENATE DIMEGLUMINE 529 MG/ML IV SOLN  COMPARISON:  None available.  FINDINGS: The diffusion-weighted images demonstrate no acute infarct of the brain. There is diffuse signal abnormality within the calvarium compatible with widespread metastatic disease. This is confirmed on the postcontrast images. Metastatic changes are noted within the upper cervical spine is well. The patient is status post fusion at C3-4 and C4-5.  No acute hemorrhage or mass lesion is present. The postcontrast images demonstrate no parenchymal or leptomeningeal enhancement. Mild atrophy and white matter disease is minimally advanced for age.  Flow is present in the major intracranial arteries. The globes and orbits are intact. The paranasal sinuses and mastoid air cells are clear.  IMPRESSION: 1. Mild age advanced white matter disease. This likely reflects the sequela of chronic microvascular ischemia. 2. No evidence for metastatic disease to the brain or meninges. 3. Widespread osseous metastatic disease including the upper cervical spine and calvarium.   Electronically Signed   By: Lawrence Santiago M.D.   On: 03/15/2014 13:14   Mr Lumbar Spine W Wo Contrast  03/15/2014   CLINICAL DATA:  Severe back pain with arthritis. Recently diagnosed with large right hilar mass, presumably bronchogenic carcinoma. Evaluate for arthritis versus metastatic disease. Initial encounter.  EXAM: MRI LUMBAR SPINE WITHOUT AND WITH CONTRAST  TECHNIQUE: Multiplanar and multiecho pulse sequences of the lumbar spine were obtained without and with intravenous contrast.  CONTRAST:  72mL MULTIHANCE GADOBENATE DIMEGLUMINE 529 MG/ML IV SOLN  COMPARISON:  Chest CT 02/17/2014.  Lumbar radiographs 02/26/2014.  FINDINGS: Five lumbar type vertebral bodies are assumed. The alignment is normal. There is no evidence of fracture or pars defect. There is widespread signal abnormality with heterogeneous enhancement throughout the lumbar spine and visualized pelvis consistent  with extensive metastatic disease.  The conus medullaris extends to the L1 level and appears normal. There is no abnormal intradural enhancement or evidence of epidural tumor.  There are no significant disc space findings from T11-12 through L1-2.  L2-3: Mild loss of disc height with annular bulging and facet hypertrophy. No significant spinal stenosis or nerve root encroachment.  L3-4: Annular disc bulging, facet and ligamentous hypertrophy contribute to mild spinal and right greater than left lateral recess stenosis. The foramina are sufficiently patent.  L4-5: Annular disc bulging, facet and ligamentous hypertrophy contribute to mild spinal and lateral recess stenosis bilaterally. The foramina are sufficiently patent.  L5-S1: Disc height and hydration are maintained. There is mild bilateral facet hypertrophy. No spinal stenosis or nerve root encroachment.  IMPRESSION: 1. Widespread metastatic disease to the lumbar spine and visualized pelvis. No pathologic fracture or epidural tumor demonstrated. 2. Relatively mild spondylosis with disc bulging, facet and ligamentous hypertrophy as described. At L3-4 and L4-5, there is mild spinal and lateral recess stenosis bilaterally.   Electronically Signed   By: Rush Landmark  Lin Landsman M.D.   On: 03/15/2014 13:03   Dg Chest Port 1 View  02/15/2014   CLINICAL DATA:  Shortness of breath and nausea today.  EXAM: PORTABLE CHEST - 1 VIEW  COMPARISON:  Chest CT 06/18/2010  FINDINGS: Dense right upper lobe airspace consolidation could reflect lobar pneumonia or an obstructing mass (favored). Recommend chest CT with contrast for further evaluation. The left lung is clear. No pleural effusion. The bony thorax is intact.  IMPRESSION: Dense right upper lobe airspace consolidation could reflect lobar pneumonia or obstructing mass (favored). Recommend chest CT with contrast for further evaluation.   Electronically Signed   By: Kalman Jewels M.D.   On: 02/26/2014 13:13     Labs:  CBC:  Recent Labs  03/13/14 0714 02/22/2014 0545 03/15/14 0401 03/16/14 0710  WBC 10.1 11.0* 10.4 8.0  HGB 9.3* 8.9* 9.0* 8.9*  HCT 29.8* 28.4* 28.2* 27.8*  PLT 121* 109* 111* 66*    COAGS: No results for input(s): INR, APTT in the last 8760 hours.  BMP:  Recent Labs  03/13/14 0714 02/25/2014 0545 02/26/2014 1032 03/15/14 0401 03/16/14 0710  NA 136* 144  --  145 147  K 4.2 3.3*  --  3.8 3.5*  CL 99 103  --  106 107  CO2 21 27  --  26 26  GLUCOSE 73 85  --  101* 109*  BUN 15 11  --  12 14  CALCIUM 9.1 9.5 8.8 9.6 9.7  CREATININE 0.58 0.52  --  0.57 0.47*  GFRNONAA >90 >90  --  >90 >90  GFRAA >90 >90  --  >90 >90    LIVER FUNCTION TESTS:  Recent Labs  03/13/2014 1227 03/13/14 0714 03/16/14 0710  BILITOT 0.4 0.4 0.6  AST 76* 83* 266*  ALT 18 16 56*  ALKPHOS 140* 142* 249*  PROT 7.2 6.3 5.9*  ALBUMIN 2.2* 1.7* 1.7*    Assessment and Plan: Small cell lung cancer, right hilar mass s/p bronchoscopy biopsy.  Possible metastatic disease with liver metastases and spine involvement Request for port a catheter for palliative chemotherapy to be started on Tuesday  Patient will be NPO after midnight, no blood thinners given Thrombocytopenia, plt 66k today will recheck labs in am, may require platelet transfusion prior to port placement Anemia, getting 2 units of RBC today, check am labs.  Postobstructive PNA, received vancomycin 10/27-10/31-completed and still on Zosyn Q8hrs, afebrile, wbc wnl.  Risks and Benefits discussed with the patient. All of the patient's questions were answered, patient is agreeable to proceed. Consent signed and in chart.    Thank you for this interesting consult.  I greatly enjoyed meeting Tamantha Saline and look forward to participating in their care.   I spent a total of 40 minutes face to face in clinical consultation, greater than 50% of which was counseling/coordinating care   Signed: Hedy Jacob 03/17/2014, 9:13  AM

## 2014-03-17 NOTE — Consult Note (Signed)
Patient CH:ENIDPOEU Tasker      DOB: [birth year]      MPN:[id number]     Consult Note from the Palliative Medicine Team at [place] Requested by: Dr Ethelene Hal     PCP: Charletta Cousin, MD Reason for Consultation: Goals of Care    Phone [phone]  Assessment/Recommendations: 68 yo female with newly diagnosed extensive stage SCLC. Palliative consulted for goals of care.    1.  Code Status: Full  2. Goals of Care: Unfortunately not able to explore much today with patient. She was VERY drowsy after she was given 23m of IV dilaudid.  I spoke a bit today with her family including husband and son.  Plan to have port placed tomorrow and begin chemo next week.  We will continue to follow along.  Thank you for involving our service early in her diagnosis as we are happy to follow along and support her through treatment.   I have filled out FMLA paperwork for son today.  Will fu tomorrow.   3. Symptom Management:   1. Cancer Related Pain- mixed pain with some neuropathic component in legs. Fairly opioid naive.  Hydrocodone has taken edge off some but not great.  Received 163mof hydromorphone which is likely too large a dose for her (roughly equivalent to 2037mf hydrocodone). She was very drowsy after this.  Given that she will receive chemo, I will d/c vicodin as has tylenol and can mask fevers.  Will add oxycodone 5-1m9mN which should be a little stronger than vicodin.  Will have 0.5mg 13me of IV dilaudid as backup. Would continue her elavil and gabapentin (home meds) for now.  I am curious to see how she responds to chemo as this may make significant impact on her pain.  We could also consider bisphosphonates for her bony pain.  2. Loss of appetite- On steroids chronically for RA.  Reasonable that dronabinol was started. Can titrate up in few days if not much response. Next step would be 5mg B56m  3. H/O constipation- monitor with opioids. More loose stools past few days 4. Nausea-  will add PRN zofran  4. Psychosocial/Spiritual: lives in [place]  Christian faith. Has 2 sons.     Brief HPI:  68 yo 24male with PMHx of RA chronically on steroids who presented with ~1 month history of back pain and SOB.  Progressively worsening leading to ED presentation on 10/27. Has some chronic back pain with RA but does not take any opioid pain meds at home.  This pain was worse. She describes the pain as sharp and at times associated with numbness/tingling sensation into legs. She has also had associated orthopnea, night sweats, weight loss, decreased appetite.  Workup in hosptial revealed R hilar mass with widespread spine mets and concern for liver mets as well. MRI brain did not reveal brain mets.  She underwent bronchoscopy with bx, confirming diagnosis of SCLC.  She has met with Oncology and plans for port placement and chemo next week.  She is currently being treated for post-obs PNA as well.    ROS: Unable to obtain full ROS 2/2 opioid induced somnolence.     PMH:  Past Medical History  Diagnosis Date  . Rheumatoid arthritis      PSH: Past Surgical History  Procedure Laterality Date  . No past surgeries    . Video bronchoscopy Bilateral 03/08/2014    Procedure: VIDEO BRONCHOSCOPY WITHOUT FLUORO;  Surgeon: David Wilhelmina Mcardle  Location: [facility] ENDOSCOPY;  Service: Cardiopulmonary;  Laterality: Bilateral;   I have reviewed the [place] and [facility] and  If appropriate update it with new information. Allergies  Allergen Reactions  . Ativan [Lorazepam] Other (See Comments)    Altered mental status after ativan 1 mg iv   Scheduled Meds: . amitriptyline  100 mg Oral QHS  . Chlorhexidine Gluconate Cloth  6 each Topical Q0600  . dronabinol  2.5 mg Oral BID AC  . feeding supplement (ENSURE COMPLETE)  237 mL Oral BID BM  . folic acid  1 mg Oral Daily  . furosemide  20 mg Intravenous Once  . gabapentin  400 mg Oral Daily  . ipratropium-albuterol  3 mL Nebulization TID  .  piperacillin-tazobactam (ZOSYN)  IV  3.375 g Intravenous Q8H  . polyethylene glycol  17 g Oral Daily  . predniSONE  10 mg Oral Q breakfast  . sodium chloride  3 mL Intravenous Q12H   Continuous Infusions: . sodium chloride 75 mL/hr at 03/16/14 1855   PRN Meds:.albuterol, ALPRAZolam, bisacodyl, HYDROcodone-acetaminophen    BP 120/69 mmHg  Pulse 106  Temp(Src) 97.8 F (36.6 C) (Oral)  Resp 14  Ht _0  (1.575 m)  Wt 60.9 kg (134 lb 4.2 oz)  BMI 24.55 kg/m2  SpO2 97%   PPS: 60   Intake/Output Summary (Last 24 hours) at 03/17/14 1306 Last data filed at 03/17/14 1140  Gross per 24 hour  Intake   2200 ml  Output      0 ml  Net   2200 ml   Physical Exam:  General: Drowsy, NAD HEENT:  [place], sclera anciteric, mmm Neck: supple Chest:   CTAB CVS: tachycardia Abdomen: soft, ND Ext: no edema Neuro: moves all ext. Exam limited by drowsiness Skin: warm/dry  Labs: CBC    Component Value Date/Time   WBC 8.0 03/16/2014 0710   RBC 2.99* 03/16/2014 0710   RBC 3.27* 02/17/2014 1032   HGB 8.9* 03/16/2014 0710   HCT 27.8* 03/16/2014 0710   PLT 66* 03/16/2014 0710   MCV 93.0 03/16/2014 0710   MCH 29.8 03/16/2014 0710   MCHC 32.0 03/16/2014 0710   RDW 16.5* 03/16/2014 0710   LYMPHSABS 1.9 02/23/2014 1227   MONOABS 0.6 02/15/2014 1227   EOSABS 0.1 02/17/2014 1227   BASOSABS 0.1 02/27/2014 1227    BMET    Component Value Date/Time   NA 147 03/16/2014 0710   K 3.5* 03/16/2014 0710   CL 107 03/16/2014 0710   CO2 26 03/16/2014 0710   GLUCOSE 109* 03/16/2014 0710   BUN 14 03/16/2014 0710   CREATININE 0.47* 03/16/2014 0710   CALCIUM 9.7 03/16/2014 0710   CALCIUM 8.8 03/09/2014 1032   GFRNONAA >90 03/16/2014 0710   GFRAA >90 03/16/2014 0710    CMP     Component Value Date/Time   NA 147 03/16/2014 0710   K 3.5* 03/16/2014 0710   CL 107 03/16/2014 0710   CO2 26 03/16/2014 0710   GLUCOSE 109* 03/16/2014 0710   BUN 14 03/16/2014 0710   CREATININE 0.47* 03/16/2014  0710   CALCIUM 9.7 03/16/2014 0710   CALCIUM 8.8 03/09/2014 1032   PROT 5.9* 03/16/2014 0710   ALBUMIN 1.7* 03/16/2014 0710   AST 266* 03/16/2014 0710   ALT 56* 03/16/2014 0710   ALKPHOS 249* 03/16/2014 0710   BILITOT 0.6 03/16/2014 0710   GFRNONAA >90 03/16/2014 0710   GFRAA >90 03/16/2014 0710   10/27 CT Chest IMPRESSION: 1. Unfortunately the patient has  a large right hilar and suprahilar mass with extensive adenopathy in the mediastinum, right hilum, right infrahilar region, right axilla, and right supraclavicular region in addition to a small but malignant right pleural effusion. Appearance compatible with lung cancer, tissue diagnosis recommended. Hypodense liver lesions are not entirely specific but concerning for metastatic disease -nuclear medicine PET-CT may be helpful in determining the full extent of metastatic burden. Extensive postobstructive pneumonitis in the right upper lobe. 2. Right hilar mass is causing extrinsic compression on the right pulmonary artery to such a degree that I cannot completely exclude pulmonary arterial invasion with bland or tumor thrombus in the right pulmonary artery and its branches. Today's exam was not performed with the pulmonary embolus protocol. 3. Superior segment right lower lobe nodule, 0.5 by 0.4 cm, nonspecific but possibly metastatic. 4. 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   10/30 MR Brain IMPRESSION: 1. Mild age advanced white matter disease. This likely reflects the sequela of chronic microvascular ischemia. 2. No evidence for metastatic disease to the brain or meninges. 3. Widespread osseous metastatic disease including the upper cervical spine and calvarium.   10/30 MRI Spine IMPRESSION: 1. Widespread metastatic disease to the lumbar spine and visualized pelvis.  No pathologic fracture or epidural tumor demonstrated. 2. Relatively mild spondylosis with disc bulging, facet and ligamentous hypertrophy as described. At L3-4 and L4-5, there is mild spinal and lateral recess stenosis bilaterally.  10/31 CT Abd/Pelvis IMPRESSION: 1. Progressive enlargement of right pleural effusion with visible new atelectasis and collapse of the right lower lung. Correlation with chest x-ray a may be helpful as this may now represent a largely drowned right lung. 2. New small left pleural effusion. 3. 3 right hepatic lesions are identified, likely representing metastatic disease to the liver. The largest measures 1.5 cm. No other evidence of metastatic disease in the abdomen or pelvis.    Total Time: 90 minutes  Greater than 50%  of this time was spent counseling and coordinating care related to the above assessment and plan.  Doran Clay D.O. Palliative Medicine Team at [facility]  Pager: [phone] Team Phone: [phone]

## 2014-03-17 NOTE — Plan of Care (Signed)
Problem: Phase I Progression Outcomes Goal: Voiding-avoid urinary catheter unless indicated Outcome: Completed/Met Date Met:  03/17/14

## 2014-03-17 NOTE — Progress Notes (Addendum)
[facility] PROGRESS NOTE  Debbie Howe IZT:[id number] DOB: 12-24-1945 DOA: 03/13/2014 PCP: Charletta Cousin, MD Brief narrative 68 year old female with rheumatoid arthritis who was admitted to [facility] with shortness of breath. She was tachycardic and febrile on admission with leukocytosis.. Chest x-ray showed dense right upper airspace consolidation following which she had CT of the chest which showed a large right hilar and suprahilar mass with extensive lymphadenopathy.she underwent bronchoscopy with biopsy which was suggestive of small cell lung cancer. Further imaging with MRI of the brain ( no brain metastases), but with diffuse osseous metastases in the cervical and lumbar spine. Patient transferred to [facility] as per oncologist recommendation for chemotherapy.  Assessment/Plan: Metastatic small cell lung cancer Patient has diffuse osseous metastases to cervical and lumbar spine. She also has lesions in the liver as seen on CT scan of the abdomen. Also noted for right-sided pleural effusion. A Port-A-Cath will be placed in a.m. With plan to start chemotherapy on 11/2. Will obtain right-sided thoracentesis. Palliative care consulted for goals of care discussion by oncology.   Postobstructive pneumonia with  large right pleural effusion Continue empiric vancomycin and Zosyn started on admission. Continue O2 via nasal cannula and nebs. Chest x-ray this morning showing right-sided effusion with complete opacification likely parapneumonic versus malignant effusion. Will give 1 dose of 40 mg IV Lasix. Request for IR thoracentesis in am. Send pleural fluid for analysis.  Altered mental status on 10/30 Likely secondary to receiving Ativan for MRI. This has now resolved. I have placed her on low-dose Xanax for anxiety.  Pancytopenia Patient has anemia and thrombocytopenia likely from malignancy and possibly has involved the bone marrow as well. Monitor on  SCDs  Rheumatoid arthritis Continue outpatient methotrexate and prednisone.  Abdominal and knee pain. Continue when necessary oxycodone and Dilaudid. Patient had acute bilateral knee pain this morning. On exam does not have any knee swelling or tenderness and has good range of motion. Will monitor clinically.  Protein calorie malnutrition Continue feeding supplement.   Code Status: full code  Family Communication: husband and son at bedside  Disposition Plan: pending   Consultants:  Oncology  IR  Procedures:  Port A cath   Rt thoracentesis  Antibiotics:  IV vancomycin and Zosyn since 10/27  HPI/Subjective: Patient seen and examined this morning. In distress with pain in her knees and very anxious.   Objective: Filed Vitals:   03/17/14 1445  BP: 116/69  Pulse: 110  Temp: 98.5 F (36.9 C)  Resp: 14    Intake/Output Summary (Last 24 hours) at 03/17/14 1505 Last data filed at 03/17/14 1445  Gross per 24 hour  Intake   2625 ml  Output      0 ml  Net   2625 ml   Filed Weights   03/05/2014 1524 02/24/2014 1902 03/16/14 0419  Weight: 57.153 kg (126 lb) 60.328 kg (133 lb) 60.9 kg (134 lb 4.2 oz)    Exam:   General:  Elderly female in bed in distress with pain  Cardiovascular:S1&S2 tachycardic, no murmurs  Respiratory: markedly diminished breath sounds on right lung  Abdomen: soft, nondistended, nontender, bowel sounds present  Musculoskeletal: warm, no edema  CNS: Alert and oriented, tremulous  Data Reviewed: Basic Metabolic Panel:  Recent Labs Lab 02/15/2014 1227 03/13/14 0714 02/27/2014 0545 02/27/2014 1032 03/15/14 0401 03/16/14 0710  NA 140 136* 144  --  145 147  K 4.5 4.2 3.3*  --  3.8 3.5*  CL 94* 99 103  --  106 107  CO2 24 21 27   --  26 26  GLUCOSE 86 73 85  --  101* 109*  BUN 24* 15 11  --  12 14  CREATININE 0.54 0.58 0.52  --  0.57 0.47*  CALCIUM 10.6* 9.1 9.5 8.8 9.6 9.7   Liver Function Tests:  Recent Labs Lab 03/09/2014 1227  03/13/14 0714 03/16/14 0710  AST 76* 83* 266*  ALT 18 16 56*  ALKPHOS 140* 142* 249*  BILITOT 0.4 0.4 0.6  PROT 7.2 6.3 5.9*  ALBUMIN 2.2* 1.7* 1.7*   No results for input(s): LIPASE, AMYLASE in the last 168 hours. No results for input(s): AMMONIA in the last 168 hours. CBC:  Recent Labs Lab 03/16/2014 1227 03/13/14 0714 03/10/2014 0545 03/15/14 0401 03/16/14 0710  WBC 13.9* 10.1 11.0* 10.4 8.0  NEUTROABS 11.2*  --   --   --   --   HGB 11.3* 9.3* 8.9* 9.0* 8.9*  HCT 35.1* 29.8* 28.4* 28.2* 27.8*  MCV 94.9 95.2 93.1 93.4 93.0  PLT 164 121* 109* 111* 66*   Cardiac Enzymes: No results for input(s): CKTOTAL, CKMB, CKMBINDEX, TROPONINI in the last 168 hours. BNP (last 3 results)  Recent Labs  03/11/2014 1515  PROBNP 769.4*   CBG: No results for input(s): GLUCAP in the last 168 hours.  Recent Results (from the past 240 hour(s))  Urine culture     Status: None   Collection Time: 03/11/2014  2:01 PM  Result Value Ref Range Status   Specimen Description URINE, CLEAN CATCH  Final   Special Requests NONE  Final   Culture  Setup Time   Final    02/20/2014 21:10 Performed at [facility]   Final    >=100,000 COLONIES/ML Performed at [facility]   Culture   Final    Multiple bacterial morphotypes present, none predominant. Suggest appropriate recollection if clinically indicated. Performed at [facility]   Report Status 03/13/2014 FINAL  Final  Blood culture (routine x 2)     Status: None (Preliminary result)   Collection Time: 03/13/2014  2:20 PM  Result Value Ref Range Status   Specimen Description BLOOD RIGHT ANTECUBITAL  Final   Special Requests BOTTLES DRAWN AEROBIC AND ANAEROBIC 5MLS  Final   Culture  Setup Time   Final    03/10/2014 20:52 Performed at [facility]   Culture   Final           BLOOD CULTURE RECEIVED NO GROWTH TO DATE CULTURE WILL BE HELD FOR 5 DAYS BEFORE ISSUING A FINAL NEGATIVE REPORT Performed at  [facility]   Report Status PENDING  Incomplete  Blood culture (routine x 2)     Status: None (Preliminary result)   Collection Time: 03/11/2014  3:15 PM  Result Value Ref Range Status   Specimen Description BLOOD RIGHT ANTECUBITAL  Final   Special Requests BOTTLES DRAWN AEROBIC AND ANAEROBIC 10MLS  Final   Culture  Setup Time   Final    03/15/2014 20:53 Performed at [facility]   Culture   Final           BLOOD CULTURE RECEIVED NO GROWTH TO DATE CULTURE WILL BE HELD FOR 5 DAYS BEFORE ISSUING A FINAL NEGATIVE REPORT Performed at [facility]   Report Status PENDING  Incomplete  MRSA PCR Screening     Status: Abnormal   Collection Time: 03/16/14  2:42 AM  Result Value Ref Range  Status   MRSA by PCR POSITIVE (A) NEGATIVE Final    Comment:        The GeneXpert MRSA Assay (FDA approved for NASAL specimens only), is one component of a comprehensive MRSA colonization surveillance program. It is not intended to diagnose MRSA infection nor to guide or monitor treatment for MRSA infections. RESULT CALLED TO, READ BACK BY AND VERIFIED WITH: CALLED TO RN Jeri Lager [phone]  THANEY     Studies: Ct Abdomen Pelvis W Contrast  03/17/2014   CLINICAL DATA:  Small cell lung carcinoma, diffuse abdominal pain, nausea, vomiting.  EXAM: CT ABDOMEN AND PELVIS WITH CONTRAST  TECHNIQUE: Multidetector CT imaging of the abdomen and pelvis was performed using the standard protocol following bolus administration of intravenous contrast.  CONTRAST:  141mL OMNIPAQUE IOHEXOL 300 MG/ML SOLN, 58mL OMNIPAQUE IOHEXOL 300 MG/ML SOLN  COMPARISON:  CT of the chest on 02/18/2014.  FINDINGS: The visualized lung bases show progressive atelectasis and consolidation of the right lower lung with increase in right pleural fluid volume since the prior chest CT. There also is a new small left pleural effusion.  Right hepatic liver lesion measures 1.5 cm. Additional 1.0 cm and 0.9 cm lesions are  seen in the posterior right lobe. These are suspicious for liver metastases. No evidence of biliary obstruction. The gallbladder has been removed.  The pancreas, adrenal glands, spleen and kidneys are within normal limits. No enlarged lymph nodes are identified in the abdomen or pelvis. Bowel shows no evidence of obstruction, thickening or lesion. No hernias are seen. The bladder is unremarkable. Uterus and adnexal regions are unremarkable by CT. No bony lesions are identified.  IMPRESSION: 1. Progressive enlargement of right pleural effusion with visible new atelectasis and collapse of the right lower lung. Correlation with chest x-ray a may be helpful as this may now represent a largely drowned right lung. 2. New small left pleural effusion. 3. 3 right hepatic lesions are identified, likely representing metastatic disease to the liver. The largest measures 1.5 cm. No other evidence of metastatic disease in the abdomen or pelvis.   Electronically Signed   By: Aletta Edouard M.D.   On: 03/17/2014 09:46   Dg Chest Port 1 View  03/17/2014   CLINICAL DATA:  Right pleural effusion; difficulty breathing  EXAM: PORTABLE CHEST - 1 VIEW  COMPARISON:  Chest radiograph and chest CT March 12, 2014  FINDINGS: There is now essentially complete opacification of the right hemi thorax. The appearance is consistent with large right effusion superimposed on right upper lobe mass with right upper lobe consolidation, noted on recent chest radiograph and chest CT. On the left there is moderate diffuse interstitial prominence which in part may be due to interstitial edema in part may be due to or distribution of blood flow to a viable segments of lung. Heart size is within normal limits. Pulmonary vascularity on the left is within normal limits. Patient is status post plate fixation in the cervical spine.  IMPRESSION: Complete opacification of the right hemithorax due to a combination of right effusion and right upper lobe  consolidation with right upper lobe mass as noted on recent CT. Suspect a combination of interstitial edema and redistribution of blood flow the left lung. No airspace consolidation on the left.   Electronically Signed   By: Lowella Grip M.D.   On: 03/17/2014 13:37    Scheduled Meds: . amitriptyline  100 mg Oral QHS  . Chlorhexidine Gluconate Cloth  6 each Topical Q0600  .  dronabinol  2.5 mg Oral BID AC  . feeding supplement (ENSURE COMPLETE)  237 mL Oral BID BM  . folic acid  1 mg Oral Daily  . gabapentin  400 mg Oral Daily  . ipratropium-albuterol  3 mL Nebulization TID  . piperacillin-tazobactam (ZOSYN)  IV  3.375 g Intravenous Q8H  . polyethylene glycol  17 g Oral Daily  . predniSONE  10 mg Oral Q breakfast  . sodium chloride  3 mL Intravenous Q12H   Continuous Infusions: . sodium chloride 75 mL/hr at 03/16/14 1855      Time spent: 25 minutes    Debbie Howe, [place]  [facility] Pager [phone]. If 7PM-7AM, please contact night-coverage at [email or web address], password [facility] 03/17/2014, 3:05 PM  LOS: 5 days

## 2014-03-17 NOTE — Plan of Care (Signed)
Problem: Phase I Progression Outcomes Goal: Pain controlled with appropriate interventions Outcome: Progressing     

## 2014-03-17 NOTE — Progress Notes (Signed)
Debbie Howe is now over at [facility]. I very much appreciate the hospitalist helping [place] out. She is on monitored floor right now.  She did have a CT of the abdomen done. The report is not out yet. I cannot pull up the films on the computer. I'm sure with the updated Epic system, they still need to get the software readjusted.  She is anemic. She is thrombocytopenic. I suspect she probably has bone marrow involvement.her labs are not back yet area and however, her hemoglobin was 8.9 yesterday. I think she would benefit from 2 units of blood. We treat her with chemotherapy, she will become more anemic and I think this will stress or more. I think the 2 units of blood will help with her breathing.  She's not complaining of any pain.  She will  be getting her Port-A-Cath tomorrow. We can then start her on chemotherapy.  Her vital signs are all stable. She is slightly tachycardic at 114. She is afebrile. Her blood pressure is 145/75.  Her lungs show some decrease on the right side. Left side shows some scattered crackles. Cardiac exam is tachycardic but regular. She has no murmurs, rubs or bruits. Abdomen is soft. Her liver edge might be at the right costal margin. Extremities shows no clubbing, cyanosis or edema. Neurological exam is nonfocal.  We will go ahead and transfuse her with 2 units of blood today.  I will anticipate starting treatment on her on Tuesday.  Again, I am very appreciative of the great care that she's getting on 4 E.  Pete E.  Isaiah 41:10

## 2014-03-17 DEATH — deceased

## 2014-03-18 ENCOUNTER — Inpatient Hospital Stay (HOSPITAL_COMMUNITY): Payer: Medicare Other

## 2014-03-18 DIAGNOSIS — Z5111 Encounter for antineoplastic chemotherapy: Secondary | ICD-10-CM

## 2014-03-18 DIAGNOSIS — J918 Pleural effusion in other conditions classified elsewhere: Secondary | ICD-10-CM

## 2014-03-18 DIAGNOSIS — J189 Pneumonia, unspecified organism: Secondary | ICD-10-CM | POA: Diagnosis present

## 2014-03-18 LAB — BASIC METABOLIC PANEL
ANION GAP: 13 (ref 5–15)
BUN: 14 mg/dL (ref 6–23)
CHLORIDE: 104 meq/L (ref 96–112)
CO2: 25 mEq/L (ref 19–32)
Calcium: 8.5 mg/dL (ref 8.4–10.5)
Creatinine, Ser: 0.65 mg/dL (ref 0.50–1.10)
GFR calc Af Amer: >90 mL/min (ref >90)
GFR calc non Af Amer: 88 mL/min — ABNORMAL LOW (ref >90)
Glucose, Bld: 141 mg/dL — ABNORMAL HIGH (ref 70–99)
POTASSIUM: 4.4 meq/L (ref 3.7–5.3)
SODIUM: 142 meq/L (ref 137–147)

## 2014-03-18 LAB — GLUCOSE, SEROUS FLUID: GLUCOSE FL: 93 mg/dL

## 2014-03-18 LAB — BODY FLUID CELL COUNT WITH DIFFERENTIAL
Lymphs, Fluid: 79 %
MONOCYTE-MACROPHAGE-SEROUS FLUID: 7 % — AB (ref 50–90)
Neutrophil Count, Fluid: 14 % (ref 0–25)
Total Nucleated Cell Count, Fluid: 1095 cu mm — ABNORMAL HIGH (ref 0–1000)

## 2014-03-18 LAB — COMPREHENSIVE METABOLIC PANEL
ALT: 50 U/L — ABNORMAL HIGH (ref 0–35)
ANION GAP: 13 (ref 5–15)
AST: 103 U/L — ABNORMAL HIGH (ref 0–37)
Albumin: 1.7 g/dL — ABNORMAL LOW (ref 3.5–5.2)
Alkaline Phosphatase: 248 U/L — ABNORMAL HIGH (ref 39–117)
BUN: 14 mg/dL (ref 6–23)
CALCIUM: 9.6 mg/dL (ref 8.4–10.5)
CO2: 29 meq/L (ref 19–32)
CREATININE: 0.47 mg/dL — AB (ref 0.50–1.10)
Chloride: 101 mEq/L (ref 96–112)
GFR calc non Af Amer: >90 mL/min (ref >90)
GLUCOSE: 92 mg/dL (ref 70–99)
Potassium: 2.8 mEq/L — CL (ref 3.7–5.3)
Sodium: 143 mEq/L (ref 137–147)
TOTAL PROTEIN: 5.6 g/dL — AB (ref 6.0–8.3)
Total Bilirubin: 0.5 mg/dL (ref 0.3–1.2)

## 2014-03-18 LAB — CULTURE, BLOOD (ROUTINE X 2)
Culture: NO GROWTH
Culture: NO GROWTH

## 2014-03-18 LAB — CBC
HEMATOCRIT: 34.5 % — AB (ref 36.0–46.0)
HEMOGLOBIN: 11.3 g/dL — AB (ref 12.0–15.0)
MCH: 29.2 pg (ref 26.0–34.0)
MCHC: 32.8 g/dL (ref 30.0–36.0)
MCV: 89.1 fL (ref 78.0–100.0)
Platelets: 41 10*3/uL — ABNORMAL LOW (ref 150–400)
RBC: 3.87 MIL/uL (ref 3.87–5.11)
RDW: 16.7 % — ABNORMAL HIGH (ref 11.5–15.5)
WBC: 7.4 10*3/uL (ref 4.0–10.5)

## 2014-03-18 LAB — LACTATE DEHYDROGENASE, PLEURAL OR PERITONEAL FLUID: LD, Fluid: 757 U/L — ABNORMAL HIGH (ref 3–23)

## 2014-03-18 LAB — PROTIME-INR
INR: 1.13 (ref 0.00–1.49)
Prothrombin Time: 14.6 seconds (ref 11.6–15.2)

## 2014-03-18 LAB — PROTEIN, BODY FLUID: Total protein, fluid: 1.7 g/dL

## 2014-03-18 LAB — APTT: aPTT: 30 seconds (ref 24–37)

## 2014-03-18 LAB — LACTATE DEHYDROGENASE: LDH: 2900 U/L — ABNORMAL HIGH (ref 94–250)

## 2014-03-18 MED ORDER — SODIUM CHLORIDE 0.9 % IJ SOLN
10.0000 mL | INTRAMUSCULAR | Status: DC | PRN
  Administered 2014-03-24: 10 mL
  Filled 2014-03-18: qty 10

## 2014-03-18 MED ORDER — MIDAZOLAM HCL 2 MG/2ML IJ SOLN
INTRAMUSCULAR | Status: AC
  Filled 2014-03-18: qty 2

## 2014-03-18 MED ORDER — POTASSIUM CHLORIDE CRYS ER 20 MEQ PO TBCR
40.0000 meq | EXTENDED_RELEASE_TABLET | Freq: Once | ORAL | Status: AC
  Administered 2014-03-18: 40 meq via ORAL
  Filled 2014-03-18: qty 2

## 2014-03-18 MED ORDER — HEPARIN SOD (PORK) LOCK FLUSH 100 UNIT/ML IV SOLN
250.0000 [IU] | Freq: Once | INTRAVENOUS | Status: AC | PRN
  Filled 2014-03-18: qty 3

## 2014-03-18 MED ORDER — ALLOPURINOL 100 MG PO TABS
100.0000 mg | ORAL_TABLET | Freq: Every day | ORAL | Status: DC
  Administered 2014-03-18 – 2014-03-21 (×4): 100 mg via ORAL
  Filled 2014-03-18 (×4): qty 1

## 2014-03-18 MED ORDER — SODIUM CHLORIDE 0.9 % IV SOLN
Freq: Once | INTRAVENOUS | Status: AC
  Administered 2014-03-18: 18:00:00 via INTRAVENOUS

## 2014-03-18 MED ORDER — POTASSIUM CHLORIDE 10 MEQ/100ML IV SOLN
10.0000 meq | INTRAVENOUS | Status: AC
  Administered 2014-03-18 (×4): 10 meq via INTRAVENOUS
  Filled 2014-03-18 (×5): qty 100

## 2014-03-18 MED ORDER — LIDOCAINE HCL 1 % IJ SOLN
INTRAMUSCULAR | Status: AC
  Administered 2014-03-18: 16:00:00
  Filled 2014-03-18: qty 20

## 2014-03-18 MED ORDER — ALTEPLASE 2 MG IJ SOLR
2.0000 mg | Freq: Once | INTRAMUSCULAR | Status: AC | PRN
  Filled 2014-03-18: qty 2

## 2014-03-18 MED ORDER — SODIUM CHLORIDE 0.9 % IV SOLN
Freq: Once | INTRAVENOUS | Status: AC
  Administered 2014-03-18: 19:00:00 via INTRAVENOUS

## 2014-03-18 MED ORDER — IPRATROPIUM-ALBUTEROL 0.5-2.5 (3) MG/3ML IN SOLN
3.0000 mL | RESPIRATORY_TRACT | Status: DC | PRN
  Filled 2014-03-18: qty 3

## 2014-03-18 MED ORDER — SODIUM CHLORIDE 0.9 % IV SOLN
Freq: Once | INTRAVENOUS | Status: AC
  Administered 2014-03-18: 16 mg via INTRAVENOUS
  Filled 2014-03-18: qty 8

## 2014-03-18 MED ORDER — FUROSEMIDE 10 MG/ML IJ SOLN
10.0000 mg | Freq: Once | INTRAMUSCULAR | Status: AC
  Administered 2014-03-18: 10 mg via INTRAVENOUS
  Filled 2014-03-18: qty 2

## 2014-03-18 MED ORDER — PANTOPRAZOLE SODIUM 40 MG IV SOLR
40.0000 mg | Freq: Two times a day (BID) | INTRAVENOUS | Status: DC
  Administered 2014-03-18 – 2014-03-28 (×22): 40 mg via INTRAVENOUS
  Filled 2014-03-18 (×22): qty 40

## 2014-03-18 MED ORDER — SODIUM CHLORIDE 0.9 % IJ SOLN
3.0000 mL | INTRAMUSCULAR | Status: DC | PRN
Start: 2014-03-18 — End: 2014-03-29

## 2014-03-18 MED ORDER — HYDROMORPHONE HCL 1 MG/ML IJ SOLN
1.0000 mg | INTRAMUSCULAR | Status: DC | PRN
  Administered 2014-03-18 – 2014-03-19 (×3): 1 mg via INTRAVENOUS
  Filled 2014-03-18 (×3): qty 1

## 2014-03-18 MED ORDER — SODIUM CHLORIDE 0.9 % IV SOLN
80.0000 mg/m2 | Freq: Once | INTRAVENOUS | Status: AC
  Administered 2014-03-18: 130 mg via INTRAVENOUS
  Filled 2014-03-18: qty 6.5

## 2014-03-18 MED ORDER — CARBOPLATIN CHEMO INJECTION 450 MG/45ML
350.0000 mg | Freq: Once | INTRAVENOUS | Status: AC
  Administered 2014-03-18: 350 mg via INTRAVENOUS
  Filled 2014-03-18: qty 35

## 2014-03-18 MED ORDER — FLUCONAZOLE 100MG IVPB
100.0000 mg | INTRAVENOUS | Status: AC
  Administered 2014-03-19 – 2014-03-24 (×6): 100 mg via INTRAVENOUS
  Filled 2014-03-18 (×9): qty 50

## 2014-03-18 MED ORDER — HEPARIN SOD (PORK) LOCK FLUSH 100 UNIT/ML IV SOLN
500.0000 [IU] | Freq: Once | INTRAVENOUS | Status: AC | PRN
  Filled 2014-03-18: qty 5

## 2014-03-18 MED ORDER — FENTANYL CITRATE 0.05 MG/ML IJ SOLN
INTRAMUSCULAR | Status: AC
  Filled 2014-03-18: qty 2

## 2014-03-18 NOTE — Progress Notes (Addendum)
Patient ID: Debbie Howe, female   DOB: [birth year], 68 y.o.   MRN: [id number] [facility] PROGRESS NOTE  Debbie Howe VEL:[id number] DOB: May 05, 1946 DOA: 03/16/2014 PCP: Charletta Cousin, MD  Brief narrative: 68 year old female with past medical history of rheumatoid arthritis, depression who was admitted to [facility] 03/11/2014 with shortness of breath. She was tachycardic and febrile on admission with leukocytosis.. Chest x-ray showed dense right upper airspace consolidation following which she had CT of the chest which showed a large right hilar and suprahilar mass with extensive lymphadenopathy. Patient underwent bronchoscopy with biopsy which was suggestive of small cell lung cancer. 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 Patient was transferred from IM teaching service to [facility] 10/3/12015 as per oncologist recommendation for planned chemotherapy. Due to severity of patient's medical condition, ongoing thrombocytopenia, hypokalemia and plan for stat chemotherapy we will transfer the patient to SDU for further monitoring.   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 Her initial CXR 02/19/2014 dense right upper lobe airspace consolidation reflecting lobar pneumonia or obstructing mass (favored). CT chest revealed large right hilar and suprahilar mass with extensive adenopathy in the mediastinum, right hilum, right infrahilar region, right axilla, and right supraclavicularregion in addition to a small but malignant right pleural effusion.   Pt underwent bronchoscopy by Dr. Alva Garnet of [facility] and biopsy results consistent with small cell lung carcinoma.    Appreciate very much Dr. Marin Olp following and his plan is to start therapy today due to concern for progression of patient's disease. Please note other imaging study included CT abdomen concerning for metastatic spread to liver. MRI brain with evidence of osseous metastases, cervical and lumbar. Oncology will be putting orders for chemotherapy. Pt on allopurinol to prevent tumor lysis syndrome.   Order placed for therapeutic and diagnostic thoracentesis. Likely to be done 11/3 unless able to do it 11/2.   Continue duoneb TID and duoneb every 2 hours PRN shortness of breath or wheezing.   Oxygen support via [place] to keep O2 saturation above 90%.  Leukocytosis improved to normal WBC count.  Active Problems: Sepsis / Postobstructive pneumonia with large right pleural effusion  Sepsis criteria on admission met considering fever, tachycardia, leukocytosis and evidence of pneumonia. Pt started on empiric antibiotics on admission, Vanco and zosyn. Blood cultures to date are negative. Urine culture showed multiple organisms none predominant. Pt is currently on zosyn only. Vanco stopped 03/17/2014.   Chest x-ray 03/17/2014 showed right-sided effusion with complete opacification likely parapneumonic versus malignant effusion. Given 1 dose of 40 mg IV Lasix. For some reason order placed incorrectly for thoracentesis so wont be done today. Reordered and will be done in am, therapeutic and diagnostic, all labs ordered for diagnostic thoracentesis.   Acute encephalopathy on 10/30  Likely secondary to receiving Ativan for MRI. Mental status at baseline.  Continue xanax 0.25 mg PO BID PRN anxiety.   Pancytopenia / Anemia of chronic disease / Thrombocytopenia  Likely due to advanced malignancy. Continue to monitor CBC daily especially now that plan is for chemotherapy today.  No signs of bleeding. No current indications for transfusion.   Hypokalemia  Unclear etiology; one time dose lasix given before  but pt continuously  hypokalemic.   Potassium being supplemented IV route.   Rheumatoid arthritis  Continue outpatient methotrexate and prednisone.  Abdominal and knee pain.  CT abdomen showed possible liver metastases. Knee pain per patient better.  Continue current pain management efforts: dilaudid 1 mg every 2 hours IV PRN severe pain, oxycodone 5-10 mg every 4 hours PO PRN moderate pain. Adjuvant therapy: gabapentin.  Miralax daily while on pain meds.   Depression  Continue elavil.   Severe protein calorie malnutrition  Nutrition has been consulted. Continue Marinol, nutritional supplementation.   GI prophylaxis   PPI therapy considering multiple medications patient is taking including prednisone. Further, this am patient already complainted about very bad indigestion so will see if addition of PPI helps.   DVT Prophylaxis   SCD's bilaterally   Code Status: full code Family Communication: family not at the bedside this morning.  Disposition Plan: transfer to SDU for monitoring per oncology recommendations since treatment plan is for today.    IV Access:   Peripheral IV PAC to be placed today 03/18/2014  Procedures and diagnostic studies:   Bronchoscopy 02/16/2014  - Cytology brushings from RML and RUL; EBBx from RUL bronchus and a nodule from the R mainstem; TBNA targeting pretracheal and subtracheal lymph nodes; All specimens sent for cytology and surg path  Ct Abdomen Pelvis W Contrast 03/17/2014 1. Progressive enlargement of right pleural effusion with visible new atelectasis and collapse of the right lower lung. Correlation with chest x-ray a may be helpful as this may now represent a largely drowned right lung. 2. New small left pleural effusion. 3. 3 right hepatic lesions are identified, likely representing metastatic disease to the liver. The largest measures 1.5 cm. No other evidence of metastatic disease in the abdomen or pelvis.     Dg Chest Port 1 View  03/17/2014 Complete opacification of the right hemithorax due to a combination of right effusion and right upper lobe consolidation with right upper lobe mass as noted on recent CT. Suspect a combination of interstitial edema and redistribution of blood flow the left lung. No airspace consolidation on the left.   Order placed for therapeutic and diagnostic thoracentesis 03/18/2014. Pending.   Medical Consultants:   Oncology (Dr. Burney Gauze) Interventional Radiology  Palliative care (Dr. Timmie Foerster)  Other Consultants:   None   Anti-Infectives:   Zosyn 02/26/2014  Vancomycin 03/07/2014 --> 03/17/2014    Leisa Lenz, MD  [facility] Pager [phone]  If 7PM-7AM, please contact night-coverage [email or web address] Password [facility] 03/18/2014, 9:41 AM   LOS: 6 days    HPI/Subjective: No acute overnight events.  Objective: Filed Vitals:   03/17/14 1745 03/17/14 2100 03/18/14 0520 03/18/14 0748  BP: 155/79 144/74 154/80   Pulse: 110 113 120   Temp: 98.9 F (37.2 C) 98.7 F (37.1 C) 98.4 F (36.9 C)   TempSrc: Oral Oral Oral   Resp: _0 Height:      Weight:      SpO2: 94% 92% 92% 96%    Intake/Output Summary (Last 24 hours) at 03/18/14 0941 Last data filed at 03/18/14 0700  Gross per 24 hour  Intake   3025 ml  Output      0 ml  Net   3025 ml    Exam:   General:  Pt is alert, appears ill, no distress  Cardiovascular: tachycardic, S1/S2 appreciated   Respiratory: wheezing appreciated, diminished breath sounds.   Abdomen: Soft, non tender, non distended, bowel sounds present  Extremities:  No edema, pulses DP and PT palpable bilaterally  Neuro: Grossly nonfocal  Data Reviewed: Basic Metabolic Panel:  Recent Labs Lab 03/13/14 0714 02/27/2014 0545 02/23/2014 1032 03/15/14 0401 03/16/14 0710 03/18/14 0420  NA 136* 144  --  145 147 143  K 4.2 3.3*  --  3.8 3.5* 2.8*  CL 99 103  --  106 107 101  CO2 21 27  --  _0 GLUCOSE 73 85  --  101*  109* 92  BUN 15 11  --  _1 CREATININE 0.58 0.52  --  0.57 0.47* 0.47*  CALCIUM 9.1 9.5 8.8 9.6 9.7 9.6   Liver Function Tests:  Recent Labs Lab 02/14/2014 1227 03/13/14 0714 03/16/14 0710 03/18/14 0420  AST 76* 83* 266* 103*  ALT 18 16 56* 50*  ALKPHOS 140* 142* 249* 248*  BILITOT 0.4 0.4 0.6 0.5  PROT 7.2 6.3 5.9* 5.6*  ALBUMIN 2.2* 1.7* 1.7* 1.7*   No results for input(s): LIPASE, AMYLASE in the last 168 hours. No results for input(s): AMMONIA in the last 168 hours. CBC:  Recent Labs Lab 02/26/2014 1227 03/13/14 0714 03/09/2014 0545 03/15/14 0401 03/16/14 0710 03/18/14 0420  WBC 13.9* 10.1 11.0* 10.4 8.0 7.4  NEUTROABS 11.2*  --   --   --   --   --   HGB 11.3* 9.3* 8.9* 9.0* 8.9* 11.3*  HCT 35.1* 29.8* 28.4* 28.2* 27.8* 34.5*  MCV 94.9 95.2 93.1 93.4 93.0 89.1  PLT 164 121* 109* 111* 66* 41*   Cardiac Enzymes: No results for input(s): CKTOTAL, CKMB, CKMBINDEX, TROPONINI in the last 168 hours. BNP: Invalid input(s): POCBNP CBG: No results for input(s): GLUCAP in the last 168 hours.  Urine culture     Status: None   Collection Time: 03/13/2014  2:01 PM  Result Value Ref Range Status   Specimen Description URINE, CLEAN CATCH  Final    Multiple bacterial morphotypes present, none predominant. Suggest appropriate recollection if clinically indicated. Performed at [facility]   Report Status 03/13/2014 FINAL  Final  Blood culture (routine x 2)     Status: None (Preliminary result)   Collection Time: 02/19/2014  2:20 PM  Result Value Ref Range Status   Specimen Description BLOOD RIGHT ANTECUBITAL  Final           BLOOD CULTURE RECEIVED NO GROWTH TO DATE  Performed at [facility]   Report Status PENDING  Incomplete  Blood culture (routine x 2)     Status: None (Preliminary result)   Collection Time: 03/03/2014  3:15 PM  Result Value Ref Range Status   Specimen Description BLOOD RIGHT ANTECUBITAL  Final           BLOOD CULTURE RECEIVED NO GROWTH  TO DATE  Performed at [facility]   Report Status PENDING  Incomplete  MRSA PCR Screening     Status: Abnormal   Collection Time: 03/16/14  2:42 AM  Result Value Ref Range Status   MRSA by PCR POSITIVE (A) NEGATIVE Final    Comment:            Scheduled Meds: . allopurinol  100 mg Oral Daily  . amitriptyline  100 mg Oral QHS  . dronabinol  2.5 mg Oral BID AC  . feeding supplement   237 mL Oral BID BM  . folic acid  1 mg Oral Daily  . gabapentin  400 mg Oral Daily  . ipratropium-albuterol  3 mL Nebulization  TID  . piperacillin-tazobactam  3.375 g Intravenous Q8H  . polyethylene glycol  17 g Oral Daily  . potassium chloride  10 mEq Intravenous Q1 Hr x 4  . predniSONE  10 mg Oral Q breakfast   Continuous Infusions: . sodium chloride 75 mL/hr at 03/18/14 0608

## 2014-03-18 NOTE — Progress Notes (Signed)
Patient is noted having labored breathing, lungs sounds congested with raspy breathing. Maintained on oxygen per nasal cannula. Informed MD on call, lasix 10mg  IV given. We will continue to monitor patient.

## 2014-03-18 NOTE — Progress Notes (Signed)
Patient Debbie Howe      DOB: [birth year]      VFI:[id number]   Palliative Medicine Team at [facility] Progress Note    Subjective: Drowsy after procedure this afternoon (thora and picc placement).  To start chemo tomorrow. Having some mild pain but does not want pain medicine. Too drowsy to tell me much detail about her pain. No nausea, vomiting.       Filed Vitals:   03/18/14 1700  BP:   Pulse: 126  Temp:   Resp: 23   Physical exam: Gen: drowsy, but able to respond appropriately some HEENT: [place], sclera anciteric CV: Tachy, regular ABD: soft, ND EXT: no edema Skin: warm/dry  CBC    Component Value Date/Time   WBC 7.4 03/18/2014 0420   RBC 3.87 03/18/2014 0420   RBC 3.27* 03/11/2014 1032   HGB 11.3* 03/18/2014 0420   HCT 34.5* 03/18/2014 0420   PLT 41* 03/18/2014 0420   MCV 89.1 03/18/2014 0420   MCH 29.2 03/18/2014 0420   MCHC 32.8 03/18/2014 0420   RDW 16.7* 03/18/2014 0420   LYMPHSABS 1.9 03/10/2014 1227   MONOABS 0.6 02/16/2014 1227   EOSABS 0.1 02/16/2014 1227   BASOSABS 0.1 03/13/2014 1227    CMP     Component Value Date/Time   NA 143 03/18/2014 0420   K 2.8* 03/18/2014 0420   CL 101 03/18/2014 0420   CO2 29 03/18/2014 0420   GLUCOSE 92 03/18/2014 0420   BUN 14 03/18/2014 0420   CREATININE 0.47* 03/18/2014 0420   CALCIUM 9.6 03/18/2014 0420   CALCIUM 8.8 02/15/2014 1032   PROT 5.6* 03/18/2014 0420   ALBUMIN 1.7* 03/18/2014 0420   AST 103* 03/18/2014 0420   ALT 50* 03/18/2014 0420   ALKPHOS 248* 03/18/2014 0420   BILITOT 0.5 03/18/2014 0420   GFRNONAA >90 03/18/2014 0420   GFRAA >90 03/18/2014 0420       Assessment and plan: 68 yo female with newly diagnosed extensive stage SCLC. Palliative consulted for goals of care.   1. Code Status: Full  2. Goals of Care: Drowsy again today after procedure. Plan for chemo to start tomorrow. Long discussion today with family about planned treatment.  One family member in particular very  concerned about her ability to tolerate chemo. Talked about how these are valid concerns and that chemotherapy does not come without risk.  We also talked that the intent of chemo would be to help her live longer and also to feel better.  Talked some about illness trajectory of SCLC and how at least initially it can be very responsive to chemo. They also wonder about future treatment options and I discussed how a lot of that will depend on how she is doing at that time and ongoing conversations with Oncology who will also help guide them in these decisions.  We re-discussed importance of advance care planning as well.    3. Symptom Management:  1. Cancer Related Pain- did not take any oral oxycodone. Dilaudid subsequently increased to 1mg . She is drowsy.  Will monitor closely.  Would be cautious with IV dilaudid as she was very drowsy with 1mg  dose yesterday when I saw her.  Hopefully can find right balance of pain control and alertness.  2. Loss of appetite- On steroids chronically for RA. Reasonable that dronabinol was started. Can titrate up in few days if not much response. Next step would be 5mg  BID. 3. H/O constipation- monitor with opioids. More loose stools past few days  4. Nausea- PRN zofran  4. Psychosocial/Spiritual: lives in [place] area. Christian faith. Has 2 sons.    Total Time: 35 minutes  >50% of time spent in counseling and coordination of care regarding above assessment and plan.

## 2014-03-18 NOTE — Progress Notes (Signed)
Report called to step down unit.  Pt and family aware of room reassignment.  Paged Dr. Charlies Silvers and new orders received for pain control and Foley cath insertion.  Coolidge Breeze, RN 03/18/2014

## 2014-03-18 NOTE — Progress Notes (Signed)
ANTIBIOTIC CONSULT NOTE - FOLLOW UP  Pharmacy Consult for Zosyn Indication: postobstructive pneumonia  Allergies  Allergen Reactions  . Ativan [Lorazepam] Other (See Comments)    Altered mental status after ativan 1 mg iv    Patient Measurements: Height: 5\' 2"  (157.5 cm) Weight: 134 lb 4.2 oz (60.9 kg) IBW/kg (Calculated) : 50.1  Vital Signs: Temp: 98.4 F (36.9 C) (11/02 0520) Temp Source: Oral (11/02 0520) BP: 154/80 mmHg (11/02 0520) Pulse Rate: 120 (11/02 0520) Intake/Output from previous day: 11/01 0701 - 11/02 0700 In: 3265 [P.O.:580; I.V.:1925; Blood:660; IV Piggyback:100] Out: -  Intake/Output from this shift:    Labs:  Recent Labs  03/16/14 0710 03/18/14 0420  WBC 8.0 7.4  HGB 8.9* 11.3*  PLT 66* 41*  CREATININE 0.47* 0.47*   Estimated Creatinine Clearance: 56.2 mL/min (by C-G formula based on Cr of 0.47). No results for input(s): VANCOTROUGH, VANCOPEAK, VANCORANDOM, GENTTROUGH, GENTPEAK, GENTRANDOM, TOBRATROUGH, TOBRAPEAK, TOBRARND, AMIKACINPEAK, AMIKACINTROU, AMIKACIN in the last 72 hours.     Assessment: 86 yoF with newly diagnosed metastatic SCLC. Pharmacy consulted to dose vanc/zosyn for postobstructive pneumonia. Plan to begin palliative chemo 11/2 or 11/3. PAC placement and thoracentesis scheduled for 11/2.  10/27 azith/ctx x 1 10/27 >> Zosyn >> 10/27 >> Vanc >> 10/31  Tmax: AF WBC: WNL Renal: SCr low, CrCl~62 (CG, using adjusted SCr 0.8 for age)  10/31 MRSA screen (+) 10/27 urine cx: >100K cfu/ml multiple species 10/27 blood x 2: ngtd  Today is day #7 Zosyn 3.375g IV q8h (4 hour infusion time).  Goal of Therapy:  Doses adjusted per renal function Eradication of infection  Plan:  1.  Continue Zosyn 3.375g IV q8h (4 hour infusion time). 2.  F/u planned duration of treatment.  Hershal Coria 03/18/2014,11:05 AM

## 2014-03-18 NOTE — Progress Notes (Signed)
CRITICAL VALUE ALERT  Critical value received:  Potassium 2.8  Date of notification:  03/18/2014  Time of notification:  0515  Critical value read back:Yes.    Nurse who received alert:  Daleen Bo RN  MD notified (1st page):  Hal Hope MD  Time of first page:  0530  MD notified (2nd page):  Time of second page:  Responding MD:  Hal Hope MD  Time MD responded:  [phone]

## 2014-03-18 NOTE — Progress Notes (Addendum)
0835- Called interventional radiology, spoke with Hannah Beat.  Pt is on their schedule for port placement today but they are unsure what time she will be done.  Either they or ultrasound dept will also complete the thoracentesis at that time.  Juliann Pulse estimated they would call for the patient this afternoon, earlier if at all possible.  Pt's husband informed of these details (pt sleeping).  Coolidge Breeze, RN 03/18/2014  [phone] - Call received from Rowe Robert, [place] for interventional radiology.  After reviewing pt's lab results they will be unable to insert PAC unless pt receives platelets prior to procedure.  Called Dr. Antonieta Pert office and notified his nurse.  Will await further instructions.  Coolidge Breeze, RN 03/18/2014  0930 - Call back received from Dr. Antonieta Pert office.  They will enter platelet orders. Will await orders.  Coolidge Breeze, RN 03/18/2014

## 2014-03-18 NOTE — Progress Notes (Signed)
Mrs. Ihnen sounds more congested. She did have some blood yesterday. Her hemoglobin is now up to 11.3 Her platelet count is down to 41. Her LDH is 2900.  I think it is clear that her  cancer is progressing quickly. Her potassium is 2.8. She probably needs to get started on treatment today. Her CT of the abdomen and pelvis only showed 3 hepatic lesions. She has worsening collapse of the right lung.  Again, I have to suspect that she has extensive bone marrow involvement. The LDH being so high typically is a sign of this.  I will start her on allopurinol. I think tumor lysis can certainly be a big problem for her.  She needs to have her Port-A-Cath put in today.  Her appetite is marginal. She's not having any nausea or vomiting.  I relies that she is up on the monitored floor. I think she will have to be either on the monitor floor or in the ICU for therapy. I don't feel confident about moving her down to 3 W. Given her current status.  Her blood pressure is 154/80.  She may benefit from a Foley catheter.  This is definitely a very tenuous situation. Again, it is apparent, to me, that she has disease that is quite aggressive. We really need to get started on treatment quickly.  On her exam, she has a lot of wheezing. There is decreased breath sounds over on the right side. Cardiac exam is tachycardic but regular. Abdomen is soft. I really cannot palpate her liver edge.  Again, I think we are approaching a critical juncture in which we really have to get treatment started. The orders are all written.  I appreciate all the wonderful care that she is getting.  Pete E.  Ephesians 6:9-10

## 2014-03-18 NOTE — Sedation Documentation (Signed)
Due to patient resp status sats 84-91% on 6L/ port catth is being postponed until after thoracentisis is done.  Pt placed on 100% NRB mask Sats now 99%

## 2014-03-18 NOTE — Procedures (Signed)
RUE PICC SVC RA 40 cm R thora 600 cc No comp

## 2014-03-18 NOTE — Progress Notes (Signed)
CARE MANAGEMENT NOTE 03/18/2014  Patient:  Debbie Howe, Debbie Howe   Account Number:  [id number]  Date Initiated:  03/15/2014  Documentation initiated by:  Tomi Bamberger  Subjective/Objective Assessment:   dx pna  admit- from home.     Action/Plan:   Anticipated DC Date:  03/21/2014   Anticipated DC Plan:  [place]  CM consult      Choice offered to / List presented to:             Status of service:  In process, will continue to follow Medicare Important Message given?  YES (If response is "NO", the following Medicare IM given date fields will be blank) Date Medicare IM given:  03/15/2014 Medicare IM given by:  Tomi Bamberger Date Additional Medicare IM given:  03/18/2014 Additional Medicare IM given by:  [facility]  Discharge Disposition:    Per UR Regulation:  Reviewed for med. necessity/level of care/duration of stay  If discussed at Long Length of Stay Meetings, dates discussed:    Comments:  03/18/14 Jackey Housey RN,BSN NCM [place].S/P PICC, & THORACENTESIS.FOR CHEMO IN AM.NEW EP:PIRJJOACZ NSCL LUNG CA.FULL CODE.PALLIATIVE FOLLOWING.   03/15/14 [place] [id number] patient is from home, NCM will contiunue to follow for dc needs.

## 2014-03-19 ENCOUNTER — Encounter (HOSPITAL_COMMUNITY): Payer: Self-pay | Admitting: Internal Medicine

## 2014-03-19 DIAGNOSIS — F32A Depression, unspecified: Secondary | ICD-10-CM | POA: Diagnosis present

## 2014-03-19 DIAGNOSIS — Z22322 Carrier or suspected carrier of Methicillin resistant Staphylococcus aureus: Secondary | ICD-10-CM

## 2014-03-19 DIAGNOSIS — E876 Hypokalemia: Secondary | ICD-10-CM | POA: Diagnosis present

## 2014-03-19 DIAGNOSIS — M069 Rheumatoid arthritis, unspecified: Secondary | ICD-10-CM | POA: Diagnosis present

## 2014-03-19 DIAGNOSIS — D638 Anemia in other chronic diseases classified elsewhere: Secondary | ICD-10-CM

## 2014-03-19 DIAGNOSIS — D696 Thrombocytopenia, unspecified: Secondary | ICD-10-CM | POA: Diagnosis present

## 2014-03-19 DIAGNOSIS — G9340 Encephalopathy, unspecified: Secondary | ICD-10-CM | POA: Diagnosis not present

## 2014-03-19 DIAGNOSIS — E43 Unspecified severe protein-calorie malnutrition: Secondary | ICD-10-CM

## 2014-03-19 DIAGNOSIS — F329 Major depressive disorder, single episode, unspecified: Secondary | ICD-10-CM | POA: Diagnosis present

## 2014-03-19 HISTORY — DX: Carrier or suspected carrier of methicillin resistant Staphylococcus aureus: Z22.322

## 2014-03-19 HISTORY — DX: Unspecified severe protein-calorie malnutrition: E43

## 2014-03-19 LAB — CBC
HCT: 21.7 % — ABNORMAL LOW (ref 36.0–46.0)
HEMOGLOBIN: 7.2 g/dL — AB (ref 12.0–15.0)
MCH: 30.1 pg (ref 26.0–34.0)
MCHC: 33.2 g/dL (ref 30.0–36.0)
MCV: 90.8 fL (ref 78.0–100.0)
Platelets: 78 10*3/uL — ABNORMAL LOW (ref 150–400)
RBC: 2.39 MIL/uL — ABNORMAL LOW (ref 3.87–5.11)
RDW: 16.9 % — ABNORMAL HIGH (ref 11.5–15.5)
WBC: 5.7 10*3/uL (ref 4.0–10.5)

## 2014-03-19 LAB — PREPARE RBC (CROSSMATCH)

## 2014-03-19 LAB — COMPREHENSIVE METABOLIC PANEL
ALBUMIN: 1.6 g/dL — AB (ref 3.5–5.2)
ALT: 81 U/L — AB (ref 0–35)
AST: 228 U/L — ABNORMAL HIGH (ref 0–37)
Alkaline Phosphatase: 212 U/L — ABNORMAL HIGH (ref 39–117)
Anion gap: 12 (ref 5–15)
BUN: 28 mg/dL — ABNORMAL HIGH (ref 6–23)
CALCIUM: 9.1 mg/dL (ref 8.4–10.5)
CO2: 27 mEq/L (ref 19–32)
Chloride: 105 mEq/L (ref 96–112)
Creatinine, Ser: 0.79 mg/dL (ref 0.50–1.10)
GFR calc non Af Amer: 82 mL/min — ABNORMAL LOW (ref >90)
GLUCOSE: 125 mg/dL — AB (ref 70–99)
POTASSIUM: 4.3 meq/L (ref 3.7–5.3)
Sodium: 144 mEq/L (ref 137–147)
TOTAL PROTEIN: 4.9 g/dL — AB (ref 6.0–8.3)
Total Bilirubin: 0.4 mg/dL (ref 0.3–1.2)

## 2014-03-19 LAB — ALBUMIN, FLUID (OTHER): ALBUMIN FL: 0.8 g/dL

## 2014-03-19 LAB — PH, BODY FLUID: PH, FLUID: 8

## 2014-03-19 LAB — PREPARE PLATELET PHERESIS: UNIT DIVISION: 0

## 2014-03-19 MED ORDER — SODIUM CHLORIDE 0.9 % IV SOLN
Freq: Once | INTRAVENOUS | Status: AC
  Administered 2014-03-23: 11:00:00 via INTRAVENOUS

## 2014-03-19 MED ORDER — FUROSEMIDE 10 MG/ML IJ SOLN
20.0000 mg | Freq: Once | INTRAMUSCULAR | Status: AC
  Administered 2014-03-19: 20 mg via INTRAVENOUS
  Filled 2014-03-19: qty 2

## 2014-03-19 MED ORDER — SODIUM CHLORIDE 0.9 % IJ SOLN: 3.0000 mL | INTRAMUSCULAR | Status: DC | PRN

## 2014-03-19 MED ORDER — ALTEPLASE 2 MG IJ SOLR: 2.0000 mg | Freq: Once | INTRAMUSCULAR | Status: AC | PRN

## 2014-03-19 MED ORDER — SODIUM CHLORIDE 0.9 % IV SOLN
80.0000 mg/m2 | Freq: Once | INTRAVENOUS | Status: AC
  Administered 2014-03-19: 130 mg via INTRAVENOUS
  Filled 2014-03-19: qty 6.5

## 2014-03-19 MED ORDER — HEPARIN SOD (PORK) LOCK FLUSH 100 UNIT/ML IV SOLN: 500.0000 [IU] | Freq: Once | INTRAVENOUS | Status: AC | PRN

## 2014-03-19 MED ORDER — SODIUM CHLORIDE 0.9 % IV SOLN
Freq: Once | INTRAVENOUS | Status: AC
  Administered 2014-03-19: 500 mL via INTRAVENOUS

## 2014-03-19 MED ORDER — HYDROMORPHONE HCL 1 MG/ML IJ SOLN
0.5000 mg | INTRAMUSCULAR | Status: DC | PRN
  Administered 2014-03-19: 1 mg via INTRAVENOUS
  Administered 2014-03-20: 0.5 mg via INTRAVENOUS
  Administered 2014-03-20 (×2): 1 mg via INTRAVENOUS
  Administered 2014-03-20 – 2014-03-21 (×2): 0.5 mg via INTRAVENOUS
  Administered 2014-03-23: 1 mg via INTRAVENOUS
  Administered 2014-03-23: 0.5 mg via INTRAVENOUS
  Administered 2014-03-23: 1 mg via INTRAVENOUS
  Administered 2014-03-23: 0.5 mg via INTRAVENOUS
  Administered 2014-03-24 (×3): 1 mg via INTRAVENOUS
  Administered 2014-03-24: 0.5 mg via INTRAVENOUS
  Administered 2014-03-25 (×3): 1 mg via INTRAVENOUS
  Filled 2014-03-19 (×20): qty 1

## 2014-03-19 MED ORDER — PROCHLORPERAZINE MALEATE 10 MG PO TABS
10.0000 mg | ORAL_TABLET | Freq: Once | ORAL | Status: AC
  Administered 2014-03-19: 10 mg via ORAL
  Filled 2014-03-19: qty 1

## 2014-03-19 MED ORDER — SODIUM CHLORIDE 0.9 % IJ SOLN: 10.0000 mL | INTRAMUSCULAR | Status: DC | PRN

## 2014-03-19 MED ORDER — HOT PACK MISC ONCOLOGY
1.0000 | Freq: Once | Status: AC | PRN
  Filled 2014-03-19: qty 1

## 2014-03-19 MED ORDER — DRONABINOL 2.5 MG PO CAPS
5.0000 mg | ORAL_CAPSULE | Freq: Two times a day (BID) | ORAL | Status: DC
  Administered 2014-03-20 – 2014-03-28 (×7): 5 mg via ORAL
  Filled 2014-03-19 (×9): qty 2

## 2014-03-19 MED ORDER — HEPARIN SOD (PORK) LOCK FLUSH 100 UNIT/ML IV SOLN: 250.0000 [IU] | Freq: Once | INTRAVENOUS | Status: AC | PRN

## 2014-03-19 NOTE — Progress Notes (Signed)
Patient Debbie Howe      DOB: 06-02-1945      NOM:[id number]   Palliative Medicine Team at [facility] Progress Note    Subjective: Little more awake today. Feels like pain is well controlled. Mostly mild leg pain in thighs. Tough to describe but no burning component. Moved bowels 2 days ago. No N/V. Appetite poor. Has some conversational dyspnea which is not associated with hypoxia.     Filed Vitals:   03/19/14 1420  BP: 106/84  Pulse: 116  Temp: 98.5 F (36.9 C)  Resp: 11   Physical exam: Gen: Alert, NAD HEENT: [place], sclera anciteric CV: Tachy, regular ABD: soft, ND EXT: no edema Skin: warm/dry  CBC    Component Value Date/Time   WBC 5.7 03/19/2014 0405   RBC 2.39* 03/19/2014 0405   RBC 3.27* 03/05/2014 1032   HGB 7.2* 03/19/2014 0405   HCT 21.7* 03/19/2014 0405   PLT 78* 03/19/2014 0405   MCV 90.8 03/19/2014 0405   MCH 30.1 03/19/2014 0405   MCHC 33.2 03/19/2014 0405   RDW 16.9* 03/19/2014 0405   LYMPHSABS 1.9 03/10/2014 1227   MONOABS 0.6 02/20/2014 1227   EOSABS 0.1 02/24/2014 1227   BASOSABS 0.1 02/16/2014 1227    CMP     Component Value Date/Time   NA 144 03/19/2014 0405   K 4.3 03/19/2014 0405   CL 105 03/19/2014 0405   CO2 27 03/19/2014 0405   GLUCOSE 125* 03/19/2014 0405   BUN 28* 03/19/2014 0405   CREATININE 0.79 03/19/2014 0405   CALCIUM 9.1 03/19/2014 0405   CALCIUM 8.8 02/20/2014 1032   PROT 4.9* 03/19/2014 0405   ALBUMIN 1.6* 03/19/2014 0405   AST 228* 03/19/2014 0405   ALT 81* 03/19/2014 0405   ALKPHOS 212* 03/19/2014 0405   BILITOT 0.4 03/19/2014 0405   GFRNONAA 82* 03/19/2014 0405   GFRAA >90 03/19/2014 0405      Assessment and plan: 68 yo female with newly diagnosed extensive stage SCLC. Palliative consulted for goals of care.   1. Code Status: Full  2. Goals of Care: See previous documentation.  Hopeful of response to chemo.  Will take ongoing discussion with family down road based on treatment response and  clinically trajectory.  They are hopeful she can get back home and continue treatments in [place] after this hospitalization. Family has started to at least prepare/discuss how to handle scenarios if she were not to do well.   3. Symptom Management:  1. Cancer Related Pain- Only used 10mg  of oxycodone and 3mg  of IV dilaudid in past 24h. No pain meds since this AM. Seems a bit better today. i would hold off on continuous medication at this point as she has not needed any PRN medication in ~6hrs. Can certainly consider if pain med administration continues to be frequent. She used about 50mg  of oxycodone or 60mg  of morphine in past 24h. I will change range on IV dilaudid to 0.5-1mg . Encourage oral oxycodone use first. Possible her pain may respond well to chemo as well.  2. Loss of appetite- On steroids chronically for RA. Reasonable that dronabinol was started. I will increase to 5mg  BID 3. H/O constipation- monitor with opioids.  4. Nausea- PRN zofran. None today  4. Psychosocial/Spiritual: lives in [place] area. Christian faith. Has 2 sons.   Doran Clay D.O. Palliative Medicine Team at [facility]  Pager: [phone] Team Phone: [phone]

## 2014-03-19 NOTE — Progress Notes (Signed)
Completed administration of Carboplatin and Etoposide chemotherapy agents at bedside. Administered via Right PICC - blood return verified before and after each administration.  Patient and family given education regarding medications and potential side effects.  Written information on Carboplatin and Etoposide given as well as Chemotherapy and you handbook. Obtained informed consent prior to administration.  Patient with low blood pressure noted during administration (likely due to IV Dilaudid administration before chemotherapy given).  Also experienced further decrease in oxygen saturation and was placed on 55% Ventimask with improved saturation.  Overall patient tolerated administration of chemotherapy well.  Bedside nurse given contact information for Oncology Charge Nurse if issues should arise in the night.

## 2014-03-19 NOTE — Progress Notes (Signed)
Debbie Howe is now in the ICU.  She has a PICC line in place.    I really have to thank everybody for the amazing effort they put in yesterday and ordered to get her treatment started last night. She started her chemotherapy last night. I felt that we had to get started emergently because of the decline in her platelet count. I felt that her bone marrow was quickly being "taking over" by her lung cancer. Her platelet count was falling quickly.  She had 600 mL of fluid removed from her right lung. She clearly has progression of her malignancy and right lung obstruction. I think that given that she has small cell lung cancer, even a minimal response will help open up her right lung.  She has a hemoglobin of 7.2 today. I will go ahead and plan for 2 units of blood.  We will get her back on a regular diet.  She is agitated this morning. This might be from steroids. Hopefully, with her eating, this will make her feel a little bit better.  She still having pain. She is on Dilaudid. We might want to try a Duragesic patch.  Her husband and son were in the room this morning. I updated them as to but was going on. I told them that she probably will still be in the hospital for another couple weeks. I think we have to probably keep her that long because of her pulmonary issue and also because of her extensive bone marrow involvement with her requiring transfusions frequently.  Her blood pressure is better this morning .she is still tachycardic. Her lungs are pretty good on the left side. Right side is decreased. Cardiac exam is tachycardic but regular. Abdomen is soft. Bowel sounds are slightly decreased. There is no obvious hepatomegaly. Extremities shows no clubbing, cyanosis or edema. Neurological exam is nonfocal.  Again, I really have to thank everybody for all the outstanding effort made to get her treated yesterday. I realize that this can be a very difficult situation but everybody really came together  nicely so that she could get treatment started emergently given her change in status in a matter of a day. Her family is very appreciative of all the efforts made.  We still have a long way to go.  We will transfuse her 2 units today. She will continue her chemotherapy.  Harriette Ohara 17:14

## 2014-03-19 NOTE — Progress Notes (Signed)
Progress Note   Debbie Howe HFG:[id number] DOB: Sep 23, 1945 DOA: 02/27/2014 PCP: Charletta Cousin, MD   Brief Narrative:   Debbie Howe is an 68 y.o. female with a PMH of rheumatoid arthritis, depression who was admitted to [facility] 02/16/2014 with shortness of breath. 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 Patient was transferred to [facility] 10/3/12015 for planned chemotherapy.    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  Further staging workup consistent with osseous and liver metastasis. No evidence of brain metastasis.  Evaluated by the palliative care team 03/17/14.  Dr. Marin Olp following for initiation of chemotherapy, which was started 03/18/14.monitor closely for tumor lysis syndrome.  Therapeutic/diagnostic thoracentesis planned.  Continue nebulized bronchodilators and oxygen when necessary to keep saturations greater than 90%.  Active Problems:   Sepsis secondary to community-acquired postobstructive pneumonia  Sepsis criteria met on admission with fever, tachycardia, leukocytosis and probable postobstructive pneumonia.  Treated with vancomycin and Zosyn. Vancomycin subsequently discontinued 03/17/14.  Follow-up pleural fluid cultures which will be done after thoracentesis.    Cancer related pain/abdominal pain/knee pain  Management per palliative care recommendations.    Nausea without  vomiting  Continue anti-emetics as needed.    Loss of appetite / severe protein calorie malnutrition  Dietitian consulted. Continue Marinol and nutritional supplements.    Depression  Continue Elavil.    Hypokalemia  Aggressively replaced.    Rheumatoid arthritis  On methotrexate and prednisone, we'll need to reconsider these medications if she becomes leukopenic/neutropenic.  Anemia of chronic disease/thrombocytopenia  Worrisome for bone marrow involvement. Monitor counts closely and transfuse as needed.  Status post 2 units of PRBCs 03/18/14.    Acute encephalopathy  Likely secondary to side effects of anxiolytics. Resolved.    DVT Prophylaxis  Continue SCDs.    Code Status: Full. Family Communication: [name] (significant other) updated at bedside. Disposition Plan: Home when stable.   IV Access:    Right upper extremity PICC line placed 03/18/14   Procedures and diagnostic studies:   Bronchoscopy 02/21/2014 - Cytology brushings from RML and RUL; EBBx from RUL bronchus and a nodule from the R mainstem; TBNA targeting pretracheal and subtracheal lymph nodes; All specimens sent for cytology and surg path.  Ct Abdomen Pelvis W Contrast 03/17/2014 1. Progressive enlargement of right pleural effusion with visible new atelectasis and collapse of the right lower lung. Correlation with chest x-ray a may be helpful as this may now represent a largely drowned right lung. 2. New small left pleural effusion. 3. 3 right hepatic lesions are identified, likely representing metastatic disease to the liver. The largest measures 1.5 cm. No other evidence of metastatic disease in the abdomen or pelvis.   Dg Chest Port 1 View 03/17/2014 Complete opacification of the right hemithorax due to a combination of right effusion and right upper lobe consolidation with right upper lobe mass as noted on recent CT. Suspect a combination of interstitial edema and redistribution of blood flow the  left lung. No airspace consolidation on  the left.   Order placed for therapeutic and diagnostic thoracentesis 03/18/2014. Pending.    Medical Consultants:    Oncology (Dr. Burney Gauze)  Interventional Radiology (Dr. Barbie Banner)  Palliative care (Dr. Timmie Foerster)  Anti-Infectives:    Zosyn 03/11/2014   Vancomycin 03/05/2014 --> 03/17/2014  Subjective:    Debbie Howe is weak.  Her appetite is poor.  No nausea or vomiting. Bowels moving.  Has has some back/abdominal pain.    Objective:    Filed Vitals:   03/19/14 0530 03/19/14 0600 03/19/14 0615 03/19/14 0630  BP: 124/76 119/57  136/79  Pulse: 116 116 116 117  Temp:      TempSrc:      Resp: _0 Height:      Weight:      SpO2: 98% 98% 96% 97%    Intake/Output Summary (Last 24 hours) at 03/19/14 0705 Last data filed at 03/19/14 0615  Gross per 24 hour  Intake 2350.42 ml  Output    865 ml  Net 1485.42 ml    Exam: Gen:  NAD Cardiovascular:  Tachycardic, No M/R/G Respiratory:  Lungs diminished Gastrointestinal:  Abdomen soft, NT/ND, + BS Extremities:  No C/E/C   Data Reviewed:    Labs: Basic Metabolic Panel:  Recent Labs Lab 03/15/14 0401 03/16/14 0710 03/18/14 0420 03/18/14 1500 03/19/14 0405  NA 145 147 143 142 144  K 3.8 3.5* 2.8* 4.4 4.3  CL 106 107 101 104 105  CO2 _1 GLUCOSE 101* 109* 92 141* 125*  BUN _2 28*  CREATININE 0.57 0.47* 0.47* 0.65 0.79  CALCIUM 9.6 9.7 9.6 8.5 9.1   GFR Estimated Creatinine Clearance: 56.2 mL/min (by C-G formula based on Cr of 0.79). Liver Function Tests:  Recent Labs Lab 02/15/2014 1227 03/13/14 0714 03/16/14 0710 03/18/14 0420 03/19/14 0405  AST 76* 83* 266* 103* 228*  ALT 18 16 56* 50* 81*  ALKPHOS 140* 142* 249* 248* 212*  BILITOT 0.4 0.4 0.6 0.5 0.4  PROT 7.2 6.3 5.9* 5.6* 4.9*  ALBUMIN 2.2* 1.7* 1.7* 1.7* 1.6*   Coagulation profile  Recent Labs Lab 03/18/14 0911  INR 1.13    CBC:  Recent Labs Lab  03/16/2014 1227  03/05/2014 0545 03/15/14 0401 03/16/14 0710 03/18/14 0420 03/19/14 0405  WBC 13.9*  < > 11.0* 10.4 8.0 7.4 5.7  NEUTROABS 11.2*  --   --   --   --   --   --   HGB 11.3*  < > 8.9* 9.0* 8.9* 11.3* 7.2*  HCT 35.1*  < > 28.4* 28.2* 27.8* 34.5* 21.7*  MCV 94.9  < > 93.1 93.4 93.0 89.1 90.8  PLT 164  < > 109* 111* 66* 41* 78*  < > = values in this interval not displayed.  BNP (last 3 results)  Recent Labs  03/11/2014 1515  PROBNP 769.4*   Sepsis Labs:  Recent Labs Lab 02/20/2014 1533  03/15/14 0401 03/16/14 0710 03/18/14 0420 03/19/14 0405  WBC  --   < > 10.4 8.0 7.4 5.7  LATICACIDVEN 1.73  --   --   --   --   --   < > = values in this interval not displayed. Microbiology Recent Results (from the past 240 hour(s))  Urine culture     Status: None   Collection Time: 02/24/2014  2:01 PM  Result Value Ref Range Status   Specimen Description URINE, CLEAN CATCH  Final   Special  Requests NONE  Final   Culture  Setup Time   Final    02/26/2014 21:10 Performed at [facility]   Final    >=100,000 COLONIES/ML Performed at [facility]   Culture   Final    Multiple bacterial morphotypes present, none predominant. Suggest appropriate recollection if clinically indicated. Performed at [facility]   Report Status 03/13/2014 FINAL  Final  Blood culture (routine x 2)     Status: None   Collection Time: 02/24/2014  2:20 PM  Result Value Ref Range Status   Specimen Description BLOOD RIGHT ANTECUBITAL  Final   Special Requests BOTTLES DRAWN AEROBIC AND ANAEROBIC 5MLS  Final   Culture  Setup Time   Final    03/04/2014 20:52 Performed at [facility]    Culture   Final    NO GROWTH 5 DAYS Performed at [facility]    Report Status 03/18/2014 FINAL  Final  Blood culture (routine x 2)     Status: None   Collection Time: 03/10/2014  3:15 PM  Result Value Ref Range Status   Specimen Description BLOOD RIGHT ANTECUBITAL   Final   Special Requests BOTTLES DRAWN AEROBIC AND ANAEROBIC 10MLS  Final   Culture  Setup Time   Final    02/14/2014 20:53 Performed at [facility]    Culture   Final    NO GROWTH 5 DAYS Performed at [facility]    Report Status 03/18/2014 FINAL  Final  MRSA PCR Screening     Status: Abnormal   Collection Time: 03/16/14  2:42 AM  Result Value Ref Range Status   MRSA by PCR POSITIVE (A) NEGATIVE Final    Comment:        The GeneXpert MRSA Assay (FDA approved for NASAL specimens only), is one component of a comprehensive MRSA colonization surveillance program. It is not intended to diagnose MRSA infection nor to guide or monitor treatment for MRSA infections. RESULT CALLED TO, READ BACK BY AND VERIFIED WITH: CALLED TO RN Donald Prose DOBSON [id number] _0  THANEY  Body fluid culture     Status: None (Preliminary result)   Collection Time: 03/18/14  3:45 PM  Result Value Ref Range Status   Specimen Description PLEURAL  Final   Special Requests NONE  Final   Gram Stain   Final    RARE WBC PRESENT,BOTH PMN AND MONONUCLEAR NO ORGANISMS SEEN Performed at [facility]    Culture PENDING  Incomplete   Report Status PENDING  Incomplete     Medications:   . allopurinol  100 mg Oral Daily  . amitriptyline  100 mg Oral QHS  . Chlorhexidine Gluconate Cloth  6 each Topical Q0600  . dronabinol  2.5 mg Oral BID AC  . feeding supplement (ENSURE COMPLETE)  237 mL Oral BID BM  . fluconazole (DIFLUCAN) IV  100 mg Intravenous Q24H  . folic acid  1 mg Oral Daily  . gabapentin  400 mg Oral Daily  . ipratropium-albuterol  3 mL Nebulization TID  . pantoprazole (PROTONIX) IV  40 mg Intravenous Q12H  . piperacillin-tazobactam (ZOSYN)  IV  3.375 g Intravenous Q8H  . polyethylene glycol  17 g Oral Daily  . predniSONE  10 mg Oral Q breakfast  . sodium chloride  3 mL Intravenous Q12H   Continuous Infusions: . sodium chloride 75 mL/hr at 03/18/14 2249    Time spent:  35 minutes.  The patient is critically  ill and requires high complexity decision making.   LOS: 7 days   [facility] Pager [phone]. If unable to reach me by pager, please call my cell phone at [phone].  *Please refer to [email or web address], password [email or web address] to get updated schedule on who will round on this patient, as hospitalists switch teams weekly. If 7PM-7AM, please contact night-coverage at [email or web address], password [email or web address] for any overnight needs.  03/19/2014, 7:05 AM

## 2014-03-20 DIAGNOSIS — M545 Low back pain, unspecified: Secondary | ICD-10-CM | POA: Diagnosis present

## 2014-03-20 LAB — TYPE AND SCREEN
ABO/RH(D): O POS
ANTIBODY SCREEN: NEGATIVE
UNIT DIVISION: 0
UNIT DIVISION: 0
Unit division: 0
Unit division: 0
Unit division: 0

## 2014-03-20 LAB — CBC
HEMATOCRIT: 29.7 % — AB (ref 36.0–46.0)
Hemoglobin: 10 g/dL — ABNORMAL LOW (ref 12.0–15.0)
MCH: 30.1 pg (ref 26.0–34.0)
MCHC: 33.7 g/dL (ref 30.0–36.0)
MCV: 89.5 fL (ref 78.0–100.0)
Platelets: 43 10*3/uL — ABNORMAL LOW (ref 150–400)
RBC: 3.32 MIL/uL — ABNORMAL LOW (ref 3.87–5.11)
RDW: 15.4 % (ref 11.5–15.5)
WBC: 5 10*3/uL (ref 4.0–10.5)

## 2014-03-20 LAB — URIC ACID: Uric Acid, Serum: 4.8 mg/dL (ref 2.4–7.0)

## 2014-03-20 LAB — COMPREHENSIVE METABOLIC PANEL
ALT: 75 U/L — ABNORMAL HIGH (ref 0–35)
ANION GAP: 15 (ref 5–15)
AST: 247 U/L — ABNORMAL HIGH (ref 0–37)
Albumin: 1.8 g/dL — ABNORMAL LOW (ref 3.5–5.2)
Alkaline Phosphatase: 215 U/L — ABNORMAL HIGH (ref 39–117)
BUN: 40 mg/dL — AB (ref 6–23)
CO2: 25 mEq/L (ref 19–32)
CREATININE: 1.12 mg/dL — AB (ref 0.50–1.10)
Calcium: 8.6 mg/dL (ref 8.4–10.5)
Chloride: 105 mEq/L (ref 96–112)
GFR, EST AFRICAN AMERICAN: 56 mL/min — AB (ref >90)
GFR, EST NON AFRICAN AMERICAN: 49 mL/min — AB (ref >90)
GLUCOSE: 116 mg/dL — AB (ref 70–99)
Potassium: 3.7 mEq/L (ref 3.7–5.3)
SODIUM: 145 meq/L (ref 137–147)
TOTAL PROTEIN: 5.2 g/dL — AB (ref 6.0–8.3)
Total Bilirubin: 0.4 mg/dL (ref 0.3–1.2)

## 2014-03-20 MED ORDER — SODIUM CHLORIDE 0.9 % IJ SOLN: 10.0000 mL | INTRAMUSCULAR | Status: DC | PRN

## 2014-03-20 MED ORDER — ENSURE COMPLETE PO LIQD: 237.0000 mL | Freq: Two times a day (BID) | ORAL | Status: DC | PRN

## 2014-03-20 MED ORDER — ALTEPLASE 2 MG IJ SOLR: 2.0000 mg | Freq: Once | INTRAMUSCULAR | Status: AC | PRN

## 2014-03-20 MED ORDER — PROCHLORPERAZINE MALEATE 10 MG PO TABS
10.0000 mg | ORAL_TABLET | Freq: Once | ORAL | Status: AC
  Administered 2014-03-20: 10 mg via ORAL
  Filled 2014-03-20: qty 1

## 2014-03-20 MED ORDER — MUPIROCIN 2 % EX OINT
TOPICAL_OINTMENT | Freq: Two times a day (BID) | CUTANEOUS | Status: DC
  Administered 2014-03-20 – 2014-03-22 (×6): via NASAL
  Filled 2014-03-20: qty 22

## 2014-03-20 MED ORDER — SODIUM CHLORIDE 0.9 % IV SOLN: Freq: Once | INTRAVENOUS | Status: DC

## 2014-03-20 MED ORDER — HOT PACK MISC ONCOLOGY
1.0000 | Freq: Once | Status: AC | PRN
  Filled 2014-03-20: qty 1

## 2014-03-20 MED ORDER — FUROSEMIDE 10 MG/ML IJ SOLN
60.0000 mg | Freq: Once | INTRAMUSCULAR | Status: AC
  Administered 2014-03-20: 60 mg via INTRAVENOUS
  Filled 2014-03-20: qty 6

## 2014-03-20 MED ORDER — HEPARIN SOD (PORK) LOCK FLUSH 100 UNIT/ML IV SOLN: 250.0000 [IU] | Freq: Once | INTRAVENOUS | Status: AC | PRN

## 2014-03-20 MED ORDER — SODIUM CHLORIDE 0.9 % IJ SOLN: 3.0000 mL | INTRAMUSCULAR | Status: DC | PRN

## 2014-03-20 MED ORDER — HEPARIN SOD (PORK) LOCK FLUSH 100 UNIT/ML IV SOLN
500.0000 [IU] | Freq: Once | INTRAVENOUS | Status: AC | PRN
  Filled 2014-03-20: qty 5

## 2014-03-20 MED ORDER — SODIUM CHLORIDE 0.9 % IV SOLN
80.0000 mg/m2 | Freq: Once | INTRAVENOUS | Status: AC
  Administered 2014-03-20: 130 mg via INTRAVENOUS
  Filled 2014-03-20: qty 6.5

## 2014-03-20 MED ORDER — RESOURCE INSTANT PROTEIN PO PWD PACKET
1.0000 | Freq: Two times a day (BID) | ORAL | Status: DC
  Administered 2014-03-21 – 2014-03-28 (×5): 6 g via ORAL
  Filled 2014-03-20 (×18): qty 6

## 2014-03-20 MED ORDER — FUROSEMIDE 10 MG/ML IJ SOLN
60.0000 mg | Freq: Two times a day (BID) | INTRAMUSCULAR | Status: DC
Start: 2014-03-20 — End: 2014-03-21
  Administered 2014-03-20: 60 mg via INTRAVENOUS
  Filled 2014-03-20: qty 6

## 2014-03-20 NOTE — Progress Notes (Signed)
Patient FF:[id number] Brinker      DOB: 31-Mar-1946      DJT:[id number]   Palliative Medicine Team at [facility] Progress Note    Subjective: Lethargic. On venti mask but drinking and eating has improved. Currently receiving IV chemotherapy. Pain is controlled. She appears to have moderate to severe SOB.  Filed Vitals:   03/20/14 1200  BP:   Pulse:   Temp: 97.6 F (36.4 C)  Resp:    Physical exam: Gen: Alert, mild dyspnea HEENT: [place], sclera anciteric CV: Tachy, regular ABD: soft, ND EXT: no edema Skin: warm/dry  CBC    Component Value Date/Time   WBC 5.0 03/20/2014 0430   RBC 3.32* 03/20/2014 0430   RBC 3.27* 03/15/2014 1032   HGB 10.0* 03/20/2014 0430   HCT 29.7* 03/20/2014 0430   PLT 43* 03/20/2014 0430   MCV 89.5 03/20/2014 0430   MCH 30.1 03/20/2014 0430   MCHC 33.7 03/20/2014 0430   RDW 15.4 03/20/2014 0430   LYMPHSABS 1.9 02/22/2014 1227   MONOABS 0.6 03/07/2014 1227   EOSABS 0.1 03/15/2014 1227   BASOSABS 0.1 02/26/2014 1227    CMP     Component Value Date/Time   NA 145 03/20/2014 0430   K 3.7 03/20/2014 0430   CL 105 03/20/2014 0430   CO2 25 03/20/2014 0430   GLUCOSE 116* 03/20/2014 0430   BUN 40* 03/20/2014 0430   CREATININE 1.12* 03/20/2014 0430   CALCIUM 8.6 03/20/2014 0430   CALCIUM 8.8 02/22/2014 1032   PROT 5.2* 03/20/2014 0430   ALBUMIN 1.8* 03/20/2014 0430   AST 247* 03/20/2014 0430   ALT 75* 03/20/2014 0430   ALKPHOS 215* 03/20/2014 0430   BILITOT 0.4 03/20/2014 0430   GFRNONAA 49* 03/20/2014 0430   GFRAA 56* 03/20/2014 0430      Assessment and plan: 68 yo female with newly diagnosed extensive stage SCLC. Palliative consulted for goals of care.Dr. Marin Olp primary oncologist guiding care plan. Dr. Rockne Menghini attending for medical management and hospital care.  1. Code Status: Full (Looking at her this afternoon this needs to be addressed fully- I will arrange to meet with family again to make sure we get a sense of what matters to this  patient in terms of her QOL and explore the hypothetical (all though high risk) for decompensation.    2. Goals of Care: See prior documentation. Patient and family are expecting a significant recovery.  3. Symptom Management:  1. Cancer Related Pain- Continue PRN pain medication-for now controlled even though she is sedated. 2. Loss of appetite- MUCH BETTER. On steroids chronically for RA. Reasonable that dronabinol was started. Increased to 5mg  BID yesterday. 3. H/O constipation- monitor with opioids.  4. Nausea- PRN zofran. None today 5. Dyspnea- moderately severe even after thoracentesis--she is up 11L - agree with more aggressive diuresis- will check a CXR in Am and make sure UOP responds to dosing.  4. Psychosocial/Spiritual: lives in [place] area. Christian faith. Has 2 sons.   Lane Hacker, DO Palliative Medicine [phone]

## 2014-03-20 NOTE — Progress Notes (Signed)
Etoposide given via right PICC - blood return verified prior to and after administration.  Patient tolerated will without complications.  Please call oncology charge nurse at [phone] for any questions or concerns.  Chemo precautions should continue until 11/6 at 125PM.

## 2014-03-20 NOTE — Progress Notes (Signed)
Progress Note   Debbie Howe QMG:[id number] DOB: Aug 31, 1945 DOA: 03/10/2014 PCP: Charletta Cousin, MD   Brief Narrative:   Debbie Howe is an 68 y.o. female with a PMH of rheumatoid arthritis, depression who was admitted to [facility] 03/16/2014 with shortness of breath. 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 Patient was transferred to [facility] 10/3/12015 for planned chemotherapy.    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  Further staging workup consistent with osseous and liver metastasis. No evidence of brain metastasis.  Evaluated by the palliative care team 03/17/14.  Dr. Marin Olp following for initiation of chemotherapy, which was started 03/18/14. Monitor closely for tumor lysis syndrome. Potassium stable at 3.7 today.  Therapeutic/diagnostic thoracentesis done 03/18/14, 600 cc pleural fluid removed.  Continue nebulized bronchodilators and supplemental oxygen to keep saturations greater than 90%.  Active Problems:   Acute kidney injury  Creatinine rising.  Monitor closely.    Sepsis secondary to community-acquired postobstructive pneumonia  Sepsis criteria met on admission with fever, tachycardia, leukocytosis and probable postobstructive pneumonia.  Treated with vancomycin and Zosyn. Vancomycin subsequently discontinued 03/17/14.  Follow-up pleural fluid cultures.    Cancer related pain/abdominal  pain/knee pain  Management per palliative care recommendations.    Nausea without vomiting  Continue anti-emetics as needed.    Loss of appetite / severe protein calorie malnutrition  Dietitian consulted. Continue Marinol and nutritional supplements.    Depression  Continue Elavil.    Hypokalemia  Aggressively replaced.    Rheumatoid arthritis  On methotrexate and prednisone, we'll need to reconsider these medications if she becomes leukopenic/neutropenic.  Anemia of chronic disease/thrombocytopenia  Depressed hemoglobin and platelet count worrisome for bone marrow involvement. Monitor counts closely and transfuse as needed.  Status post 2 units of PRBCs 03/17/14 and an additional 2 units 03/19/14.    Acute encephalopathy  Likely secondary to side effects of anxiolytics. Resolved.    DVT Prophylaxis  Continue SCDs.    Code Status: Full. Family Communication: [name] (significant other) updated at bedside. Disposition Plan: Home when stable.   IV Access:    Right upper extremity PICC line placed 03/18/14   Procedures and diagnostic studies:   Bronchoscopy 02/25/2014 - Cytology brushings from RML and RUL; EBBx from RUL bronchus and a nodule from the R mainstem; TBNA targeting pretracheal and subtracheal lymph nodes; All specimens sent for cytology and surg path.  Ct Abdomen Pelvis W Contrast 03/17/2014 1. Progressive enlargement of right pleural effusion with visible new atelectasis and collapse of the right lower lung. Correlation with chest x-ray a may be helpful as this may now represent a largely drowned right lung. 2. New small left pleural effusion. 3. 3 right hepatic lesions are identified, likely representing metastatic disease to the liver. The largest measures 1.5 cm. No other evidence of metastatic disease in the abdomen or pelvis.   Dg Chest Port 1 View 03/17/2014 Complete opacification of the right hemithorax due to a combination of right effusion and  right upper lobe consolidation  with right upper lobe mass as noted on recent CT. Suspect a combination of interstitial edema and redistribution of blood flow the left lung. No airspace consolidation on the left.   Ir Thoracentesis Asp Pleural Space W/img Guide 03/18/2014: Successful ultrasound guided right thoracentesis yielding 600 cc of pleural fluid.   Electronically Signed   By: Maryclare Bean M.D.   On: 03/18/2014 16:21      Medical Consultants:    Oncology (Dr. Burney Gauze)  Interventional Radiology (Dr. Barbie Banner)  Palliative care (Dr. Timmie Foerster)  Anti-Infectives:    Zosyn 02/20/2014   Vancomycin 03/11/2014 --> 03/17/2014  Subjective:   Staci Righter remains weak.  Her appetite is poor.  No nausea or vomiting. Still gets dyspneic and still needs high flow oxygen.    Objective:    Filed Vitals:   03/20/14 0100 03/20/14 0300 03/20/14 0400 03/20/14 0500  BP: 172/80 151/97  166/68  Pulse: 106 110  116  Temp:   98.2 F (36.8 C)   TempSrc:   Axillary   Resp: 10 15  14   Height:      Weight:   66 kg (145 lb 8.1 oz)   SpO2: 96% 96%  97%    Intake/Output Summary (Last 24 hours) at 03/20/14 0731 Last data filed at 03/20/14 0654  Gross per 24 hour  Intake 1211.25 ml  Output    950 ml  Net 261.25 ml    Exam: Gen:  NAD Cardiovascular:  Tachycardic, No M/R/G Respiratory:  Lungs diminished Gastrointestinal:  Abdomen soft, NT/ND, + BS Extremities:  No C/E/C   Data Reviewed:    Labs: Basic Metabolic Panel:  Recent Labs Lab 03/16/14 0710 03/18/14 0420 03/18/14 1500 03/19/14 0405 03/20/14 0430  NA 147 143 142 144 145  K 3.5* 2.8* 4.4 4.3 3.7  CL 107 101 104 105 105  CO2 26 29 25 27 25   GLUCOSE 109* 92 141* 125* 116*  BUN 14 14 14  28* 40*  CREATININE 0.47* 0.47* 0.65 0.79 1.12*  CALCIUM 9.7 9.6 8.5 9.1 8.6   GFR Estimated Creatinine Clearance: 41.7 mL/min (by C-G formula based on Cr of 1.12). Liver Function Tests:  Recent Labs Lab 03/16/14 0710  03/18/14 0420 03/19/14 0405 03/20/14 0430  AST 266* 103* 228* 247*  ALT 56* 50* 81* 75*  ALKPHOS 249* 248* 212* 215*  BILITOT 0.6 0.5 0.4 0.4  PROT 5.9* 5.6* 4.9* 5.2*  ALBUMIN 1.7* 1.7* 1.6* 1.8*   Coagulation profile  Recent Labs Lab 03/18/14 0911  INR 1.13    CBC:  Recent Labs Lab 03/15/14 0401 03/16/14 0710 03/18/14 0420 03/19/14 0405 03/20/14 0430  WBC 10.4 8.0 7.4 5.7 5.0  HGB 9.0* 8.9* 11.3* 7.2* 10.0*  HCT 28.2* 27.8* 34.5* 21.7* 29.7*  MCV 93.4 93.0 89.1 90.8 89.5  PLT 111* 66* 41* 78* 43*    BNP (last 3 results)  Recent Labs  03/13/2014 1515  PROBNP 769.4*   Sepsis Labs:  Recent Labs Lab 03/16/14 0710 03/18/14 0420 03/19/14 0405 03/20/14 0430  WBC 8.0 7.4 5.7 5.0   Microbiology Recent Results (from the past 240 hour(s))  Urine culture     Status: None   Collection Time: 03/15/2014  2:01 PM  Result Value Ref Range Status   Specimen Description URINE, CLEAN CATCH  Final   Special Requests NONE  Final   Culture  Setup Time   Final    03/10/2014 21:10 Performed at [place]   Final    >=  100,000 COLONIES/ML Performed at [facility]   Final    Multiple bacterial morphotypes present, none predominant. Suggest appropriate recollection if clinically indicated. Performed at [facility]   Report Status 03/13/2014 FINAL  Final  Blood culture (routine x 2)     Status: None   Collection Time: 02/26/2014  2:20 PM  Result Value Ref Range Status   Specimen Description BLOOD RIGHT ANTECUBITAL  Final   Special Requests BOTTLES DRAWN AEROBIC AND ANAEROBIC 5MLS  Final   Culture  Setup Time   Final    02/20/2014 20:52 Performed at [facility]    Culture   Final    NO GROWTH 5 DAYS Performed at [facility]    Report Status 03/18/2014 FINAL  Final  Blood culture (routine x 2)     Status: None   Collection Time: 02/25/2014  3:15 PM  Result Value Ref Range Status   Specimen  Description BLOOD RIGHT ANTECUBITAL  Final   Special Requests BOTTLES DRAWN AEROBIC AND ANAEROBIC 10MLS  Final   Culture  Setup Time   Final    03/04/2014 20:53 Performed at [facility]    Culture   Final    NO GROWTH 5 DAYS Performed at [facility]    Report Status 03/18/2014 FINAL  Final  MRSA PCR Screening     Status: Abnormal   Collection Time: 03/16/14  2:42 AM  Result Value Ref Range Status   MRSA by PCR POSITIVE (A) NEGATIVE Final    Comment:        The GeneXpert MRSA Assay (FDA approved for NASAL specimens only), is one component of a comprehensive MRSA colonization surveillance program. It is not intended to diagnose MRSA infection nor to guide or monitor treatment for MRSA infections. RESULT CALLED TO, READ BACK BY AND VERIFIED WITH: CALLED TO RN LETICIA DOBSON [id number] [phone]  THANEY  Body fluid culture     Status: None (Preliminary result)   Collection Time: 03/18/14  3:45 PM  Result Value Ref Range Status   Specimen Description PLEURAL  Final   Special Requests NONE  Final   Gram Stain   Final    RARE WBC PRESENT,BOTH PMN AND MONONUCLEAR NO ORGANISMS SEEN Performed at [facility]    Culture   Final    NO GROWTH 1 DAY Performed at [facility]    Report Status PENDING  Incomplete     Medications:   . sodium chloride   Intravenous Once  . allopurinol  100 mg Oral Daily  . amitriptyline  100 mg Oral QHS  . Chlorhexidine Gluconate Cloth  6 each Topical Q0600  . dronabinol  5 mg Oral BID AC  . feeding supplement (ENSURE COMPLETE)  237 mL Oral BID BM  . fluconazole (DIFLUCAN) IV  100 mg Intravenous Q24H  . folic acid  1 mg Oral Daily  . gabapentin  400 mg Oral Daily  . ipratropium-albuterol  3 mL Nebulization TID  . pantoprazole (PROTONIX) IV  40 mg Intravenous Q12H  . piperacillin-tazobactam (ZOSYN)  IV  3.375 g Intravenous Q8H  . polyethylene glycol  17 g Oral Daily  . predniSONE  10 mg Oral Q breakfast  . sodium  chloride  3 mL Intravenous Q12H   Continuous Infusions: . sodium chloride 25 mL/hr at 03/20/14 0654    Time spent: 35 minutes.  The patient is critically ill and requires high complexity decision making.   LOS: 8 days  [facility] Pager [phone]. If unable to reach me by pager, please call my cell phone at [phone].  *Please refer to [email or web address], password [email or web address] to get updated schedule on who will round on this patient, as hospitalists switch teams weekly. If 7PM-7AM, please contact night-coverage at [email or web address], password [email or web address] for any overnight needs.  03/20/2014, 7:31 AM

## 2014-03-20 NOTE — Progress Notes (Signed)
Etoposide chemotherapy administered via right PICC line per protocol.  Blood return verified before and after medication administration.  Patient tolerated well without any complications.  Please call Oncology charge nurse [phone] for any questions or concerns.  Thanks!

## 2014-03-20 NOTE — Progress Notes (Signed)
Debbie Howe looks a little better today. She definitely is not as agitated. She still has the facemask oxygen on.  Today will be the third day of her chemotherapy. After today, we will start her on Neupogen. Her next cycle chemotherapy will not start for 3 weeks.  Her blood pressure is doing a lot better.  We did give her 2 units of blood yesterday. Her hemoglobin today is 10.0.  She still has wheezing. I think this will be a problem until her right lung will open up. I think we should be able to see some improvement by early next week.  She's not eating much. This, is understandable.  Her renal function is doing okay. Her potassium is doing okay.  I need to check her uric acid.  She still tachycardic. Again this is not surprising given her thoracic disease.  Her liver tests are somewhat elevated. These will have to be watched.  On her vital signs, temperature is 98.2. Pulse is 116. Blood pressure is 166/68.her lungs show wheezes and crackles bilaterally.she has decreased breath sounds over on the right side. Cardiac exam tachycardic but regular. Abdominal exam soft. Bowel sounds are slightly decreased. There is no palpable liver or spleen. Extremities shows some 1+ edema in her lower legs. Neurological exam is nonfocal.  Her input since admission is up 11 L. With her blood pressure being good, we probably can give her some Lasix to try to bring some water out of her.  I still think that we're looking at another 10-14 days of her being in the hospital. We have to let this cycle of   chemotherapy work to allow her right lung to open up. I think once that happens, then she will improve nicely.  We had an excellent prayer sessions morning. Her faith remains strong. Her husband was with this.  I appreciate the great care that she is getting from all the staff down the ICU.  Pete E.  Hebrews 12:12

## 2014-03-20 NOTE — Progress Notes (Signed)
NUTRITION FOLLOW UP  Intervention:   -Modify Ensure to PRN; pt not consuming supplement -Recommend Beneprotein protein supplement BID, each supplement provides 25 kcal, 6 gram protein -Recommend MagicCup BID w/meals, each supplement provides 290 kcal, 9 gram protein -Recommend 8PM nourishment (cookies and milk) -Encouraged pt to continue to bring in foods pt enjoys (milkshakes, strawberries,etc) -RD to continue to monitor  Nutrition Dx:   Inadequate oral intake related to poor appetite, illness for the last 2 months as evidenced by Meal Completion: <25%; ongoing   Goal:   Pt to meet >/= 90% of their estimated nutrition needs; no met  Monitor:   Supplement tolerance, total protein/energy intake, labs, weights  Assessment:   10/28: Pt with hx of RA, admitted with SOB, pt has extensive 50 year smoking hx. Per CT pt has a large right hilar mass which is concerning for lung ca. Pulmonology consulted for biopsy.  Pt with a lot of family in the room. Pt lives with her husband and son. Per family pt has been eating very poorly for the last 1 1/2 - 2 months. 24 hr recall: Breakfast: cereal or an egg, Lunch: nothing, Dinner: bites. Pt has had a poor appetite  Pt has not tried any PO supplements before but they feel she would like chocolate ensure.  Family very concerned about pt. Pt very sleepy but states she has probably lost 25 lb. Could not confirm this as she also states she usually weighs 138   11/04: -Pt continues with poor PO intake,, 0-25% -Has not been drinking Ensure, will modify to PRN -Family has been bringing in outside food sources to encourage PO intake - chocolate milkshake, strawberries, etc -Pt reported she enjoys most foods; however has early satiety. Recommend pt trial Beneprotein protein supplement to increase nutrient density of foods pt is able to consume -Was willing to trial MagicCup as pt enjoys sweets, as well as 8PM snack of chocolate chip cookies and whole  milk -Received first chemo on 11/02; pt noted to have constipation. Was given stool softener; which will likely assist in early satiety -Pt to start on Marinol BID on 11/04   Height: Ht Readings from Last 1 Encounters:  02/24/2014 5' 2"  (1.575 m)    Weight Status:   Wt Readings from Last 1 Encounters:  03/20/14 145 lb 8.1 oz (66 kg)    Re-estimated needs:  Kcal: 1600-1800 Protein: 75-90 grams Fluid: >1.6 L/day  Skin: ecchymosis  Diet Order: Diet regular   Intake/Output Summary (Last 24 hours) at 03/20/14 1153 Last data filed at 03/20/14 1145  Gross per 24 hour  Intake 1026.25 ml  Output   1600 ml  Net -573.75 ml    Last BM: 11/01- constipation   Labs:   Recent Labs Lab 03/18/14 1500 03/19/14 0405 03/20/14 0430  NA 142 144 145  K 4.4 4.3 3.7  CL 104 105 105  CO2 25 27 25   BUN 14 28* 40*  CREATININE 0.65 0.79 1.12*  CALCIUM 8.5 9.1 8.6  GLUCOSE 141* 125* 116*    CBG (last 3)  No results for input(s): GLUCAP in the last 72 hours.  Scheduled Meds: . sodium chloride   Intravenous Once  . sodium chloride   Intravenous Once  . allopurinol  100 mg Oral Daily  . amitriptyline  100 mg Oral QHS  . Chlorhexidine Gluconate Cloth  6 each Topical Q0600  . dronabinol  5 mg Oral BID AC  . etoposide  80 mg/m2 (Treatment Plan Actual) Intravenous  Once  . feeding supplement (ENSURE COMPLETE)  237 mL Oral BID BM  . fluconazole (DIFLUCAN) IV  100 mg Intravenous Q24H  . folic acid  1 mg Oral Daily  . gabapentin  400 mg Oral Daily  . ipratropium-albuterol  3 mL Nebulization TID  . mupirocin ointment   Nasal BID  . pantoprazole (PROTONIX) IV  40 mg Intravenous Q12H  . piperacillin-tazobactam (ZOSYN)  IV  3.375 g Intravenous Q8H  . polyethylene glycol  17 g Oral Daily  . predniSONE  10 mg Oral Q breakfast  . protein supplement  1 scoop Oral BID WC  . sodium chloride  3 mL Intravenous Q12H    Continuous Infusions: . sodium chloride 25 mL/hr at 03/20/14 [place] LDN Clinical Dietitian [id number]

## 2014-03-21 ENCOUNTER — Inpatient Hospital Stay (HOSPITAL_COMMUNITY): Payer: Medicare Other

## 2014-03-21 DIAGNOSIS — G9340 Encephalopathy, unspecified: Secondary | ICD-10-CM

## 2014-03-21 LAB — URIC ACID: URIC ACID, SERUM: 6 mg/dL (ref 2.4–7.0)

## 2014-03-21 LAB — CBC
HCT: 28 % — ABNORMAL LOW (ref 36.0–46.0)
Hemoglobin: 9.5 g/dL — ABNORMAL LOW (ref 12.0–15.0)
MCH: 30.4 pg (ref 26.0–34.0)
MCHC: 33.9 g/dL (ref 30.0–36.0)
MCV: 89.7 fL (ref 78.0–100.0)
Platelets: 30 10*3/uL — ABNORMAL LOW (ref 150–400)
RBC: 3.12 MIL/uL — ABNORMAL LOW (ref 3.87–5.11)
RDW: 15.7 % — ABNORMAL HIGH (ref 11.5–15.5)
WBC: 2 10*3/uL — ABNORMAL LOW (ref 4.0–10.5)

## 2014-03-21 LAB — COMPREHENSIVE METABOLIC PANEL
ALT: 62 U/L — ABNORMAL HIGH (ref 0–35)
AST: 135 U/L — ABNORMAL HIGH (ref 0–37)
Albumin: 1.9 g/dL — ABNORMAL LOW (ref 3.5–5.2)
Alkaline Phosphatase: 181 U/L — ABNORMAL HIGH (ref 39–117)
Anion gap: 14 (ref 5–15)
BUN: 49 mg/dL — ABNORMAL HIGH (ref 6–23)
CALCIUM: 9.4 mg/dL (ref 8.4–10.5)
CO2: 28 mEq/L (ref 19–32)
Chloride: 103 mEq/L (ref 96–112)
Creatinine, Ser: 1.58 mg/dL — ABNORMAL HIGH (ref 0.50–1.10)
GFR calc non Af Amer: 32 mL/min — ABNORMAL LOW (ref >90)
GFR, EST AFRICAN AMERICAN: 37 mL/min — AB (ref >90)
GLUCOSE: 119 mg/dL — AB (ref 70–99)
Potassium: 3.5 mEq/L — ABNORMAL LOW (ref 3.7–5.3)
SODIUM: 145 meq/L (ref 137–147)
TOTAL PROTEIN: 5.4 g/dL — AB (ref 6.0–8.3)
Total Bilirubin: 0.4 mg/dL (ref 0.3–1.2)

## 2014-03-21 LAB — MAGNESIUM: MAGNESIUM: 2.1 mg/dL (ref 1.5–2.5)

## 2014-03-21 LAB — PHOSPHORUS: PHOSPHORUS: 5.4 mg/dL — AB (ref 2.3–4.6)

## 2014-03-21 MED ORDER — FILGRASTIM 480 MCG/1.6ML IJ SOLN
480.0000 ug | Freq: Every day | INTRAMUSCULAR | Status: DC
  Administered 2014-03-21 – 2014-03-27 (×7): 480 ug via SUBCUTANEOUS
  Filled 2014-03-21 (×9): qty 1.6

## 2014-03-21 MED ORDER — POTASSIUM CHLORIDE 10 MEQ/50ML IV SOLN
10.0000 meq | INTRAVENOUS | Status: AC
  Administered 2014-03-21 (×2): 10 meq via INTRAVENOUS
  Filled 2014-03-21: qty 50

## 2014-03-21 MED ORDER — ONDANSETRON 8 MG/NS 50 ML IVPB
8.0000 mg | Freq: Three times a day (TID) | INTRAVENOUS | Status: AC
  Administered 2014-03-21 – 2014-03-25 (×12): 8 mg via INTRAVENOUS
  Filled 2014-03-21 (×14): qty 8

## 2014-03-21 NOTE — Plan of Care (Signed)
Problem: Phase I Progression Outcomes Goal: Pain controlled with appropriate interventions Outcome: Progressing  Comments:  Patient required one dose of PRN PO pain meds from 7am to 6pm

## 2014-03-21 NOTE — Progress Notes (Signed)
ANTIBIOTIC CONSULT NOTE - FOLLOW UP  Pharmacy Consult for Zosyn Indication: postobstructive pneumonia  Allergies  Allergen Reactions  . Ativan [Lorazepam] Other (See Comments)    Altered mental status after ativan 1 mg iv    Patient Measurements: Height: 5\' 2"  (157.5 cm) Weight: 145 lb 8.1 oz (66 kg) IBW/kg (Calculated) : 50.1  Vital Signs: Temp: 98.9 F (37.2 C) (11/05 0800) Temp Source: Axillary (11/05 0800) BP: 118/66 mmHg (11/05 0700) Pulse Rate: 108 (11/05 0700) Intake/Output from previous day: 11/04 0701 - 11/05 0700 In: 703 [I.V.:603; IV Piggyback:100] Out: 1775 [Urine:1775] Intake/Output from this shift:    Labs:  Recent Labs  03/19/14 0405 03/20/14 0430 03/21/14 0452  WBC 5.7 5.0 2.0*  HGB 7.2* 10.0* 9.5*  PLT 78* 43* 30*  CREATININE 0.79 1.12* 1.58*   Estimated Creatinine Clearance: 29.6 mL/min (by C-G formula based on Cr of 1.58). No results for input(s): VANCOTROUGH, VANCOPEAK, VANCORANDOM, GENTTROUGH, GENTPEAK, GENTRANDOM, TOBRATROUGH, TOBRAPEAK, TOBRARND, AMIKACINPEAK, AMIKACINTROU, AMIKACIN in the last 72 hours.     Assessment: 61 yoF with newly diagnosed metastatic SCLC. Pharmacy consulted to dose vanc/zosyn for postobstructive pneumonia. status post chemo 11/2 - 11/4. PAC placement and thoracentesis scheduled for 11/2.  10/27 azith/ctx x 1 10/27 >> Zosyn >> 10/27 >> Vanc >> 10/31 11/2 >> fluconazole (MD x 7d) >>  Tmax: AF WBC: decreasing - now low.  Filgrastim started today Renal: SCr trending up, CrCl~3ml/min  10/31 MRSA screen (+) 10/27 urine cx: >100K cfu/ml multiple species 10/27 blood x 2: ngtd 11/2 R pleural fluid: NGTD  Goal of Therapy:  Doses adjusted per renal function Eradication of infection  Plan:  Day #9 zosyn 1.  Continue Zosyn 3.375g IV q8h (4 hour infusion time). Watch renal function and adjust dose if CrCl falls < 89ml/min 2.  F/u planned duration of treatment for post-obstructive pneumonia  Doreene Eland,  PharmD, BCPS.   Pager: [phone]  03/21/2014,10:20 AM

## 2014-03-21 NOTE — Progress Notes (Signed)
Debbie Howe actually looked a little better this morning. She is seeing pretty comfortable. She may have been a little bit lethargic. She was not dyspneic. She still had the facemask oxygen on.  Her lungs axis sounded pretty code. I really do not hear wheezing. I actually her some breath sounds over on the right side. Her chest x-ray, however, showed complete opacification of the right lung.  She was given diuresis yesterday. She put out a decent amount of urine. She has a Foley catheter in place.  I will start her on Neupogen today. She completed chemotherapy yesterday.  Her renal function is showing an increase in BUN and creatinine., Sure what might be causing this. Her uric acid is 6.0. It might be the allopurinol. She is on antibiotics.  Am not sure how much she is eating.   On her physical exam, she is afebrile. Her blood pressure has been doing better. It is 142/71. Her heart rate is 112. Her lungs show good breath sounds in the left lung field. Right lung field is slightly decreased but was decent air movement for what I can tell. Cardiac exam is tachycardic. Abdomen is soft. There is no distention. She has no fluid wave. There is no palpable liver or spleen tip. Extremities shows no clubbing, cyanosis or edema. Neurological exam shows some slight lethargy.  She has extensive stage small cell lung cancer.she has bone marrow involvement. She had progressive thrombocytopenia. Today, her platelet count is 30,000. We will have to watch this closely. Her white cell count is 2.  I am more Ward about her renal function. Her renal function has continued to worsen. I suppose that she is at risk for tumor lysis but I do not see this at this point.  She still has a very long way to go. I still would not think that she would be ready for discharge for another couple of weeks.

## 2014-03-21 NOTE — Progress Notes (Signed)
   Progress Note   Debbie Howe MRN:[id number] DOB: 01/22/1946 DOA: 03/04/2014 PCP: THOMAS,BRAD, MD   Brief Narrative:   Debbie Howe is an 68 y.o. female with a PMH of rheumatoid arthritis, depression who was admitted to [facility] 03/03/2014 with shortness of breath. 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 Patient was transferred to [facility] 10/3/12015 for planned chemotherapy.    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  Further staging workup consistent with osseous and liver metastasis. No evidence of brain metastasis.  Evaluated by the palliative care team 03/17/14 who continues to follow her course.  Dr. Ennever following for initiation of chemotherapy, which was started 03/18/14. Monitor closely for tumor lysis syndrome. Potassium stable at 3.5 today.  Phosphorus mildly elevated at 5.4, magnesium and uric acid WNL.  Therapeutic/diagnostic thoracentesis done 03/18/14, 600 cc pleural fluid removed.  Continue nebulized bronchodilators and supplemental oxygen to keep saturations greater than 90%.  Active Problems:   Acute kidney injury  Creatinine rising.  Increase IVF.    Sepsis secondary to community-acquired postobstructive pneumonia  Sepsis criteria met on admission with fever, tachycardia, leukocytosis and probable postobstructive pneumonia.  Treated with vancomycin and Zosyn. Vancomycin  subsequently discontinued 03/17/14.  Follow-up pleural fluid cultures.    Cancer related pain/abdominal pain/knee pain  Management per palliative care recommendations.    Nausea without vomiting  Continue anti-emetics as needed.    Loss of appetite / severe protein calorie malnutrition  Dietitian consulted. Continue Marinol and nutritional supplements.    Depression  Continue Elavil.    Hypokalemia  Give 2 runs IV potassium today.    Rheumatoid arthritis  On methotrexate and prednisone, we'll need to reconsider these medications if she becomes leukopenic/neutropenic.  Anemia of chronic disease/thrombocytopenia/leukopenia  Depressed hemoglobin and platelet count worrisome for bone marrow involvement. Monitor counts closely and transfuse as needed.  Status post 2 units of PRBCs 03/17/14 and an additional 2 units 03/19/14.  WBC now dropping.  Monitor.    Acute encephalopathy  Likely secondary to side effects of anxiolytics. Resolved.    DVT Prophylaxis  Continue SCDs.    Code Status: Full. Family Communication: Kenneth (significant other) updated at bedside 03/20/14. Disposition Plan: Home when stable.   IV Access:    Right upper extremity PICC line placed 03/18/14   Procedures and diagnostic studies:   Bronchoscopy 03/04/2014 - Cytology brushings from RML and RUL; EBBx from RUL bronchus and a nodule from the R mainstem; TBNA targeting pretracheal and subtracheal lymph nodes; All specimens sent for cytology and surg path.  Ct Abdomen Pelvis W Contrast 03/17/2014 1. Progressive enlargement of right pleural effusion with visible new atelectasis and collapse of the right lower lung. Correlation with chest x-ray a may be helpful as this may now represent a largely drowned right lung. 2. New small left pleural effusion. 3. 3 right hepatic lesions are identified, likely representing metastatic disease to the liver. The largest measures 1.5 cm. No other evidence of  metastatic disease in the abdomen   or pelvis.   Dg Chest Port 1 View 03/17/2014 Complete opacification of the right hemithorax due to a combination of right effusion and right upper lobe consolidation with right upper lobe mass as noted on recent CT. Suspect a combination of interstitial edema and redistribution of blood flow the left lung. No airspace consolidation on the left.   Ir Thoracentesis Asp Pleural Space W/img Guide 03/18/2014: Successful ultrasound guided right thoracentesis yielding 600 cc of pleural fluid.   Electronically Signed   By: Maryclare Bean M.D.   On: 03/18/2014 16:21      Medical Consultants:    Oncology (Dr. Burney Gauze)  Interventional Radiology (Dr. Barbie Banner)  Palliative care (Dr. Timmie Foerster)  Anti-Infectives:    Zosyn 03/03/2014   Vancomycin 03/07/2014 --> 03/17/2014  Subjective:   Debbie Howe is more weak and lethargic today.  Minimally responsive.    Objective:    Filed Vitals:   03/21/14 0700 03/21/14 0750 03/21/14 0800 03/21/14 1000  BP: 118/66   105/68  Pulse: 108   129  Temp:   98.9 F (37.2 C)   TempSrc:   Axillary   Resp: 17   20  Height:      Weight:      SpO2: 99% 97%  96%    Intake/Output Summary (Last 24 hours) at 03/21/14 1254 Last data filed at 03/21/14 1100  Gross per 24 hour  Intake    606 ml  Output   1950 ml  Net  -1344 ml    Exam: Gen:  More lethargic Cardiovascular:  Tachycardic, No M/R/G Respiratory:  Lungs diminished Gastrointestinal:  Abdomen soft, NT/ND, + BS Extremities:  No C/E/C   Data Reviewed:    Labs: Basic Metabolic Panel:  Recent Labs Lab 03/18/14 0420 03/18/14 1500 03/19/14 0405 03/20/14 0430 03/21/14 0452  NA 143 142 144 145 145  K 2.8* 4.4 4.3 3.7 3.5*  CL 101 104 105 105 103  CO2 _0 GLUCOSE 92 141* 125* 116* 119*  BUN 14 14 28* 40* 49*  CREATININE 0.47* 0.65 0.79 1.12* 1.58*  CALCIUM 9.6 8.5 9.1 8.6 9.4  MG  --   --   --   --  2.1  PHOS  --   --   --   --   5.4*   GFR Estimated Creatinine Clearance: 29.6 mL/min (by C-G formula based on Cr of 1.58). Liver Function Tests:  Recent Labs Lab 03/16/14 0710 03/18/14 0420 03/19/14 0405 03/20/14 0430 03/21/14 0452  AST 266* 103* 228* 247* 135*  ALT 56* 50* 81* 75* 62*  ALKPHOS 249* 248* 212* 215* 181*  BILITOT 0.6 0.5 0.4 0.4 0.4  PROT 5.9* 5.6* 4.9* 5.2* 5.4*  ALBUMIN 1.7* 1.7* 1.6* 1.8* 1.9*   Coagulation profile  Recent Labs Lab 03/18/14 0911  INR 1.13    CBC:  Recent Labs Lab 03/16/14 0710 03/18/14 0420 03/19/14 0405 03/20/14 0430 03/21/14 0452  WBC 8.0 7.4 5.7 5.0 2.0*  HGB 8.9* 11.3* 7.2* 10.0* 9.5*  HCT 27.8* 34.5* 21.7* 29.7* 28.0*  MCV 93.0 89.1 90.8 89.5 89.7  PLT 66* 41* 78* 43* 30*    BNP (last 3 results)  Recent Labs  03/10/2014 1515  PROBNP 769.4*   Sepsis Labs:  Recent Labs Lab 03/18/14 0420 03/19/14 0405 03/20/14 0430 03/21/14 0452  WBC 7.4 5.7 5.0 2.0*   Microbiology Recent Results (from the past 240 hour(s))  Urine culture     Status: None   Collection Time:  03/15/2014  2:01 PM  Result Value Ref Range Status   Specimen Description URINE, CLEAN CATCH  Final   Special Requests NONE  Final   Culture  Setup Time   Final    02/16/2014 21:10 Performed at [facility]   Colony Count   Final    >=100,000 COLONIES/ML Performed at [facility]   Culture   Final    Multiple bacterial morphotypes present, none predominant. Suggest appropriate recollection if clinically indicated. Performed at [facility]   Report Status 03/13/2014 FINAL  Final  Blood culture (routine x 2)     Status: None   Collection Time: 03/03/2014  2:20 PM  Result Value Ref Range Status   Specimen Description BLOOD RIGHT ANTECUBITAL  Final   Special Requests BOTTLES DRAWN AEROBIC AND ANAEROBIC 5MLS  Final   Culture  Setup Time   Final    02/14/2014 20:52 Performed at [facility]    Culture   Final    NO GROWTH 5 DAYS Performed at  [facility]    Report Status 03/18/2014 FINAL  Final  Blood culture (routine x 2)     Status: None   Collection Time: 02/21/2014  3:15 PM  Result Value Ref Range Status   Specimen Description BLOOD RIGHT ANTECUBITAL  Final   Special Requests BOTTLES DRAWN AEROBIC AND ANAEROBIC 10MLS  Final   Culture  Setup Time   Final    03/11/2014 20:53 Performed at [facility]    Culture   Final    NO GROWTH 5 DAYS Performed at [facility]    Report Status 03/18/2014 FINAL  Final  MRSA PCR Screening     Status: Abnormal   Collection Time: 03/16/14  2:42 AM  Result Value Ref Range Status   MRSA by PCR POSITIVE (A) NEGATIVE Final    Comment:        The GeneXpert MRSA Assay (FDA approved for NASAL specimens only), is one component of a comprehensive MRSA colonization surveillance program. It is not intended to diagnose MRSA infection nor to guide or monitor treatment for MRSA infections. RESULT CALLED TO, READ BACK BY AND VERIFIED WITH: CALLED TO RN LETICIA DOBSON [id number] @[id number] THANEY  Body fluid culture     Status: None (Preliminary result)   Collection Time: 03/18/14  3:45 PM  Result Value Ref Range Status   Specimen Description PLEURAL  Final   Special Requests NONE  Final   Gram Stain   Final    RARE WBC PRESENT,BOTH PMN AND MONONUCLEAR NO ORGANISMS SEEN Performed at [facility]    Culture   Final    NO GROWTH 3 DAYS Performed at [facility]    Report Status PENDING  Incomplete     Medications:   . sodium chloride   Intravenous Once  . sodium chloride   Intravenous Once  . allopurinol  100 mg Oral Daily  . amitriptyline  100 mg Oral QHS  . dronabinol  5 mg Oral BID AC  . filgrastim  480 mcg Subcutaneous Daily  . fluconazole (DIFLUCAN) IV  100 mg Intravenous Q24H  . folic acid  1 mg Oral Daily  . ipratropium-albuterol  3 mL Nebulization TID  . mupirocin ointment   Nasal BID  . ondansetron (ZOFRAN) IV  8 mg Intravenous 3 times  per day  . pantoprazole (PROTONIX) IV  40 mg Intravenous Q12H  . piperacillin-tazobactam (ZOSYN)  IV  3.375 g Intravenous Q8H  .   polyethylene glycol  17 g Oral Daily  . predniSONE  10 mg Oral Q breakfast  . protein supplement  1 scoop Oral BID WC  . sodium chloride  3 mL Intravenous Q12H   Continuous Infusions: . sodium chloride 25 mL/hr at 03/20/14 2000    Time spent: 35 minutes.  The patient is critically ill and requires high complexity decision making.   LOS: 9 days   RAMA,CHRISTINA  [facility] Pager [phone]. If unable to reach me by pager, please call my cell phone at [phone].  *Please refer to [email or web address], password [email or web address] to get updated schedule on who will round on this patient, as hospitalists switch teams weekly. If 7PM-7AM, please contact night-coverage at [email or web address], password [email or web address] for any overnight needs.  03/21/2014, 12:54 PM            

## 2014-03-22 ENCOUNTER — Other Ambulatory Visit (HOSPITAL_COMMUNITY): Payer: Medicare Other

## 2014-03-22 ENCOUNTER — Inpatient Hospital Stay (HOSPITAL_COMMUNITY): Payer: Medicare Other

## 2014-03-22 DIAGNOSIS — Q898 Other specified congenital malformations: Secondary | ICD-10-CM

## 2014-03-22 DIAGNOSIS — D709 Neutropenia, unspecified: Secondary | ICD-10-CM

## 2014-03-22 DIAGNOSIS — D61818 Other pancytopenia: Secondary | ICD-10-CM

## 2014-03-22 LAB — BODY FLUID CULTURE: CULTURE: NO GROWTH

## 2014-03-22 LAB — COMPREHENSIVE METABOLIC PANEL
ALT: 56 U/L — AB (ref 0–35)
AST: 102 U/L — AB (ref 0–37)
Albumin: 1.9 g/dL — ABNORMAL LOW (ref 3.5–5.2)
Alkaline Phosphatase: 151 U/L — ABNORMAL HIGH (ref 39–117)
Anion gap: 10 (ref 5–15)
BUN: 41 mg/dL — ABNORMAL HIGH (ref 6–23)
CO2: 31 mEq/L (ref 19–32)
CREATININE: 1.32 mg/dL — AB (ref 0.50–1.10)
Calcium: 8.8 mg/dL (ref 8.4–10.5)
Chloride: 103 mEq/L (ref 96–112)
GFR calc non Af Amer: 40 mL/min — ABNORMAL LOW (ref >90)
GFR, EST AFRICAN AMERICAN: 46 mL/min — AB (ref >90)
Glucose, Bld: 100 mg/dL — ABNORMAL HIGH (ref 70–99)
Potassium: 3.9 mEq/L (ref 3.7–5.3)
SODIUM: 144 meq/L (ref 137–147)
TOTAL PROTEIN: 5.5 g/dL — AB (ref 6.0–8.3)
Total Bilirubin: 0.4 mg/dL (ref 0.3–1.2)

## 2014-03-22 LAB — CBC
HCT: 26.4 % — ABNORMAL LOW (ref 36.0–46.0)
Hemoglobin: 8.8 g/dL — ABNORMAL LOW (ref 12.0–15.0)
MCH: 30.6 pg (ref 26.0–34.0)
MCHC: 33.3 g/dL (ref 30.0–36.0)
MCV: 91.7 fL (ref 78.0–100.0)
Platelets: 23 10*3/uL — CL (ref 150–400)
RBC: 2.88 MIL/uL — ABNORMAL LOW (ref 3.87–5.11)
RDW: 15.4 % (ref 11.5–15.5)
WBC: 1.2 10*3/uL — CL (ref 4.0–10.5)

## 2014-03-22 LAB — PHOSPHORUS: Phosphorus: 3.7 mg/dL (ref 2.3–4.6)

## 2014-03-22 MED ORDER — SODIUM CHLORIDE 0.9 % IV SOLN
250.0000 mg | Freq: Four times a day (QID) | INTRAVENOUS | Status: DC
  Administered 2014-03-22 – 2014-03-28 (×26): 250 mg via INTRAVENOUS
  Filled 2014-03-22 (×28): qty 250

## 2014-03-22 NOTE — Progress Notes (Signed)
Debbie Howe   DOB:04-Jun-1945   XI#:[id number]   JQB#:[id number]  Patient Care Team: Charletta Cousin, MD as PCP - General (Family Medicine)  Subjective: Events since 11/5 noted. Overnight, she was found to be lethargic, with periods of moaning. She is on IV Dilaudid, which may have caused respiratory depression.   Scheduled Meds: . sodium chloride   Intravenous Once  . sodium chloride   Intravenous Once  . amitriptyline  100 mg Oral QHS  . dronabinol  5 mg Oral BID AC  . filgrastim  480 mcg Subcutaneous Daily  . fluconazole (DIFLUCAN) IV  100 mg Intravenous Q24H  . folic acid  1 mg Oral Daily  . ipratropium-albuterol  3 mL Nebulization TID  . mupirocin ointment   Nasal BID  . ondansetron (ZOFRAN) IV  8 mg Intravenous 3 times per day  . pantoprazole (PROTONIX) IV  40 mg Intravenous Q12H  . piperacillin-tazobactam (ZOSYN)  IV  3.375 g Intravenous Q8H  . polyethylene glycol  17 g Oral Daily  . predniSONE  10 mg Oral Q breakfast  . protein supplement  1 scoop Oral BID WC  . sodium chloride  3 mL Intravenous Q12H   Continuous Infusions: . sodium chloride 75 mL/hr at 03/21/14 2245   PRN Meds:ALPRAZolam, bisacodyl, feeding supplement (ENSURE COMPLETE), HYDROmorphone (DILAUDID) injection, ipratropium-albuterol, oxyCODONE, sodium chloride, sodium chloride, sodium chloride, sodium chloride, sodium chloride, sodium chloride   Objective:  Filed Vitals:   03/22/14 0800  BP:   Pulse:   Temp: 98.5 F (36.9 C)  Resp:       Intake/Output Summary (Last 24 hours) at 03/22/14 0857 Last data filed at 03/22/14 0600  Gross per 24 hour  Intake 1527.17 ml  Output   3700 ml  Net -2172.83 ml    GENERAL: lethargic, in no distress and  appears more comfortable. ill appearing.  EYES: normal, conjunctiva are pink and non-injected, sclera clear OROPHARYNX:no exudate, no erythema and lips, buccal mucosa, and tongue normal  LUNGS: bilateral expiratory wheezing, Decreased breath sounds on the right.   HEART: regular rate & rhythm and no murmurs and no lower extremity edema ABDOMEN:abdomen soft, non-tender and active bowel sounds. Urinary catheter to gravity.   CBG (last 3)  No results for input(s): GLUCAP in the last 72 hours.   Labs:   Recent Labs Lab 03/18/14 0420 03/19/14 0405 03/20/14 0430 03/21/14 0452 03/22/14 0645  WBC 7.4 5.7 5.0 2.0* 1.2*  HGB 11.3* 7.2* 10.0* 9.5* 8.8*  HCT 34.5* 21.7* 29.7* 28.0* 26.4*  PLT 41* 78* 43* 30* 23*  MCV 89.1 90.8 89.5 89.7 91.7  MCH 29.2 30.1 30.1 30.4 30.6  MCHC 32.8 33.2 33.7 33.9 33.3  RDW 16.7* 16.9* 15.4 15.7* 15.4     Chemistries:    Recent Labs Lab 03/18/14 0420 03/18/14 1500 03/19/14 0405 03/20/14 0430 03/21/14 0452 03/22/14 0645  NA 143 142 144 145 145 144  K 2.8* 4.4 4.3 3.7 3.5* 3.9  CL 101 104 105 105 103 103  CO2 29 25 27 25 28 31   GLUCOSE 92 141* 125* 116* 119* 100*  BUN 14 14 28* 40* 49* 41*  CREATININE 0.47* 0.65 0.79 1.12* 1.58* 1.32*  CALCIUM 9.6 8.5 9.1 8.6 9.4 8.8  MG  --   --   --   --  2.1  --   AST 103*  --  228* 247* 135* 102*  ALT 50*  --  81* 75* 62* 56*  ALKPHOS 248*  --  212* 215* 181*  151*  BILITOT 0.5  --  0.4 0.4 0.4 0.4    GFR Estimated Creatinine Clearance: 35.4 mL/min (by C-G formula based on Cr of 1.32).  Liver Function Tests:  Recent Labs Lab 03/18/14 0420 03/19/14 0405 03/20/14 0430 03/21/14 0452 03/22/14 0645  AST 103* 228* 247* 135* 102*  ALT 50* 81* 75* 62* 56*  ALKPHOS 248* 212* 215* 181* 151*  BILITOT 0.5 0.4 0.4 0.4 0.4  PROT 5.6* 4.9* 5.2* 5.4* 5.5*  ALBUMIN 1.7* 1.6* 1.8* 1.9* 1.9*   No results for input(s): LIPASE, AMYLASE in the last 168 hours. No results for input(s): AMMONIA in the last 168 hours.  Urine Studies     Component Value Date/Time   COLORURINE AMBER* 02/22/2014 1401   APPEARANCEUR CLOUDY* 03/03/2014 1401   LABSPEC 1.017 02/14/2014 1401   PHURINE 6.0 03/07/2014 1401   GLUCOSEU NEGATIVE 03/09/2014 1401   HGBUR NEGATIVE 03/04/2014  1401   BILIRUBINUR MODERATE* 02/25/2014 1401   KETONESUR 40* 02/28/2014 1401   PROTEINUR NEGATIVE 02/28/2014 1401   UROBILINOGEN 1.0 02/20/2014 1401   NITRITE NEGATIVE 03/04/2014 1401   LEUKOCYTESUR SMALL* 03/13/2014 1401    Coagulation profile  Recent Labs Lab 03/18/14 0911  INR 1.13    Recent Results (from the past 240 hour(s))  Urine culture     Status: None   Collection Time: 03/09/2014  2:01 PM  Result Value Ref Range Status   Specimen Description URINE, CLEAN CATCH  Final   Special Requests NONE  Final   Culture  Setup Time   Final    02/19/2014 21:10 Performed at [facility]   Final    >=100,000 COLONIES/ML Performed at [facility]   Culture   Final    Multiple bacterial morphotypes present, none predominant. Suggest appropriate recollection if clinically indicated. Performed at [facility]   Report Status 03/13/2014 FINAL  Final  Blood culture (routine x 2)     Status: None   Collection Time: 02/25/2014  2:20 PM  Result Value Ref Range Status   Specimen Description BLOOD RIGHT ANTECUBITAL  Final   Special Requests BOTTLES DRAWN AEROBIC AND ANAEROBIC 5MLS  Final   Culture  Setup Time   Final    03/03/2014 20:52 Performed at [facility]    Culture   Final    NO GROWTH 5 DAYS Performed at [facility]    Report Status 03/18/2014 FINAL  Final  Blood culture (routine x 2)     Status: None   Collection Time: 03/04/2014  3:15 PM  Result Value Ref Range Status   Specimen Description BLOOD RIGHT ANTECUBITAL  Final   Special Requests BOTTLES DRAWN AEROBIC AND ANAEROBIC 10MLS  Final   Culture  Setup Time   Final    02/16/2014 20:53 Performed at [facility]    Culture   Final    NO GROWTH 5 DAYS Performed at [facility]    Report Status 03/18/2014 FINAL  Final  MRSA PCR Screening     Status: Abnormal   Collection Time: 03/16/14  2:42 AM  Result Value Ref Range Status   MRSA by PCR  POSITIVE (A) NEGATIVE Final    Comment:        The GeneXpert MRSA Assay (FDA approved for NASAL specimens only), is one component of a comprehensive MRSA colonization surveillance program. It is not intended to diagnose MRSA infection nor to guide or monitor treatment for MRSA infections. RESULT CALLED TO,  READ BACK BY AND VERIFIED WITH: CALLED TO RN LETICIA DOBSON [id number] @[phone]  THANEY  Body fluid culture     Status: None (Preliminary result)   Collection Time: 03/18/14  3:45 PM  Result Value Ref Range Status   Specimen Description PLEURAL  Final   Special Requests NONE  Final   Gram Stain   Final    RARE WBC PRESENT,BOTH PMN AND MONONUCLEAR NO ORGANISMS SEEN Performed at [facility]    Culture   Final    NO GROWTH 3 DAYS Performed at [facility]    Report Status PENDING  Incomplete       Imaging Studies:  Dg Chest Port 1 View  03/22/2014   CLINICAL DATA:  Worsened shortness of breath.  Pneumonia.  EXAM: PORTABLE CHEST - 1 VIEW  COMPARISON:  03/21/2014 and previous  FINDINGS: Right arm PICC remains in place with its tip at the SVC RA junction or proximal right atrium. The left lung remains clear except for mild atelectasis at the left base. The continues to be complete opacification of the right hemi thorax. Mediastinum is not shifted.  IMPRESSION: No change. Complete opacification of the right hemi thorax without mediastinal shift. Mild patchy density at the left lung base.   Electronically Signed   By: Nelson Chimes M.D.   On: 03/22/2014 00:31   Dg Chest Port 1 View  03/21/2014   CLINICAL DATA:  Pneumonia.  EXAM: PORTABLE CHEST - 1 VIEW  COMPARISON:  03/18/2014.  FINDINGS: PICC line in stable position. Mediastinum and left hilar structures are unremarkable. Heart size normal. There is complete opacification of the right hemithorax again noted. Right mainstem bronchus is poorly visualized and may be occluded. Previously identified hilar mass as noted on prior CT  of 02/18/2014 . There is volume loss on the right suggesting atelectasis of the right lung. Associated right pleural effusion cannot be excluded. Mild left base subsegmental atelectasis versus infiltrate. No pneumothorax. Prior cervical spine fusion. No acute bony abnormality.  IMPRESSION: 1. Again noted is complete opacification of the right hemithorax with volume loss on the right. The right mainstem bronchus is poorly visualized and may be occluded by previously identified right hilar mass as noted on prior CT of 03/03/2014. Associated right pleural effusion may be present. 2. Mild left base subsegmental atelectasis and or infiltrate.   Electronically Signed   By: Marcello Moores  Register   On: 03/21/2014 07:24    Assessment/Plan: 68 y.o.   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  Potassium and Calcium normal.  Uric acid on 11/5 was 6.0 Monitor for tumor lysis syndrome  Pancytopenia Due to bone marrow involvement and recent chemotherapy and acute illness, infection  a. Thrombocytopenia In addition, she has a history of RA which may have  affected counts due to Methotrexate (last dose on 10/30) Platelets today 23k from 30k May need transfusion for platelet count of 10k or 20k if bleeding Continue prednisone No heparin products.  b. Anemia s/p transfusion of 2 units of blood on 11/3 Hb today 8.8. No acute bleeding issues No transfusion indicated at this time Continue folic acid  c. Neutropenia She received Neupogen on 11/5 for WBC of 2.0, now dropping to 1.2 Continue Neupogen daily to reach [place] of 1.5 Add  differential to CBC drawn on 11/7  Decreased appetite On Marinol  Full Code  Other medical issues as per Primary  team    [facility] E, PA-C 03/22/2014  8:57 AM  ADDENDUM:  I saw and examined the patient this morning. She looks a little bit worse. Her chest x-ray still shows the complete right lung obstruction.  Her labs show that her kidney function looks a little bit better. Her liver tests are a little bit better.  Her blood counts are going down. I do have her on Neupogen. Her hemoglobin is 8.8. Plan Is down to 23. She does not need to be transfused right now. But I suspect she probably will need to be transfused over the weekend.  I spent about a half-hour talk with her husband. I told him that I am treating her to try to get her better so that she can go home. I said that with small cell lung cancer, typically, the responses are seen in a week. I will like to think that over the weekend, she might start to get better with air movement into the right lung.  I told him that we can treat this and that more than placentae 5% of patients arm responding. He will though we cannot cure this, we can still treated to make her life better.  I told him that she can certainly get worse before she gets better. With her blood counts going down, we'll have to worry about infection. She is on antibiotics right now.  We still have a very long way to go.  We will he need to get her eating. She needs nutrition. I would hate to put a feeding tube into her to feed her. I suppose this could be an option.  I appreciated the great care that she's getting down in the ICU. I suspect that she will be down there for another week or more.  After talking to her her husband, I think he has a good understanding of what our goals are. Again, I want to try to get her better so she can go home. I am still optimistic.  Lum Keas

## 2014-03-22 NOTE — Plan of Care (Signed)
Problem: Phase I Progression Outcomes Goal: Pain controlled with appropriate interventions Outcome: Progressing     

## 2014-03-22 NOTE — Progress Notes (Signed)
Shift event: RN spoke to this NP concerning pt's increased lethargy and shallow respirations with "rattling". Also, some mottling of LEs as well. Pt is a FULL CODE with lung cancer and mets to bone (new). She did receive a small dose of Dilaudid prior to this.  NP to bedside. Pt appears frail and chronically ill. She moans to chest rub and says her name after being moved around in bed. When asleep, her respirations were 8-10 and when awake go up to 14. O2 sats are normal on O2. Audible congestion noted and confirmed with ascultation to the upper chest. Diminished breath sounds on the right. Rhonchi upper chest bilaterally. Skin is pale, warm and dry. Some mottling noted to thighs but not to distal LEs.  A/P: 1. Respiratory depression likely secondary to pain meds, but she is arousable and needs the pain meds for cancer. She has been lethargic on and off since admission. R/p CXR to evaluate PNA/right lobe opacification. Compared to earlier CXR, looks about the same. Will continue to monitor for now. Can use Narcan if needed.  Clance Boll, NP [facility]

## 2014-03-22 NOTE — Progress Notes (Signed)
Patient ID: Debbie Howe, female   DOB: 1946-04-10, 68 y.o.   MRN: [id number] [facility] PROGRESS NOTE  Benjamin Merrihew WIO:[id number] DOB: [birth year] DOA: 03/11/2014 PCP: Charletta Cousin, MD  Brief narrative: 68 y.o. female with a PMH of rheumatoid arthritis, depression who was admitted to [facility] 02/24/2014 with shortness of breath. 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 Patient was transferred to [facility] 10/3/12015 for planned chemotherapy.   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  Further staging workup consistent with osseous and liver metastasis. No evidence of brain metastasis.  Evaluated by the palliative care team 03/17/14 who continues to follow her course.  Chemotherapy initiated 03/18/2014 under Dr. Antonieta Pert care. Continue to monitor for tumor lysis syndrome. Potassium is within normal limits this morning. Phosphorus is within normal limits, 3.7. Magnesium and uric acid within normal limits.  In regards to pleural effusion, therapeutic/diagnostic thoracentesis done 03/18/14, 600 cc pleural fluid removed. Culture results so far show no growth. Cytology results are not yet available.  Patient is currently on Ventimask to keep oxygen saturation above 90%.  Chest x-ray obtained 03/22/2014 with findings of no changes since prior study, complete opacification of right hemithorax and mild patchy density at  left lung base. Please note that chest x-ray was obtained because patient was found to be more lethargic last night with decreased respirations. Her respiratory system improved with oxygen support.  Continue current nebulizer treatments, DuoNeb 3 times daily scheduled along with duoneb every 2 hours as needed for shortness of breath or wheezing.  Active Problems:  Acute kidney injury  Acute renal insufficiency likely secondary to acute infection, sepsis.  Creatinine ranges 1.12 - 1.58.    Sepsis secondary to community-acquired postobstructive pneumonia  Sepsis criteria met on admission with fever, tachycardia, leukocytosis and probable postobstructive pneumonia.  Treated with vancomycin and Zosyn. Vancomycin subsequently discontinued 03/17/14.  Analysis from pleural fluid cultures so far shows no growth.  Patient is currently on Zosyn only.   Cancer related pain/abdominal pain/knee pain / anxiety   Management per palliative care recommendations.  Current pain regimen is with Dilaudid 0.5 mg IV every 2 hours for severe pain, oxycodone 10 mg by mouth every 4 hours as needed for moderate pain.  Patient also has Xanax 0.25 mg by mouth twice a day when necessary for anxiety.    Nausea without vomiting  Continue anti-emetics as needed.   Loss of appetite / severe protein calorie malnutrition  Dietitian consulted. Continue Marinol and nutritional supplements.  Obtain swallowing evaluation. A lot of gurgling sounds appreciated on physical exam   Depression  Continue Elavil.   Hypokalemia  Perhaps related to sepsis and acute infection.  Potassium supplemented and it is within normal limits today.   Rheumatoid arthritis  Patient is on low-dose prednisone, 10 mg daily.  White blood cell count is 1.2, dropped from 2.0 in past 24 hours.  Order placed for filgrastim daily.  Anemia of chronic disease/thrombocytopenia/leukopenia  Depressed hemoglobin and platelet  count worrisome for bone marrow involvement. Monitor counts closely  and transfuse as needed.  Status post 2 units of PRBCs 03/17/14 and an additional 2 units 03/19/14. Hemoglobin is 8.8 this morning.  In regards to thrombocytopenia, platelet count is 23 this morning.  White blood cell count is 1.2 this morning. Patient is on filgrastim daily.   Acute encephalopathy  Likely secondary to side effects of anxiolytics, pain meds. Mental status better this morning.     GI prophylaxis  Continue Protonix since patient is on steroids.  DVT Prophylaxis  Continue SCDs.  Code Status: Full. Family Communication: [name] (significant other) updated at bedside 03/22/14. Disposition Plan: remains in SDU.   IV access:   Right upper extremity PICC line placed 03/18/14  Procedures and diagnostic studies:    Bronchoscopy 03/11/2014 - Cytology brushings from RML and RUL; EBBx from RUL bronchus and a nodule from the R mainstem; TBNA targeting pretracheal and subtracheal lymph nodes; All specimens sent for cytology and surg path.  Ct Abdomen Pelvis W Contrast 03/17/2014 1. Progressive enlargement of right pleural effusion with visible new atelectasis and collapse of the right lower lung. Correlation with chest x-ray a may be helpful as this may now represent a largely drowned right lung. 2. New small left pleural effusion. 3. 3 right hepatic lesions are identified, likely representing metastatic disease to the liver. The largest measures 1.5 cm. No other evidence of metastatic disease in the abdomen or pelvis.   Dg Chest Port 1 View 03/17/2014 Complete opacification of the right hemithorax due to a combination of right effusion and right upper lobe consolidation with right upper lobe mass as noted on recent CT. Suspect a combination of interstitial edema and redistribution of blood flow the left lung. No airspace consolidation on the left.   Ir Thoracentesis Asp Pleural Space W/img Guide 03/18/2014:  Successful ultrasound guided right thoracentesis yielding 600 cc of pleural fluid.   Medical Consultants:   Oncology (Dr. Burney Gauze)  Interventional Radiology (Dr. Barbie Banner)  Palliative care (Dr. Timmie Foerster)  Other Consultants:   Nutrition   SLP  IAnti-Infectives:    Prentiss 03/04/2014 -->  Vancomycin 03/06/2014 --> 03/17/2014   Leisa Lenz, MD  [facility] Pager [phone]  If 7PM-7AM, please contact night-coverage [email or web address] Password [email or web address] 03/22/2014, 2:49 PM   LOS: 10 days    HPI/Subjective: Overnight, patient was more lethargic with decreased respirations. Patient was seen by floor coverage during the nighttime. Her respiratory status improved with oxygen support and Ventimask.  Objective: Filed Vitals:   03/22/14 0942 03/22/14 1000 03/22/14 1200 03/22/14 1444  BP:  156/82    Pulse:  113    Temp:   98.6 F (37 C)   TempSrc:   Oral   Resp:  8    Height:      Weight:      SpO2: 97% 96%  100%    Intake/Output Summary (Last 24 hours) at 03/22/14 1449 Last data filed at 03/22/14 1203  Gross per 24 hour  Intake 2177.17 ml  Output   2150 ml  Net  27.17 ml    Exam:   General:  Pt is awake, has a Ventimask on  Cardiovascular: tachycardic, regular rhythm, S1/S2 appreciated  Respiratory: very congested, rhonchi throughout, gurgling sounds appreciated, wheezing  Abdomen: non tender, bowel sounds present  Extremities: pulses DP and PT palpable bilaterally  Neuro: Grossly nonfocal  Data Reviewed: Basic Metabolic Panel:  Recent Labs Lab 03/18/14 1500 03/19/14 0405 03/20/14 0430 03/21/14 0452 03/22/14 0645  NA 142 144 145 145 144  K  4.4 4.3 3.7 3.5* 3.9  CL 104 105 105 103 103  CO2 25 27 25 28 31   GLUCOSE 141* 125* 116* 119* 100*  BUN 14 28* 40* 49* 41*  CREATININE 0.65 0.79 1.12* 1.58* 1.32*  CALCIUM 8.5 9.1 8.6 9.4 8.8  MG  --   --   --  2.1  --   PHOS  --   --   --  5.4* 3.7   Liver Function Tests:  Recent Labs Lab  03/18/14 0420 03/19/14 0405 03/20/14 0430 03/21/14 0452 03/22/14 0645  AST 103* 228* 247* 135* 102*  ALT 50* 81* 75* 62* 56*  ALKPHOS 248* 212* 215* 181* 151*  BILITOT 0.5 0.4 0.4 0.4 0.4  PROT 5.6* 4.9* 5.2* 5.4* 5.5*  ALBUMIN 1.7* 1.6* 1.8* 1.9* 1.9*   No results for input(s): LIPASE, AMYLASE in the last 168 hours. No results for input(s): AMMONIA in the last 168 hours. CBC:  Recent Labs Lab 03/18/14 0420 03/19/14 0405 03/20/14 0430 03/21/14 0452 03/22/14 0645  WBC 7.4 5.7 5.0 2.0* 1.2*  HGB 11.3* 7.2* 10.0* 9.5* 8.8*  HCT 34.5* 21.7* 29.7* 28.0* 26.4*  MCV 89.1 90.8 89.5 89.7 91.7  PLT 41* 78* 43* 30* 23*   Cardiac Enzymes: No results for input(s): CKTOTAL, CKMB, CKMBINDEX, TROPONINI in the last 168 hours. BNP: Invalid input(s): POCBNP CBG: No results for input(s): GLUCAP in the last 168 hours.  Blood culture (routine x 2)     Status: None   Collection Time: 03/16/2014  3:15 PM  Result Value Ref Range Status   Specimen Description BLOOD RIGHT ANTECUBITAL  Final   Special Requests BOTTLES DRAWN AEROBIC AND ANAEROBIC 10MLS  Final   Culture  Setup Time   Final    02/16/2014 20:53 Performed at [facility]    Culture   Final    NO GROWTH 5 DAYS   Report Status 03/18/2014 FINAL  Final  MRSA PCR Screening     Status: Abnormal   Collection Time: 03/16/14  2:42 AM  Result Value Ref Range Status   MRSA by PCR POSITIVE (A) NEGATIVE Final    Comment:        Body fluid culture     Status: None   Collection Time: 03/18/14  3:45 PM  Result Value Ref Range Status   Specimen Description PLEURAL  Final   Special Requests NONE  Final   Gram Stain   Final    RARE WBC PRESENT,BOTH PMN AND MONONUCLEAR NO ORGANISMS SEEN   Culture   Final    NO GROWTH 3 DAYS   Report Status 03/22/2014 FINAL  Final     Scheduled Meds: . amitriptyline  100 mg Oral QHS  . dronabinol  5 mg Oral BID AC  . filgrastim  480 mcg Subcutaneous Daily  . fluconazole   100 mg Intravenous  Q24H  . folic acid  1 mg Oral Daily  . ipratropium-albuterol  3 mL Nebulization TID  . mupirocin ointment   Nasal BID  . ondansetron (ZOFRAN)   8 mg Intravenous 3 times per day  . pantoprazole   40 mg Intravenous Q12H  . piperacillin-tazobactam   3.375 g Intravenous Q8H  . polyethylene glycol  17 g Oral Daily  . predniSONE  10 mg Oral Q breakfast  . protein supplement  1 scoop Oral BID WC   Continuous Infusions: . sodium chloride 75 mL/hr at 03/21/14 2245        3

## 2014-03-23 ENCOUNTER — Inpatient Hospital Stay (HOSPITAL_COMMUNITY): Payer: Medicare Other

## 2014-03-23 DIAGNOSIS — T451X5A Adverse effect of antineoplastic and immunosuppressive drugs, initial encounter: Secondary | ICD-10-CM

## 2014-03-23 DIAGNOSIS — E870 Hyperosmolality and hypernatremia: Secondary | ICD-10-CM | POA: Diagnosis not present

## 2014-03-23 DIAGNOSIS — J189 Pneumonia, unspecified organism: Secondary | ICD-10-CM | POA: Insufficient documentation

## 2014-03-23 DIAGNOSIS — D61810 Antineoplastic chemotherapy induced pancytopenia: Secondary | ICD-10-CM | POA: Diagnosis not present

## 2014-03-23 LAB — URINALYSIS, ROUTINE W REFLEX MICROSCOPIC
Bilirubin Urine: NEGATIVE
Glucose, UA: NEGATIVE mg/dL
KETONES UR: NEGATIVE mg/dL
Leukocytes, UA: NEGATIVE
NITRITE: NEGATIVE
PROTEIN: 30 mg/dL — AB
Specific Gravity, Urine: 1.015 (ref 1.005–1.030)
Urobilinogen, UA: 0.2 mg/dL (ref 0.0–1.0)
pH: 5.5 (ref 5.0–8.0)

## 2014-03-23 LAB — COMPREHENSIVE METABOLIC PANEL
ALBUMIN: 1.7 g/dL — AB (ref 3.5–5.2)
ALK PHOS: 123 U/L — AB (ref 39–117)
ALT: 44 U/L — ABNORMAL HIGH (ref 0–35)
AST: 68 U/L — AB (ref 0–37)
Anion gap: 12 (ref 5–15)
BILIRUBIN TOTAL: 0.3 mg/dL (ref 0.3–1.2)
BUN: 32 mg/dL — ABNORMAL HIGH (ref 6–23)
CHLORIDE: 111 meq/L (ref 96–112)
CO2: 30 mEq/L (ref 19–32)
CREATININE: 0.97 mg/dL (ref 0.50–1.10)
Calcium: 9.3 mg/dL (ref 8.4–10.5)
GFR calc Af Amer: 67 mL/min — ABNORMAL LOW (ref >90)
GFR calc non Af Amer: 58 mL/min — ABNORMAL LOW (ref >90)
Glucose, Bld: 91 mg/dL (ref 70–99)
Potassium: 3.5 mEq/L — ABNORMAL LOW (ref 3.7–5.3)
Sodium: 153 mEq/L — ABNORMAL HIGH (ref 137–147)
Total Protein: 5.3 g/dL — ABNORMAL LOW (ref 6.0–8.3)

## 2014-03-23 LAB — CBC
HEMATOCRIT: 25.4 % — AB (ref 36.0–46.0)
Hemoglobin: 8.3 g/dL — ABNORMAL LOW (ref 12.0–15.0)
MCH: 30.4 pg (ref 26.0–34.0)
MCHC: 32.7 g/dL (ref 30.0–36.0)
MCV: 93 fL (ref 78.0–100.0)
PLATELETS: 12 10*3/uL — AB (ref 150–400)
RBC: 2.73 MIL/uL — AB (ref 3.87–5.11)
RDW: 15.4 % (ref 11.5–15.5)
WBC: 0.6 10*3/uL — AB (ref 4.0–10.5)

## 2014-03-23 LAB — URINE MICROSCOPIC-ADD ON

## 2014-03-23 LAB — PHOSPHORUS: Phosphorus: 3.6 mg/dL (ref 2.3–4.6)

## 2014-03-23 LAB — DIFFERENTIAL
BASOS ABS: 0 10*3/uL (ref 0.0–0.1)
BASOS PCT: 0 % (ref 0–1)
Eosinophils Absolute: 0 10*3/uL (ref 0.0–0.7)
Eosinophils Relative: 2 % (ref 0–5)
Lymphocytes Relative: 63 % — ABNORMAL HIGH (ref 12–46)
Lymphs Abs: 0.4 10*3/uL — ABNORMAL LOW (ref 0.7–4.0)
MONOS PCT: 2 % — AB (ref 3–12)
Monocytes Absolute: 0 10*3/uL — ABNORMAL LOW (ref 0.1–1.0)
NEUTROS ABS: 0.2 10*3/uL — AB (ref 1.7–7.7)
Neutrophils Relative %: 33 % — ABNORMAL LOW (ref 43–77)

## 2014-03-23 LAB — PREPARE RBC (CROSSMATCH)

## 2014-03-23 MED ORDER — POTASSIUM CHLORIDE 10 MEQ/50ML IV SOLN
10.0000 meq | INTRAVENOUS | Status: AC
Start: 2014-03-23 — End: 2014-03-23
  Administered 2014-03-23 (×2): 10 meq via INTRAVENOUS
  Filled 2014-03-23 (×2): qty 50

## 2014-03-23 MED ORDER — CHLORHEXIDINE GLUCONATE 0.12 % MT SOLN
OROMUCOSAL | Status: AC
  Administered 2014-03-23: 15 mL
  Filled 2014-03-23: qty 15

## 2014-03-23 MED ORDER — CHLORHEXIDINE GLUCONATE 0.12 % MT SOLN
15.0000 mL | Freq: Two times a day (BID) | OROMUCOSAL | Status: DC
  Administered 2014-03-24 – 2014-03-25 (×3): 15 mL via OROMUCOSAL
  Filled 2014-03-23 (×3): qty 15

## 2014-03-23 MED ORDER — CETYLPYRIDINIUM CHLORIDE 0.05 % MT LIQD
7.0000 mL | Freq: Two times a day (BID) | OROMUCOSAL | Status: DC
  Administered 2014-03-24 – 2014-03-25 (×4): 7 mL via OROMUCOSAL

## 2014-03-23 MED ORDER — SODIUM CHLORIDE 0.9 % IV SOLN: Freq: Once | INTRAVENOUS | Status: DC

## 2014-03-23 MED ORDER — FUROSEMIDE 10 MG/ML IJ SOLN
60.0000 mg | Freq: Once | INTRAMUSCULAR | Status: AC
  Administered 2014-03-23: 60 mg via INTRAVENOUS
  Filled 2014-03-23: qty 6

## 2014-03-23 MED ORDER — FUROSEMIDE 10 MG/ML IJ SOLN
40.0000 mg | Freq: Once | INTRAMUSCULAR | Status: AC
  Administered 2014-03-23: 40 mg via INTRAVENOUS
  Filled 2014-03-23: qty 4

## 2014-03-23 MED ORDER — METOPROLOL TARTRATE 1 MG/ML IV SOLN
2.5000 mg | Freq: Four times a day (QID) | INTRAVENOUS | Status: DC | PRN
  Administered 2014-03-23 – 2014-03-27 (×5): 2.5 mg via INTRAVENOUS
  Filled 2014-03-23 (×6): qty 5

## 2014-03-23 MED ORDER — CHLORHEXIDINE GLUCONATE CLOTH 2 % EX PADS
6.0000 | MEDICATED_PAD | Freq: Every day | CUTANEOUS | Status: AC
  Administered 2014-03-24 – 2014-03-28 (×5): 6 via TOPICAL

## 2014-03-23 MED ORDER — MUPIROCIN 2 % EX OINT
1.0000 "application " | TOPICAL_OINTMENT | Freq: Two times a day (BID) | CUTANEOUS | Status: AC
  Administered 2014-03-23 – 2014-03-28 (×10): 1 via NASAL
  Filled 2014-03-23 (×2): qty 22

## 2014-03-23 MED ORDER — MAGIC MOUTHWASH W/LIDOCAINE
5.0000 mL | Freq: Four times a day (QID) | ORAL | Status: DC | PRN
  Administered 2014-03-23 – 2014-03-28 (×6): 5 mL via ORAL
  Filled 2014-03-23 (×8): qty 5

## 2014-03-23 MED ORDER — HYDRALAZINE HCL 20 MG/ML IJ SOLN
10.0000 mg | Freq: Four times a day (QID) | INTRAMUSCULAR | Status: DC | PRN
  Administered 2014-03-23 – 2014-03-27 (×3): 10 mg via INTRAVENOUS
  Filled 2014-03-23 (×3): qty 1

## 2014-03-23 MED ORDER — METOPROLOL TARTRATE 1 MG/ML IV SOLN
INTRAVENOUS | Status: AC
  Filled 2014-03-23: qty 5

## 2014-03-23 MED ORDER — SCOPOLAMINE 1 MG/3DAYS TD PT72
1.0000 | MEDICATED_PATCH | TRANSDERMAL | Status: DC
  Administered 2014-03-23 – 2014-03-29 (×3): 1.5 mg via TRANSDERMAL
  Filled 2014-03-23 (×4): qty 1

## 2014-03-23 NOTE — Progress Notes (Signed)
Progress Note   Debbie Howe YHC:[id number] DOB: June 30, 1945 DOA: 02/20/2014 PCP: Charletta Cousin, MD   Brief Narrative:   Debbie Howe is an 68 y.o. female with a PMH of rheumatoid arthritis, depression who was admitted to [facility] 02/26/2014 with shortness of breath. 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 Patient was transferred to [facility] 10/3/[address]015 for planned chemotherapy.    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  Further staging workup consistent with osseous and liver metastasis. No evidence of brain metastasis.  Evaluated by the palliative care team 03/17/14 who continues to follow her course.  Dr. Marin Olp following for initiation of chemotherapy (paraplatin), which was started 03/18/14. Monitor closely for tumor lysis syndrome. Potassium stable at 3.5 today.  Phosphorus 3.6.  Therapeutic/diagnostic thoracentesis done 03/18/14, 600 cc pleural fluid removed, pleural fluid cultures negative.  Continue nebulized bronchodilators and supplemental oxygen to keep saturations greater than 90%.  Active Problems:   Severe protein calorie malnutrition  Seen by dietitian 03/20/14. Supplements per recommendations.    Cancer related pain  Palliative care team following.    Hypernatremia  Increase IV fluids.    Acute kidney injury  Creatinine normalized with increased IV fluids.    Sepsis secondary to  community-acquired postobstructive pneumonia  Sepsis criteria met on admission with fever, tachycardia, leukocytosis and probable postobstructive pneumonia.  Treated with vancomycin and Zosyn. Vancomycin subsequently discontinued 03/17/14.  Pleural fluid cultures negative.    Cancer related pain/abdominal pain/knee pain  Management per palliative care recommendations.    Nausea without vomiting  Continue anti-emetics as needed.    Loss of appetite / severe protein calorie malnutrition  Dietitian consulted. Continue Marinol and nutritional supplements.  Swallowing evaluation requested secondary to concerns for ability to protect airway.    Depression  Continue Elavil.    Hypokalemia  Give 2 runs IV potassium today.    Rheumatoid arthritis  On methotrexate and prednisone, we'll need to reconsider these medications if she becomes leukopenic/neutropenic.    Anemia of chronic disease/thrombocytopenia/leukopenia / pancytopenia  Depressed hemoglobin and platelet count worrisome for bone marrow involvement, with chemotherapy now contributory. Monitor counts closely and transfuse as needed.  1 unit of platelets ordered.  Has now become leukopenic, check differential to ensure she is not neutropenic.  Neupogen started.  Status post 2 units of PRBCs 03/17/14 and an additional 2 units 03/19/14.  For an additional unit today.    Acute encephalopathy  Likely secondary to side effects of anxiolytics. Resolved.    DVT Prophylaxis  Continue SCDs.    Code Status: Full. Family Communication: Debbie Howe (significant other) updated at bedside 03/20/14. Disposition Plan: Home when stable.   IV Access:    Right upper extremity PICC line placed 03/18/14   Procedures and diagnostic studies:   Bronchoscopy 02/26/2014 - Cytology brushings from RML and RUL; EBBx from RUL bronchus and a nodule from the R mainstem; TBNA targeting pretracheal and subtracheal lymph nodes; All specimens sent  for cytology and surg path.  Ct Abdomen  Pelvis W Contrast 03/17/2014 1. Progressive enlargement of right pleural effusion with visible new atelectasis and collapse of the right lower lung. Correlation with chest x-ray a may be helpful as this may now represent a largely drowned right lung. 2. New small left pleural effusion. 3. 3 right hepatic lesions are identified, likely representing metastatic disease to the liver. The largest measures 1.5 cm. No other evidence of metastatic disease in the abdomen or pelvis.   Dg Chest Port 1 View 03/17/2014 Complete opacification of the right hemithorax due to a combination of right effusion and right upper lobe consolidation with right upper lobe mass as noted on recent CT. Suspect a combination of interstitial edema and redistribution of blood flow the left lung. No airspace consolidation on the left.   Ir Thoracentesis Asp Pleural Space W/img Guide 03/18/2014: Successful ultrasound guided right thoracentesis yielding 600 cc of pleural fluid.   Electronically Signed   By: Maryclare Bean M.D.   On: 03/18/2014 16:21    Dg Chest Port 1 View 03/21/2014: 1. Again noted is complete opacification of the right hemithorax with volume loss on the right. The right mainstem bronchus is poorly visualized and may be occluded by previously identified right hilar mass as noted on prior CT of 03/02/2014. Associated right pleural effusion may be present. 2. Mild left base subsegmental atelectasis and or infiltrate.     Dg Chest Port 1 View 03/22/2014: No change. Complete opacification of the right hemi thorax without mediastinal shift. Mild patchy density at the left lung base.      Medical Consultants:    Oncology (Dr. Burney Gauze)  Interventional Radiology (Dr. Barbie Banner)  Palliative care (Dr. Timmie Foerster)  Anti-Infectives:    Zosyn 02/17/2014   Vancomycin 02/17/2014 --> 03/17/2014  Subjective:   Debbie Howe is a bit more responsive today. She has a lot of chest  congestion and upper airway gurgling.   Denies pain or subjective dyspnea. Moist cough. Poor appetite.    Objective:    Filed Vitals:   03/23/14 0000 03/23/14 0200 03/23/14 0400 03/23/14 0600  BP: 127/55 124/63 166/61 152/73  Pulse: 102 100 111 109  Temp: 97.7 F (36.5 C)  98.7 F (37.1 C)   TempSrc: Oral  Axillary   Resp: _0 Height:      Weight:   61.7 kg (136 lb 0.4 oz)   SpO2: 97% 97% 96% 95%    Intake/Output Summary (Last 24 hours) at 03/23/14 0718 Last data filed at 03/23/14 0600  Gross per 24 hour  Intake   2678 ml  Output   2220 ml  Net    458 ml    Exam: Gen:  Mild respiratory distress Cardiovascular:  Tachycardic, No M/R/G Respiratory:  Lungs with scattered rhonchi Gastrointestinal:  Abdomen soft, NT/ND, + BS Extremities:  No C/E/C   Data Reviewed:    Labs: Basic Metabolic Panel:  Recent Labs Lab 03/19/14 0405 03/20/14 0430 03/21/14 0452 03/22/14 0645 03/23/14 0525  NA 144 145 145 144 153*  K 4.3 3.7 3.5* 3.9 3.5*  CL 105 105 103 103 111  CO2 _1 GLUCOSE 125* 116* 119* 100* 91  BUN 28* 40* 49* 41* 32*  CREATININE 0.79 1.12* 1.58* 1.32* 0.97  CALCIUM 9.1 8.6 9.4 8.8 9.3  MG  --   --  2.1  --   --   PHOS  --   --  5.4* 3.7 3.6   GFR Estimated Creatinine Clearance:  46.6 mL/min (by C-G formula based on Cr of 0.97). Liver Function Tests:  Recent Labs Lab 03/19/14 0405 03/20/14 0430 03/21/14 0452 03/22/14 0645 03/23/14 0525  AST 228* 247* 135* 102* 68*  ALT 81* 75* 62* 56* 44*  ALKPHOS 212* 215* 181* 151* 123*  BILITOT 0.4 0.4 0.4 0.4 0.3  PROT 4.9* 5.2* 5.4* 5.5* 5.3*  ALBUMIN 1.6* 1.8* 1.9* 1.9* 1.7*   Coagulation profile  Recent Labs Lab 03/18/14 0911  INR 1.13    CBC:  Recent Labs Lab 03/19/14 0405 03/20/14 0430 03/21/14 0452 03/22/14 0645 03/23/14 0525  WBC 5.7 5.0 2.0* 1.2* 0.6*  HGB 7.2* 10.0* 9.5* 8.8* 8.3*  HCT 21.7* 29.7* 28.0* 26.4* 25.4*  MCV 90.8 89.5 89.7 91.7 93.0  PLT 78* 43* 30*  23* 12*    BNP (last 3 results)  Recent Labs  03/03/2014 1515  PROBNP 769.4*   Sepsis Labs:  Recent Labs Lab 03/20/14 0430 03/21/14 0452 03/22/14 0645 03/23/14 0525  WBC 5.0 2.0* 1.2* 0.6*   Microbiology Recent Results (from the past 240 hour(s))  MRSA PCR Screening     Status: Abnormal   Collection Time: 03/16/14  2:42 AM  Result Value Ref Range Status   MRSA by PCR POSITIVE (A) NEGATIVE Final    Comment:        The GeneXpert MRSA Assay (FDA approved for NASAL specimens only), is one component of a comprehensive MRSA colonization surveillance program. It is not intended to diagnose MRSA infection nor to guide or monitor treatment for MRSA infections. RESULT CALLED TO, READ BACK BY AND VERIFIED WITH: CALLED TO RN Donald Prose DOBSON [id number] _[id number]  THANEY  Body fluid culture     Status: None   Collection Time: 03/18/14  3:45 PM  Result Value Ref Range Status   Specimen Description PLEURAL  Final   Special Requests NONE  Final   Gram Stain   Final    RARE WBC PRESENT,BOTH PMN AND MONONUCLEAR NO ORGANISMS SEEN Performed at [facility]    Culture   Final    NO GROWTH 3 DAYS Performed at [facility]    Report Status 03/22/2014 FINAL  Final     Medications:   . sodium chloride   Intravenous Once  . sodium chloride   Intravenous Once  . amitriptyline  100 mg Oral QHS  . dronabinol  5 mg Oral BID AC  . filgrastim  480 mcg Subcutaneous Daily  . fluconazole (DIFLUCAN) IV  100 mg Intravenous Q24H  . folic acid  1 mg Oral Daily  . imipenem-cilastatin  250 mg Intravenous Q6H  . ipratropium-albuterol  3 mL Nebulization TID  . mupirocin ointment   Nasal BID  . ondansetron (ZOFRAN) IV  8 mg Intravenous 3 times per day  . pantoprazole (PROTONIX) IV  40 mg Intravenous Q12H  . polyethylene glycol  17 g Oral Daily  . predniSONE  10 mg Oral Q breakfast  . protein supplement  1 scoop Oral BID WC  . sodium chloride  3 mL Intravenous Q12H   Continuous  Infusions: . sodium chloride 75 mL/hr at 03/22/14 1637    Time spent: 35 minutes.  The patient is critically ill and requires high complexity decision making.   LOS: 11 days   [facility] Pager [phone]. If unable to reach me by pager, please call my cell phone at [phone].  *Please refer to [email or web address], password [email or web address] to get updated schedule on who will round on this patient, as  hospitalists switch teams weekly. If 7PM-7AM, please contact night-coverage at [email or web address], password [email or web address] for any overnight needs.  03/23/2014, 7:18 AM

## 2014-03-23 NOTE — Progress Notes (Signed)
SLP Cancellation Note  Patient Details Name: Dereona Kolodny MRN: [id number] DOB: 31-Dec-1945   Cancelled treatment:        Pt not appropriate for BSE at this time, given increase in O2 requirement, increased HR, RR, congestion. Recommend NPO until respiratory status more stable. RN aware and in agreement.  Michel Hendon B. [place], [facility], CCC-SLP [phone]  Shonna Chock 03/23/2014, 4:54 PM

## 2014-03-23 NOTE — Progress Notes (Signed)
Mrs. Sarnowski looks rougher this AM.  Has a lot of "congestion."  There is some blood with suctioning.  I will give her 2 units of platelets. Her hemoglobin is 8.3. I will will also give her one unit of packed blood cells.  She is definitely neutropenic. Her white count is now down to 0.6. She is on Primaxin. She is on Neupogen.  Her sodium is 153. Potassium is okay. Her renal function continues to improve. Her liver tests continue to improve.  We are getting a chest x-ray on her today.  When I saw her last night, she clearly looked a lot better. Her breathing seemed to be doing a little better. She is more alert. She had some family with her. We talked about her situation.  We, I think, are going to have a tough time right now. She is already pancytopenic. Again, she has bone marrow involvement. Her blood counts will continue to "bottom out" as the cancer and the bone marrow is cleared out.  Somehow, it would not surprise me if she had to be intubated at some point if we cannot get the right lung open with treatment. I would like to think that we should see a response with the right lung obstruction in a couple of days.  Her vital signs are still pretty stable. She is afebrile. Her blood pressure is 152/73. Oxygen saturation on facemask is 95%. Her lungs show some rhonchi bilaterally. Cardiac exam is tachycardic but regular. Abdomen is soft. Bowel sounds are slightly decreased. Neurological exam is nonfocal.  Again, things could certainly get worse before they get better. She, I suspect, will be pancytopenic for several days.list have to support her through the pancytopenia and await for her bone marrow to recover.  Again she is on Primaxin. I don't know if she will need any additional coverage, such as gentamicin. Sometimes, gentamicin gets good penetration into the lungs with patient's to have post obstructive issues.  I very much appreciate all the great care that she is getting from the staff  in the ICU. The hospitalist or doing a fantastic job. This is definitely a tough case. Given that she has small cell lung cancer, I am still hopeful and optimistic that she will have a response and improve.  We will continue pray hard for her.  Frederich Cha 1:12

## 2014-03-24 ENCOUNTER — Other Ambulatory Visit: Payer: Self-pay | Admitting: Internal Medicine

## 2014-03-24 ENCOUNTER — Inpatient Hospital Stay (HOSPITAL_COMMUNITY): Payer: Medicare Other

## 2014-03-24 DIAGNOSIS — R0600 Dyspnea, unspecified: Secondary | ICD-10-CM

## 2014-03-24 DIAGNOSIS — D61810 Antineoplastic chemotherapy induced pancytopenia: Secondary | ICD-10-CM

## 2014-03-24 DIAGNOSIS — C3411 Malignant neoplasm of upper lobe, right bronchus or lung: Principal | ICD-10-CM

## 2014-03-24 DIAGNOSIS — A419 Sepsis, unspecified organism: Secondary | ICD-10-CM

## 2014-03-24 DIAGNOSIS — J189 Pneumonia, unspecified organism: Secondary | ICD-10-CM | POA: Insufficient documentation

## 2014-03-24 DIAGNOSIS — J9811 Atelectasis: Secondary | ICD-10-CM

## 2014-03-24 DIAGNOSIS — J90 Pleural effusion, not elsewhere classified: Secondary | ICD-10-CM | POA: Insufficient documentation

## 2014-03-24 LAB — TYPE AND SCREEN
ABO/RH(D): O POS
Antibody Screen: NEGATIVE
Unit division: 0

## 2014-03-24 LAB — COMPREHENSIVE METABOLIC PANEL
ALBUMIN: 2.1 g/dL — AB (ref 3.5–5.2)
ALK PHOS: 116 U/L (ref 39–117)
ALT: 34 U/L (ref 0–35)
ANION GAP: 15 (ref 5–15)
AST: 56 U/L — ABNORMAL HIGH (ref 0–37)
BILIRUBIN TOTAL: 0.6 mg/dL (ref 0.3–1.2)
BUN: 29 mg/dL — AB (ref 6–23)
CHLORIDE: 104 meq/L (ref 96–112)
CO2: 34 meq/L — AB (ref 19–32)
Calcium: 9.4 mg/dL (ref 8.4–10.5)
Creatinine, Ser: 0.88 mg/dL (ref 0.50–1.10)
GFR calc Af Amer: 75 mL/min — ABNORMAL LOW (ref >90)
GFR, EST NON AFRICAN AMERICAN: 65 mL/min — AB (ref >90)
GLUCOSE: 100 mg/dL — AB (ref 70–99)
Potassium: 2.9 mEq/L — CL (ref 3.7–5.3)
Sodium: 153 mEq/L — ABNORMAL HIGH (ref 137–147)
Total Protein: 6 g/dL (ref 6.0–8.3)

## 2014-03-24 LAB — PREPARE PLATELET PHERESIS
UNIT DIVISION: 0
Unit division: 0

## 2014-03-24 LAB — BODY FLUID CELL COUNT WITH DIFFERENTIAL
Eos, Fluid: 2 %
Lymphs, Fluid: 14 %
Monocyte-Macrophage-Serous Fluid: 2 % — ABNORMAL LOW (ref 50–90)
Neutrophil Count, Fluid: 82 % — ABNORMAL HIGH (ref 0–25)
Other Cells, Fluid: 13 %
Total Nucleated Cell Count, Fluid: 2003 cu mm — ABNORMAL HIGH (ref 0–1000)

## 2014-03-24 LAB — CBC WITH DIFFERENTIAL/PLATELET
BASOS PCT: 0 % (ref 0–1)
Basophils Absolute: 0 10*3/uL (ref 0.0–0.1)
Eosinophils Absolute: 0 10*3/uL (ref 0.0–0.7)
Eosinophils Relative: 4 % (ref 0–5)
HCT: 31.7 % — ABNORMAL LOW (ref 36.0–46.0)
HEMOGLOBIN: 10.6 g/dL — AB (ref 12.0–15.0)
Lymphocytes Relative: 90 % — ABNORMAL HIGH (ref 12–46)
Lymphs Abs: 0.5 10*3/uL — ABNORMAL LOW (ref 0.7–4.0)
MCH: 29.9 pg (ref 26.0–34.0)
MCHC: 33.4 g/dL (ref 30.0–36.0)
MCV: 89.5 fL (ref 78.0–100.0)
MONO ABS: 0 10*3/uL — AB (ref 0.1–1.0)
Monocytes Relative: 0 % — ABNORMAL LOW (ref 3–12)
NEUTROS PCT: 6 % — AB (ref 43–77)
Neutro Abs: 0 10*3/uL — ABNORMAL LOW (ref 1.7–7.7)
Platelets: 91 10*3/uL — ABNORMAL LOW (ref 150–400)
RBC: 3.54 MIL/uL — ABNORMAL LOW (ref 3.87–5.11)
RDW: 16.2 % — ABNORMAL HIGH (ref 11.5–15.5)
WBC: 0.5 10*3/uL — CL (ref 4.0–10.5)

## 2014-03-24 LAB — MAGNESIUM: Magnesium: 1.6 mg/dL (ref 1.5–2.5)

## 2014-03-24 LAB — PHOSPHORUS: Phosphorus: 3.8 mg/dL (ref 2.3–4.6)

## 2014-03-24 MED ORDER — POTASSIUM CHLORIDE 10 MEQ/100ML IV SOLN
10.0000 meq | INTRAVENOUS | Status: AC
Start: 2014-03-24 — End: 2014-03-24
  Administered 2014-03-24 (×6): 10 meq via INTRAVENOUS
  Filled 2014-03-24: qty 100

## 2014-03-24 MED ORDER — DEXTROSE 5 % IV SOLN
INTRAVENOUS | Status: DC
  Administered 2014-03-24 – 2014-03-28 (×5): via INTRAVENOUS

## 2014-03-24 NOTE — Progress Notes (Signed)
Progress Note   [name] HAL:[id number] DOB: 1946-01-07 DOA: 02/19/2014 PCP: Charletta Cousin, MD   Brief Narrative:   Debbie Howe is an 68 y.o. female with a PMH of rheumatoid arthritis, depression who was admitted to [facility] 02/14/2014 with shortness of breath. 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 Patient was transferred to [facility] 10/3/12015 for planned chemotherapy.    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 No evidence of brain metastasis.  Evaluated by the palliative care team 03/17/14 who continues to follow her course.  Dr. Marin Olp following for initiation of chemotherapy (paraplatin), which was started 03/18/14. No evidence of tumor lysis syndrome.   Therapeutic/diagnostic thoracentesis done 03/18/14, 600 cc pleural fluid removed, pleural fluid cultures negative.  Will repeat.  Continue nebulized bronchodilators and supplemental oxygen to keep saturations greater than 90%.  Given Lasix x 2 yesterday due to increased chest congestion, concerns for pulmonary edema contributing to deteriorating respiratory status.    Active Problems:   MRSA carrier  Continue decontamination therapy.    Severe protein calorie malnutrition  Seen by dietitian 03/20/14. Supplements per recommendations.    Cancer related pain  Palliative care team  following.    Hypernatremia  No improvement despite increased IVF, and due to concerns for CHF, feel further IVF increase would risk deterioration of respiratory status.  IVF currently at [facility].  NPO however, so will increase IVF to 50 cc/hr.      Acute kidney injury  Creatinine normalized with increased IV fluids.    Sepsis secondary to community-acquired postobstructive pneumonia  Sepsis criteria met on admission with fever, tachycardia, leukocytosis and probable postobstructive pneumonia.  Treated with vancomycin and Zosyn. Vancomycin subsequently discontinued 03/17/14.  Pleural fluid cultures negative.    Cancer related pain/abdominal pain/knee pain  Management per palliative care recommendations.    Nausea without vomiting  Continue anti-emetics as needed.    Loss of appetite / severe protein calorie malnutrition  Dietitian consulted. Continue Marinol and nutritional supplements.  Swallowing evaluation requested secondary to concerns for ability to protect airway.  Deferred 03/23/14 secondary to increased oxygen requirement, increased HR/RR and congestion.  NPO for now.  Hopefully ST will evaluate today.    Depression  Continue Elavil.    Hypokalemia  Give 6 runs IV potassium today.    Rheumatoid arthritis  Continue prednisone.  Methotrexate on hold.    Anemia of chronic disease/thrombocytopenia/leukopenia / pancytopenia  Depressed hemoglobin and platelet count worrisome for bone marrow involvement, with chemotherapy now contributory. Monitor counts closely and transfuse as needed.  Continue Neupogen.  ANC is zero.  Status post a total of 5 units of PRBCs and 2 units of platelets.  Hgb and platelet count stable post transfusion.    Acute encephalopathy  Likely secondary to side effects of anxiolytics. Resolved.    DVT Prophylaxis  Continue SCDs.    Code Status: Full. Family Communication: [name] (significant other) updated at bedside. Disposition Plan:  Home when stable.  IV Access:    Right upper extremity PICC line placed 03/18/14   Procedures and diagnostic studies:   Bronchoscopy 03/16/2014 - Cytology brushings from RML and RUL; EBBx from RUL bronchus and a nodule from the R mainstem; TBNA targeting pretracheal and subtracheal lymph nodes; All specimens sent for cytology and surg path.  Ct Abdomen Pelvis W Contrast 03/17/2014 1. Progressive enlargement of right pleural effusion with visible new atelectasis and collapse of the right lower lung. Correlation with chest x-ray a may be helpful as this may now represent a largely drowned right lung. 2. New small left pleural effusion. 3. 3 right hepatic lesions are identified, likely representing metastatic disease to the liver. The largest measures 1.5 cm. No other evidence of metastatic disease in the abdomen or pelvis.   Dg Chest Port 1 View 03/17/2014 Complete opacification of the right hemithorax due to a combination of right effusion and right upper lobe consolidation with right upper lobe mass as noted on recent CT. Suspect a combination of interstitial edema and redistribution of blood flow the left lung. No airspace consolidation on the left.   Ir Thoracentesis Asp Pleural Space W/img Guide 03/18/2014: Successful ultrasound guided right thoracentesis yielding 600 cc of pleural fluid.   Electronically Signed   By: Maryclare Bean M.D.   On: 03/18/2014 16:21   Dg chest 1 view 03/18/14: 1. Grossly unchanged near complete opacification of the right lung secondary to unchanged moderate-sized effusion and extensive right lung consolidative opacities / drowned lung. No evidence of pneumothorax. 2. Right upper extremity approach PICC line tip projects over the superior cavoatrial junction.   Dg Chest Port 1 View 03/21/2014: 1. Again noted is complete opacification of the right hemithorax with volume loss on the right. The right mainstem bronchus is poorly visualized and may be occluded by previously  identified right hilar mass as noted on prior CT of 02/28/2014. Associated right pleural effusion may be present. 2. Mild left base subsegmental atelectasis and or infiltrate.     Dg Chest Port 1 View 03/22/2014: No change. Complete opacification of the right hemi thorax without mediastinal shift. Mild patchy density at the left lung base.     Dg Chest Port 1 View 03/23/2014: No change from the earlier study.     Dg Chest Port 1 View 03/23/2014: No significant interval change in the appearance of the chest. Persistent whiteout of the right hemi thorax with probable combination of pleural effusion and postobstructive collapse.     Medical Consultants:    Oncology (Dr. Burney Gauze)  Interventional Radiology (Dr. Barbie Banner)  Palliative care (Dr. Timmie Foerster)  Anti-Infectives:    Zosyn 03/10/2014   Vancomycin 02/18/2014 --> 03/17/2014  Subjective:   Debbie Howe is without complaints of dyspnea or pain.  She is still a bit lethargic.  Less gurgling to the upper airways.    Objective:    Filed Vitals:   03/24/14 0400 03/24/14 0450 03/24/14 0500 03/24/14 0600  BP: 154/88  154/90 170/84  Pulse: 109 107 104 106  Temp:  97.5 F (36.4 C)    TempSrc:  Axillary    Resp: _0 Height:      Weight:      SpO2: 95% 90% 91% 95%    Intake/Output Summary (Last 24 hours) at 03/24/14 0710 Last data filed at 03/24/14 0607  Gross per 24 hour  Intake   2215 ml  Output   5180 ml  Net  -2965 ml    Exam:  Gen:  NAD Cardiovascular:  Tachycardic, No M/R/G Respiratory:  Lungs with a few rhonchi Gastrointestinal:  Abdomen soft, NT/ND, + BS Extremities: 1+ edema   Data Reviewed:    Labs: Basic Metabolic Panel:  Recent Labs Lab 03/20/14 0430 03/21/14 0452 03/22/14 0645 03/23/14 0525 03/24/14 0445  NA 145 145 144 153* 153*  K 3.7 3.5* 3.9 3.5* 2.9*  CL 105 103 103 111 104  CO2 _0 34*  GLUCOSE 116* 119* 100* 91 100*  BUN 40* 49* 41* 32* 29*  CREATININE 1.12*  1.58* 1.32* 0.97 0.88  CALCIUM 8.6 9.4 8.8 9.3 9.4  MG  --  2.1  --   --   --   PHOS  --  5.4* 3.7 3.6 3.8   GFR Estimated Creatinine Clearance: 51.4 mL/min (by C-G formula based on Cr of 0.88). Liver Function Tests:  Recent Labs Lab 03/20/14 0430 03/21/14 0452 03/22/14 0645 03/23/14 0525 03/24/14 0445  AST 247* 135* 102* 68* 56*  ALT 75* 62* 56* 44* 34  ALKPHOS 215* 181* 151* 123* 116  BILITOT 0.4 0.4 0.4 0.3 0.6  PROT 5.2* 5.4* 5.5* 5.3* 6.0  ALBUMIN 1.8* 1.9* 1.9* 1.7* 2.1*   Coagulation profile  Recent Labs Lab 03/18/14 0911  INR 1.13    CBC:  Recent Labs Lab 03/20/14 0430 03/21/14 0452 03/22/14 0645 03/23/14 0525 03/24/14 0445  WBC 5.0 2.0* 1.2* 0.6* 0.5*  NEUTROABS  --   --   --  0.2* 0.0*  HGB 10.0* 9.5* 8.8* 8.3* 10.6*  HCT 29.7* 28.0* 26.4* 25.4* 31.7*  MCV 89.5 89.7 91.7 93.0 89.5  PLT 43* 30* 23* 12* 91*    BNP (last 3 results)  Recent Labs  02/26/2014 1515  PROBNP 769.4*   Sepsis Labs:  Recent Labs Lab 03/21/14 0452 03/22/14 0645 03/23/14 0525 03/24/14 0445  WBC 2.0* 1.2* 0.6* 0.5*   Microbiology Recent Results (from the past 240 hour(s))  MRSA PCR Screening     Status: Abnormal   Collection Time: 03/16/14  2:42 AM  Result Value Ref Range Status   MRSA by PCR POSITIVE (A) NEGATIVE Final    Comment:        The GeneXpert MRSA Assay (FDA approved for NASAL specimens only), is one component of a comprehensive MRSA colonization surveillance program. It is not intended to diagnose MRSA infection nor to guide or monitor treatment for MRSA infections. RESULT CALLED TO, READ BACK BY AND VERIFIED WITH: CALLED TO RN LETICIA DOBSON [id number] _1  THANEY  Body fluid culture     Status: None   Collection Time: 03/18/14  3:45 PM  Result Value Ref Range Status   Specimen Description PLEURAL  Final   Special Requests NONE  Final   Gram Stain   Final    RARE WBC PRESENT,BOTH PMN AND MONONUCLEAR NO ORGANISMS SEEN Performed at [facility]    Culture   Final    NO GROWTH 3 DAYS Performed at [facility]    Report Status 03/22/2014 FINAL  Final     Medications:   . sodium chloride   Intravenous Once  . sodium chloride   Intravenous Once  . antiseptic oral rinse  7 mL Mouth Rinse q12n4p  . chlorhexidine  15 mL Mouth Rinse BID  . Chlorhexidine Gluconate Cloth  6 each Topical Q0600  . dronabinol  5 mg Oral BID AC  . filgrastim  480 mcg Subcutaneous Daily  . fluconazole (DIFLUCAN) IV  100 mg Intravenous  V36P  . folic acid  1 mg Oral Daily  . imipenem-cilastatin  250 mg Intravenous Q6H  . ipratropium-albuterol  3 mL Nebulization TID  . mupirocin ointment  1 application Nasal BID  . ondansetron (ZOFRAN) IV  8 mg Intravenous 3 times per day  . pantoprazole (PROTONIX) IV  40 mg Intravenous Q12H  . polyethylene glycol  17 g Oral Daily  . potassium chloride  10 mEq Intravenous Q1 Hr x 6  . predniSONE  10 mg Oral Q breakfast  . protein supplement  1 scoop Oral BID WC  . scopolamine  1 patch Transdermal Q72H  . sodium chloride  3 mL Intravenous Q12H   Continuous Infusions: . sodium chloride 25 mL/hr at 03/23/14 0750    Time spent: 35 minutes.  The patient is critically ill and requires high complexity decision making.   LOS: 12 days   [facility] Pager [phone]. If unable to reach me by pager, please call my cell phone at [phone].  *Please refer to [email or web address], password [email or web address] to get updated schedule on who will round on this patient, as hospitalists switch teams weekly. If 7PM-7AM, please contact night-coverage at [email or web address], password [email or web address] for any overnight needs.  03/24/2014, 7:10 AM

## 2014-03-24 NOTE — Plan of Care (Signed)
Problem: Phase I Progression Outcomes Goal: OOB as tolerated unless otherwise ordered Outcome: Progressing     

## 2014-03-24 NOTE — Plan of Care (Signed)
Problem: Phase I Progression Outcomes Goal: Pain controlled with appropriate interventions Outcome: Progressing 1 mg Dilaudid q2Hrs. is controlling pain at this point.

## 2014-03-24 NOTE — Evaluation (Signed)
Clinical/Bedside Swallow Evaluation Patient Details  Name: Debbie Howe MRN: [id number] Date of Birth: Feb 01, 1946  Today's Date: 03/24/2014 Time: 9562-1308 SLP Time Calculation (min): 36 min  Past Medical History:  Past Medical History  Diagnosis Date  . Rheumatoid arthritis   . 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 Protein-calorie malnutrition, severe 03/19/2014  . Small cell carcinoma   . 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 No past surgeries    . Video bronchoscopy Bilateral 03/07/2014    Procedure: VIDEO BRONCHOSCOPY WITHOUT FLUORO;  Surgeon: Wilhelmina Mcardle, MD;  Location: [facility] ENDOSCOPY;  Service: Cardiopulmonary;  Laterality: Bilateral;   HPI:  68 year old female admitted 02/24/2014 due to SOB. PMH significant for RA. CT revealed hilar and suprahilar masses with mets to the liver and spine. BSE ordered to evaluate swallow function and safety.   Assessment / Plan / Recommendation Clinical Impression  Oral care completed with suction. Pt removed upper denture independently. On visual inspection of pt oral cavity, SLP noted large amount of what appeared to be mucus and/or dried blood surrounding the uvula and coating the velum and posterior pharyngeal wall. 2 RN's assisted SLP in removing a large amount of thick material from pt's posterior oral cavity and pharynx. Lingual surface was noted to be blistered, ulcerated and bleeding slightly. Following removal of mucus, pt voice quality improved significantly, but she began reporting pain with swallow. Slight bleeding noted in pharynx. Pt voiced desire to proceed with BSE, even though oral care with mucus plug removal was quite taxing on her. Pt was given ice chips x2, and appeared to tolerate them well. No overt s/s aspiration. Pt exhibited multiple dry swallows following 1/2 tsp bolus of applesauce and sips of water, with increase in RR and decrease in O2 sats  noted. Pt also continued to report significant pain when swallowing. At this time, vigilent, regular, thorough oral care is strongly encouraged to prevent accumulation of mucus in the oropharynx. Pt may have ice chips for oral comfort, however, additional po nutrition and hydration is not recommended at this time.  SLP will continue to follow for improvement and readiness for po intake. She is certainly improved today, but this procedure was exhausting and painful for pt, and po intake is deemed unsafe and unwise at this time.    Aspiration Risk  Moderate    Diet Recommendation NPO   Medication Administration: Via alternative means    Other  Recommendations     Follow Up Recommendations       Frequency and Duration min 2x/week  2 weeks   Pertinent Vitals/Pain VSS, grimacing with swallow    SLP Swallow Goals  po readiness   Swallow Study Prior Functional Status   No prior history of swallowing difficulty.    General Date of Onset: 02/28/2014 HPI: 68 year old female admitted 02/23/2014 due to SOB. PMH significant for RA. CT revealed hilar and suprahilar masses with mets to the liver and spine. BSE ordered to evaluate swallow function and safety. Type of Study: Bedside swallow evaluation Previous Swallow Assessment: none found Diet Prior to this Study: NPO Temperature Spikes Noted: No Respiratory Status: venti-mask History of Recent Intubation: No Behavior/Cognition: Alert;Cooperative;Pleasant mood Oral Cavity - Dentition: Dentures, top Self-Feeding Abilities: Needs assist Patient Positioning: Upright in bed Baseline Vocal Quality: Clear Volitional Cough: Strong Volitional Swallow: Able to elicit    Oral/Motor/Sensory Function Overall Oral Motor/Sensory Function: Appears within functional  limits for tasks assessed   Ice Chips Ice chips: Within functional limits Presentation: Spoon   Thin Liquid Thin Liquid: Impaired Presentation: Straw Pharyngeal  Phase Impairments: Change in  Vital Signs;Multiple swallows    Nectar Thick Nectar Thick Liquid: Not tested   Honey Thick Honey Thick Liquid: Not tested   Puree Puree: Impaired Presentation: Spoon Pharyngeal Phase Impairments: Multiple swallows;Change in Vital Signs   Solid   GO   Rayvion Stumph B. Quentin Ore [facility], CCC-SLP [phone]  Solid: Not tested       Shonna Chock 03/24/2014,3:36 PM

## 2014-03-24 NOTE — Procedures (Signed)
  US guided Rt thora (portable)  Pt tolerated well 750 cc bloody fluid Sent for labs per MD  cxr pending

## 2014-03-24 NOTE — Progress Notes (Signed)
CRITICAL VALUE ALERT  Critical value received:  K=2.9  Date of notification:  11/08.2015  Time of notification:  06:00  Critical value read back:Yes.    Nurse who received alert:  Bethann Humble, RN  MD notified (1st page):  M. Donnal Debar, NP [facility]  Time of first page:  06:00  MD notified (2nd page):  Time of second page:  Responding MD:  M. Donnal Debar, NP [facility]  Time MD responded:  06:02

## 2014-03-24 NOTE — Progress Notes (Signed)
IP PROGRESS NOTE  Subjective:   Patient is lethargic but awake. Answers questions appropriately. She appears comfortable and not in any respiratory distress. She is not reporting any pain or shortness of breath this morning.  Objective:  Vital signs in last 24 hours: Temp:  [97.3 F (36.3 C)-99.8 F (37.7 C)] 97.5 F (36.4 C) (11/08 0450) Pulse Rate:  [101-139] 101 (11/08 0730) Resp:  [10-33] 11 (11/08 0730) BP: (140-186)/(73-104) 182/91 mmHg (11/08 0730) SpO2:  [88 %-97 %] 96 % (11/08 0730) FiO2 (%):  [40 %] 40 % (11/07 1800) Weight change:  Last BM Date: 03/23/14  Intake/Output from previous day: 11/07 0701 - 11/08 0700 In: 2215 [I.V.:495; Blood:1120; IV Piggyback:600] Out: 5180 [Urine:5180]  Mouth: mucous membranes moist, pharynx normal without lesions Resp: rhonchi and wheezes scattered throughout her lung fields. Cardio: regular rate and rhythm, S1, S2 normal, no murmur, click, rub or gallop GI: soft, non-tender; bowel sounds normal; no masses,  no organomegaly Extremities: extremities normal, atraumatic, no cyanosis or edema    Lab Results:  Recent Labs  03/23/14 0525 03/24/14 0445  WBC 0.6* 0.5*  HGB 8.3* 10.6*  HCT 25.4* 31.7*  PLT 12* 91*    BMET  Recent Labs  03/23/14 0525 03/24/14 0445  NA 153* 153*  K 3.5* 2.9*  CL 111 104  CO2 30 34*  GLUCOSE 91 100*  BUN 32* 29*  CREATININE 0.97 0.88  CALCIUM 9.3 9.4    Studies/Results: Dg Chest Port 1 View  03/23/2014   CLINICAL DATA:  Short of breath today.  EXAM: PORTABLE CHEST - 1 VIEW  COMPARISON:  03/23/2014 at 7:51 a.m.  FINDINGS: Complete opacification of the right hemi thorax is without change. There is vascular prominence throughout the left lung without consolidation or edema. No left effusion or pneumothorax.  Cardiac silhouette is normal in size.  Right PICC is stable with its tip near the caval atrial junction.  IMPRESSION: No change from the earlier study.   Electronically Signed   By: Lajean Manes M.D.   On: 03/23/2014 17:21   Dg Chest Port 1 View  03/23/2014   CLINICAL DATA:  68-year-old female with increased shortness of breath. Known history of lung cancer.  EXAM: PORTABLE CHEST - 1 VIEW  COMPARISON:  Most recent prior chest x-ray 03/22/2014  FINDINGS: Persistent whiteout of the right chest. Mild pulmonary vascular congestion throughout the left lung remains stable in without evidence of overt edema. Cardiac and mediastinal contours are unchanged. Right upper extremity PICC. Catheter tip at the superior cavoatrial junction. No acute osseous abnormality.  IMPRESSION: No significant interval change in the appearance of the chest. Persistent whiteout of the right hemi thorax with probable combination of pleural effusion and postobstructive collapse.   Electronically Signed   By: Jacqulynn Cadet M.D.   On: 03/23/2014 08:03    Medications: I have reviewed the patient's current medications.  Assessment/Plan:   68 year old woman with the following issues:  1. Extensive stage small cell lung cancer diagnosed in October 2015. She is status post carboplatin and etoposide cycle 1 started on 03/18/2014. No delayed complications noted from the actual infusion of the chemotherapy.  2. Pancytopenia: Related to systemic chemotherapy. She is status post PRBC transfusion and platelet transfusions. Counts are adequate today. No need for transfusion at this time  3. Neutropenia with possible sepsis: She is currently on Neupogen and Primaxin. She is hemodynamically stable and I agree with the current treatment.  4. Respiratory distress: Her respiratory status  appears stable at this time.    LOS: 12 days   SHADAD,FIRAS 03/24/2014, 8:09 AM

## 2014-03-25 ENCOUNTER — Inpatient Hospital Stay (HOSPITAL_COMMUNITY): Payer: Medicare Other

## 2014-03-25 DIAGNOSIS — R197 Diarrhea, unspecified: Secondary | ICD-10-CM

## 2014-03-25 DIAGNOSIS — C3490 Malignant neoplasm of unspecified part of unspecified bronchus or lung: Secondary | ICD-10-CM | POA: Insufficient documentation

## 2014-03-25 DIAGNOSIS — C3492 Malignant neoplasm of unspecified part of left bronchus or lung: Secondary | ICD-10-CM

## 2014-03-25 LAB — CBC WITH DIFFERENTIAL/PLATELET
Basophils Absolute: 0 10*3/uL (ref 0.0–0.1)
Basophils Relative: 0 % (ref 0–1)
EOS ABS: 0 10*3/uL (ref 0.0–0.7)
Eosinophils Relative: 0 % (ref 0–5)
HEMATOCRIT: 29.6 % — AB (ref 36.0–46.0)
Hemoglobin: 9.8 g/dL — ABNORMAL LOW (ref 12.0–15.0)
LYMPHS ABS: 0.3 10*3/uL — AB (ref 0.7–4.0)
LYMPHS PCT: 100 % — AB (ref 12–46)
MCH: 29.7 pg (ref 26.0–34.0)
MCHC: 33.1 g/dL (ref 30.0–36.0)
MCV: 89.7 fL (ref 78.0–100.0)
Monocytes Absolute: 0 10*3/uL — ABNORMAL LOW (ref 0.1–1.0)
Monocytes Relative: 0 % — ABNORMAL LOW (ref 3–12)
NEUTROS ABS: 0 10*3/uL — AB (ref 1.7–7.7)
Neutrophils Relative %: 0 % — ABNORMAL LOW (ref 43–77)
Platelets: 53 10*3/uL — ABNORMAL LOW (ref 150–400)
RBC: 3.3 MIL/uL — ABNORMAL LOW (ref 3.87–5.11)
RDW: 16 % — AB (ref 11.5–15.5)
WBC: 0.3 10*3/uL — CL (ref 4.0–10.5)

## 2014-03-25 LAB — COMPREHENSIVE METABOLIC PANEL
ALT: 24 U/L (ref 0–35)
AST: 41 U/L — AB (ref 0–37)
Albumin: 1.9 g/dL — ABNORMAL LOW (ref 3.5–5.2)
Alkaline Phosphatase: 97 U/L (ref 39–117)
Anion gap: 11 (ref 5–15)
BILIRUBIN TOTAL: 0.4 mg/dL (ref 0.3–1.2)
BUN: 21 mg/dL (ref 6–23)
CHLORIDE: 98 meq/L (ref 96–112)
CO2: 33 meq/L — AB (ref 19–32)
CREATININE: 0.61 mg/dL (ref 0.50–1.10)
Calcium: 9.1 mg/dL (ref 8.4–10.5)
GFR calc Af Amer: >90 mL/min (ref >90)
GFR, EST NON AFRICAN AMERICAN: 90 mL/min — AB (ref >90)
Glucose, Bld: 281 mg/dL — ABNORMAL HIGH (ref 70–99)
Potassium: 2.6 mEq/L — CL (ref 3.7–5.3)
Sodium: 142 mEq/L (ref 137–147)
Total Protein: 5.6 g/dL — ABNORMAL LOW (ref 6.0–8.3)

## 2014-03-25 LAB — PHOSPHORUS: Phosphorus: 2.4 mg/dL (ref 2.3–4.6)

## 2014-03-25 LAB — MAGNESIUM: Magnesium: 1.4 mg/dL — ABNORMAL LOW (ref 1.5–2.5)

## 2014-03-25 LAB — PTH-RELATED PEPTIDE: PTH-RELATED PEPTIDE: 9 pg/mL — AB (ref 14–27)

## 2014-03-25 LAB — CLOSTRIDIUM DIFFICILE BY PCR: CDIFFPCR: NEGATIVE

## 2014-03-25 MED ORDER — POTASSIUM CHLORIDE 10 MEQ/100ML IV SOLN: 10.0000 meq | INTRAVENOUS | Status: DC

## 2014-03-25 MED ORDER — POTASSIUM CHLORIDE 10 MEQ/100ML IV SOLN
10.0000 meq | INTRAVENOUS | Status: AC
  Administered 2014-03-25 (×6): 10 meq via INTRAVENOUS
  Filled 2014-03-25 (×2): qty 100

## 2014-03-25 MED ORDER — MAGNESIUM SULFATE 2 GM/50ML IV SOLN
2.0000 g | Freq: Once | INTRAVENOUS | Status: AC
  Administered 2014-03-25: 2 g via INTRAVENOUS
  Filled 2014-03-25: qty 50

## 2014-03-25 NOTE — Plan of Care (Signed)
Problem: Phase I Progression Outcomes Goal: Pain controlled with appropriate interventions Outcome: Progressing     

## 2014-03-25 NOTE — Progress Notes (Signed)
NUTRITION FOLLOW UP  Intervention:   -If feeding tube placed, recommend Osmolite 1.2 at 20 ml/hr, increase 10 ml every 4 hours to goal of 60 ml/hr to provide:  1728 kcal, 80 gm protein, 1181 ml free water. -RD to continue to monitor  Nutrition Dx:   Inadequate oral intake related to poor appetite, illness for the last 2 months as evidenced by Meal Completion: <25%; ongoing   Goal:   Pt to meet >/= 90% of their estimated nutrition needs; not met  Monitor:   Diet advancement, Supplement tolerance, total protein/energy intake, labs, weights  Assessment:   10/28: Pt with hx of RA, admitted with SOB, pt has extensive 50 year smoking hx. Per CT pt has a large right hilar mass which is concerning for lung ca. Pulmonology consulted for biopsy.  Pt with a lot of family in the room. Pt lives with her husband and son. Per family pt has been eating very poorly for the last 1 1/2 - 2 months. 24 hr recall: Breakfast: cereal or an egg, Lunch: nothing, Dinner: bites. Pt has had a poor appetite  Pt has not tried any PO supplements before but they feel she would like chocolate ensure.  Family very concerned about pt. Pt very sleepy but states she has probably lost 25 lb. Could not confirm this as she also states she usually weighs 138   11/04: -Pt continues with poor PO intake,, 0-25% -Has not been drinking Ensure, will modify to PRN -Family has been bringing in outside food sources to encourage PO intake - chocolate milkshake, strawberries, etc -Pt reported she enjoys most foods; however has early satiety. Recommend pt trial Beneprotein protein supplement to increase nutrient density of foods pt is able to consume -Was willing to trial MagicCup as pt enjoys sweets, as well as 8PM snack of chocolate chip cookies and whole milk -Received first chemo on 11/02; pt noted to have constipation. Was given stool softener; which will likely assist in early satiety -Pt to start on Marinol BID on  11/04  11/9: -Patient with severe mucositis. -SLP has been following.  Recommends NPO currently.  Possible MBS if patient wakes up.  Wet gurgley breathing noted with probably aspiration of secretions.   -Diarrhea with question of c.diff. -Off oxygen.  Family states she looks much better than yesterday. -Possible feeding tube per oncologist if unable to eat. -Family reports patient likes Ensure but has not been able to drink/eat due to mucositis.   Height: Ht Readings from Last 1 Encounters:  02/22/2014 5' 2"  (1.575 m)    Weight Status:   Wt Readings from Last 1 Encounters:  03/23/14 136 lb 0.4 oz (61.7 kg)    Re-estimated needs:  Kcal: 1600-1800 Protein: 75-90 grams Fluid: >1.6 L/day  Skin: ecchymosis  Diet Order: Diet NPO time specified   Intake/Output Summary (Last 24 hours) at 03/25/14 1429 Last data filed at 03/25/14 1400  Gross per 24 hour  Intake   1540 ml  Output   1740 ml  Net   -200 ml    Labs:   Recent Labs Lab 03/21/14 0452  03/23/14 0525 03/24/14 0445 03/25/14 0400  NA 145  < > 153* 153* 142  K 3.5*  < > 3.5* 2.9* 2.6*  CL 103  < > 111 104 98  CO2 28  < > 30 34* 33*  BUN 49*  < > 32* 29* 21  CREATININE 1.58*  < > 0.97 0.88 0.61  CALCIUM 9.4  < >  9.3 9.4 9.1  MG 2.1  --   --  1.6 1.4*  PHOS 5.4*  < > 3.6 3.8 2.4  GLUCOSE 119*  < > 91 100* 281*  < > = values in this interval not displayed.  CBG (last 3)  No results for input(s): GLUCAP in the last 72 hours.  Scheduled Meds: . sodium chloride   Intravenous Once  . sodium chloride   Intravenous Once  . antiseptic oral rinse  7 mL Mouth Rinse q12n4p  . chlorhexidine  15 mL Mouth Rinse BID  . Chlorhexidine Gluconate Cloth  6 each Topical Q0600  . dronabinol  5 mg Oral BID AC  . filgrastim  480 mcg Subcutaneous Daily  . folic acid  1 mg Oral Daily  . imipenem-cilastatin  250 mg Intravenous Q6H  . ipratropium-albuterol  3 mL Nebulization TID  . mupirocin ointment  1 application Nasal BID  .  pantoprazole (PROTONIX) IV  40 mg Intravenous Q12H  . polyethylene glycol  17 g Oral Daily  . predniSONE  10 mg Oral Q breakfast  . protein supplement  1 scoop Oral BID WC  . scopolamine  1 patch Transdermal Q72H  . sodium chloride  3 mL Intravenous Q12H    Continuous Infusions: . dextrose 50 mL/hr at 03/25/14 0800    Antonieta Iba, RD, LDN Clinical Inpatient Dietitian Pager:  [phone] Weekend and after hours pager:  [phone]

## 2014-03-25 NOTE — Progress Notes (Signed)
Mrs. Deeg still has profound neutropenia. She was transfused over the weekend with red cells and platelets. She had a thoracentesis done. About 750 mL of fluid was removed from the right lung. Her last chest x-ray showed that the right lung was opening a little bit.  She is off facemask oxygen. On nasal cannula, her oxygen saturation is over 95%. Per a thrill problem that we have is her lack of eating. She has mucositis. She really cannot eat much. Her albumin is 1.9. She may need to have a feeding tube and. This would only be temporary until her white cell count comes back up and the mucositis heals. Her son was with her this point. I talked to him about this. He understands. We will give her 1 day. She is on oral care.  She still is tachycardic. Blood pressure has been doing okay. Her lungs sound congested with rhonchi bilaterally. Cardiac exam is tachycardic but regular. Abdomen is soft. Bowel sounds are slightly decreased. Extremity shows some most likely an upper lower extremities.  She is on Neupogen for the neutropenia. She will continue to be neutropenic. I suspect this will last for several days.  We will see what a chest x-ray looks like.  She is quite hypokalemic. She's having some diarrhea. Her C. Difficile is being checked.it would not surprise me if she is positive for C. Difficile.  I very much appreciate all the great care that she is getting. We does have to try support her through the neutropenia and the complications from this. I really expect that her right lung will open out and that her breathing will improve.  Pete E.  Psalm 27:1

## 2014-03-25 NOTE — Progress Notes (Signed)
Speech Language Pathology Treatment: Dysphagia  Patient Details Name: Debbie Howe MRN: [id number] DOB: Jan 19, 1946 Today's Date: 03/25/2014 Time: 1033-1050 SLP Time Calculation (min): 17 min  Assessment / Plan / Recommendation Clinical Impression  Pt sleepy *(received pain medicine approximately one hour ago per RN) but did awaken to verbal/tactile stimulation.  Wet gurgly breathing noted at baseline with pt requiring total verbal/visual cues to attempt to cough to clear - suspect aspiration of secretions- successful x1 of approximately 5 attempts.  Pt did not expectorate secretions after coughing however - as RN reported she was doing some earlier (with and without cues).    Lissa Hoard (visitor in room) reports pt has been gurgly on and off since admitted.  Advised him to ask pt to cough when gurgly to aid secretions removal.      Oral care provided using toothette, blood tinged secretions noted in posterior pharynx but pt resistant to oral suctioning to clear.    Recommend continue npo except ice chips/sips when fully alert - pt would benefit from MBS prior to po initiation due to RUL mass that may may impinge on vagus nerve and contribute to pharyngeal deficits.  In addition, pt's ongoing aspiration of secretions with poor awareness indicates laryngeal desensitization and increases silent aspiration risk.  Advised RN Alice to call if pt wakes adequately today for possible MBS.     HPI HPI: 68 year old female admitted 03/11/2014 due to SOB. PMH significant for RA. CT revealed hilar and suprahilar masses with mets to the liver and spine. BSE ordered to evaluate swallow function and safety. Pt with pna per CXR.  BSE completed yesterday with recommendations for NPO except ice.  Follow up to determine readiness for po intake.     Pertinent Vitals Pain Assessment: No/denies pain  SLP Plan  Continue with current plan of care    Recommendations Medication Administration: Via alternative means               Oral Care Recommendations: Oral care Q4 per protocol Follow up Recommendations:  (TBD) Plan: Continue with current plan of care    [place], [place] [facility] SLP [phone]

## 2014-03-25 NOTE — Plan of Care (Signed)
Problem: Phase I Progression Outcomes Goal: Hemodynamically stable Outcome: Progressing     

## 2014-03-25 NOTE — Progress Notes (Addendum)
Progress Note   Debbie Howe [id number]:[id number] DOB: 1945/09/01 DOA: 02/21/2014 PCP: Charletta Cousin, MD   Brief Narrative:   Debbie Howe is an 68 y.o. female with a PMH of rheumatoid arthritis, depression who was admitted to [facility] 03/08/2014 with shortness of breath. 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 Patient was transferred to [facility] 10/3/12015 and has completed her first course of chemotherapy.  Barrier to discharge his ongoing respiratory failure, weakness, and pancytopenia.  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 No evidence of brain metastasis.  Evaluated by the palliative care team 03/17/14 who continues to follow her course.  Patient remains a full code at this time.  Dr. Marin Olp following, status post first cycle of chemotherapy. No evidence of tumor lysis syndrome.   Therapeutic/diagnostic thoracentesis done 03/18/14, 600 cc pleural fluid removed, pleural fluid cultures negative.  Repeat       thoracentesis done 03/24/14 with 750 mL fluid removed. May need consideration of a Pleurx catheter.  Continue nebulized bronchodilators and supplemental oxygen to keep saturations greater than 90%.  Given Lasix x 2 03/23/14 due to increased chest congestion, concerns for pulmonary edema contributing to deteriorating      respiratory status.      Active Problems:   MRSA carrier  Continue decontamination therapy.    Severe protein calorie malnutrition  Seen by dietitian 03/20/14. Supplements per recommendations.    Cancer related pain  Palliative care team following.    Hypernatremia  Resolved with hypotonic IV fluids.      Acute kidney injury  Creatinine normalized with increased IV fluids.    Sepsis secondary to community-acquired postobstructive pneumonia  Sepsis criteria met on admission with fever, tachycardia, leukocytosis and probable postobstructive pneumonia.  Treated with vancomycin and Zosyn. Vancomycin subsequently discontinued 03/17/14.  Pleural fluid cultures negative.    Cancer related pain/abdominal pain/knee pain  Management per palliative care recommendations.    Nausea without vomiting  Continue anti-emetics as needed.    Loss of appetite / severe protein calorie malnutrition  Dietitian consulted. Continue Marinol and nutritional supplements.  Swallowing evaluation requested secondary to concerns for ability to protect airway.  Deferred 03/23/14 secondary to increased oxygen requirement, increased HR/RR and congestion.  NPO for now.  Hopefully ST will evaluate today.    Depression  Continue Elavil.    Hypokalemia / hypomagnesemia  Give 6 runs IV potassium and 2 g IV magnesium today.    Rheumatoid arthritis  Continue prednisone.  Methotrexate on hold.    Anemia of chronic disease/thrombocytopenia/leukopenia / pancytopenia  Depressed hemoglobin and platelet count worrisome for bone marrow involvement, with chemotherapy now contributory  Monitor counts closely and transfuse as needed.  Continue Neupogen.  ANC is zero.  Status post a total of 5 units of PRBCs and 2 units of platelets.  Hgb and platelet count stable post transfusion.    Acute encephalopathy  Likely secondary to side effects of anxiolytics.  Resolved.    DVT Prophylaxis  Continue SCDs.    Code Status:  Full. Family Communication: [name] (significant other) updated at bedside. Disposition Plan: Home when stable.   IV Access:    Right upper extremity PICC line placed 03/18/14   Procedures and diagnostic studies:   Bronchoscopy 02/18/2014 - Cytology brushings from RML and RUL; EBBx from RUL bronchus and a nodule from the R mainstem; TBNA targeting pretracheal and subtracheal lymph nodes; All specimens sent for cytology and surg path.  Ct Abdomen Pelvis W Contrast 03/17/2014 1. Progressive enlargement of right pleural effusion with visible new atelectasis and collapse of the right lower lung. Correlation with chest x-ray a may be helpful as this may now represent a largely drowned right lung. 2. New small left pleural effusion. 3. 3 right hepatic lesions are identified, likely representing metastatic disease to the liver. The largest measures 1.5 cm. No other evidence of metastatic disease in the abdomen or pelvis.   Dg Chest Port 1 View 03/17/2014 Complete opacification of the right hemithorax due to a combination of right effusion and right upper lobe consolidation with right upper lobe mass as noted on recent CT. Suspect a combination of interstitial edema and redistribution of blood flow the left lung. No airspace consolidation on the left.   Ir Thoracentesis Asp Pleural Space W/img Guide 03/18/2014: Successful ultrasound guided right thoracentesis yielding 600 cc of pleural fluid.   Electronically Signed   By: Maryclare Bean M.D.   On: 03/18/2014 16:21   Dg chest 1 view 03/18/14: 1. Grossly unchanged near complete opacification of the right lung secondary to unchanged moderate-sized effusion and extensive right lung consolidative opacities / drowned lung. No evidence of pneumothorax. 2. Right upper extremity approach PICC line tip projects over the superior cavoatrial junction.   Dg Chest Port 1 View 03/21/2014: 1. Again noted is complete opacification of the right hemithorax with volume loss on  the right. The right mainstem bronchus is poorly visualized and may be occluded by previously identified right hilar mass as noted on prior CT of 03/13/2014. Associated right pleural effusion may be present. 2. Mild left base subsegmental atelectasis and or infiltrate.     Dg Chest Port 1 View 03/22/2014: No change. Complete opacification of the right hemi thorax without mediastinal shift. Mild patchy density at the left lung base.     Dg Chest Port 1 View 03/23/2014: No change from the earlier study.     Dg Chest Port 1 View 03/23/2014: No significant interval change in the appearance of the chest. Persistent whiteout of the right hemi thorax with probable combination of pleural effusion and postobstructive collapse.     Thoracentesis 03/24/14:  750 mL bloody fluid removed.  Dg Chest Port 1 View 03/24/2014: 1. No pneumothorax following right thoracentesis. 2. Significantly decreased right pleural fluid. 3. Large right upper lobe mass. 4. Changes of COPD and chronic bronchitis with pulmonary vascular congestion and mild interstitial pulmonary edema.   Electronically Signed   By: Enrique Sack M.D.   On: 03/24/2014 12:03    Medical Consultants:    Oncology (Dr. Burney Gauze)  Interventional Radiology (Dr. Barbie Banner)  Palliative care (Dr. Timmie Foerster)  Anti-Infectives:    Zosyn 03/16/2014   Vancomycin 03/02/2014 --> 03/17/2014  Subjective:   Debbie Howe is without complaints of dyspnea or pain.  She is more awake today.  On nasal cannula oxygen now.  Objective:    Filed Vitals:   03/25/14 0700 03/25/14 0800 03/25/14 0900 03/25/14  0913  BP: 145/95 154/98 147/86   Pulse: 117 111 120   Temp:  98.9 F (37.2 C)    TempSrc:  Axillary    Resp: 11 14 18    Height:      Weight:      SpO2: 97% 99% 96% 98%    Intake/Output Summary (Last 24 hours) at 03/25/14 1001 Last data filed at 03/25/14 0900  Gross per 24 hour  Intake   1063 ml  Output   1725 ml  Net   -662 ml    Exam: Gen:   NAD Cardiovascular:  Tachycardic, No M/R/G Respiratory:  Lungs decreased, less rhonchi Gastrointestinal:  Abdomen soft, NT/ND, + BS Extremities: 1+ edema   Data Reviewed:    Labs: Basic Metabolic Panel:  Recent Labs Lab 03/21/14 0452 03/22/14 0645 03/23/14 0525 03/24/14 0445 03/25/14 0400  NA 145 144 153* 153* 142  K 3.5* 3.9 3.5* 2.9* 2.6*  CL 103 103 111 104 98  CO2 28 31 30  34* 33*  GLUCOSE 119* 100* 91 100* 281*  BUN 49* 41* 32* 29* 21  CREATININE 1.58* 1.32* 0.97 0.88 0.61  CALCIUM 9.4 8.8 9.3 9.4 9.1  MG 2.1  --   --  1.6 1.4*  PHOS 5.4* 3.7 3.6 3.8 2.4   GFR Estimated Creatinine Clearance: 56.5 mL/min (by C-G formula based on Cr of 0.61). Liver Function Tests:  Recent Labs Lab 03/21/14 0452 03/22/14 0645 03/23/14 0525 03/24/14 0445 03/25/14 0400  AST 135* 102* 68* 56* 41*  ALT 62* 56* 44* 34 24  ALKPHOS 181* 151* 123* 116 97  BILITOT 0.4 0.4 0.3 0.6 0.4  PROT 5.4* 5.5* 5.3* 6.0 5.6*  ALBUMIN 1.9* 1.9* 1.7* 2.1* 1.9*   Coagulation profile No results for input(s): INR, PROTIME in the last 168 hours.  CBC:  Recent Labs Lab 03/21/14 0452 03/22/14 0645 03/23/14 0525 03/24/14 0445 03/25/14 0400  WBC 2.0* 1.2* 0.6* 0.5* 0.3*  NEUTROABS  --   --  0.2* 0.0* 0.0*  HGB 9.5* 8.8* 8.3* 10.6* 9.8*  HCT 28.0* 26.4* 25.4* 31.7* 29.6*  MCV 89.7 91.7 93.0 89.5 89.7  PLT 30* 23* 12* 91* 53*    BNP (last 3 results)  Recent Labs  02/18/2014 1515  PROBNP 769.4*   Sepsis Labs:  Recent Labs Lab 03/22/14 0645 03/23/14 0525 03/24/14 0445 03/25/14 0400  WBC 1.2* 0.6* 0.5* 0.3*   Microbiology Recent Results (from the past 240 hour(s))  MRSA PCR Screening     Status: Abnormal   Collection Time: 03/16/14  2:42 AM  Result Value Ref Range Status   MRSA by PCR POSITIVE (A) NEGATIVE Final    Comment:        The GeneXpert MRSA Assay (FDA approved for NASAL specimens only), is one component of a comprehensive MRSA colonization surveillance program.  It is not intended to diagnose MRSA infection nor to guide or monitor treatment for MRSA infections. RESULT CALLED TO, READ BACK BY AND VERIFIED WITH: CALLED TO RN Donald Prose DOBSON [id number] @[phone]  THANEY  Body fluid culture     Status: None   Collection Time: 03/18/14  3:45 PM  Result Value Ref Range Status   Specimen Description PLEURAL  Final   Special Requests NONE  Final   Gram Stain   Final    RARE WBC PRESENT,BOTH PMN AND MONONUCLEAR NO ORGANISMS SEEN Performed at [facility]    Culture   Final    NO GROWTH 3 DAYS Performed at [facility]  Report Status 03/22/2014 FINAL  Final  Body fluid culture     Status: None (Preliminary result)   Collection Time: 03/24/14 11:40 AM  Result Value Ref Range Status   Specimen Description THORACENTESIS  Final   Special Requests Immunocompromised  Final   Gram Stain   Final    FEW WBC PRESENT, PREDOMINANTLY PMN NO ORGANISMS SEEN Performed at [facility]    Culture   Final    NO GROWTH 1 DAY Performed at [facility]    Report Status PENDING  Incomplete  Clostridium Difficile by PCR     Status: None   Collection Time: 03/24/14  3:36 PM  Result Value Ref Range Status   C difficile by pcr NEGATIVE NEGATIVE Final    Comment: Performed at [facility]     Medications:   . sodium chloride   Intravenous Once  . sodium chloride   Intravenous Once  . antiseptic oral rinse  7 mL Mouth Rinse q12n4p  . chlorhexidine  15 mL Mouth Rinse BID  . Chlorhexidine Gluconate Cloth  6 each Topical Q0600  . dronabinol  5 mg Oral BID AC  . filgrastim  480 mcg Subcutaneous Daily  . fluconazole (DIFLUCAN) IV  100 mg Intravenous Q24H  . folic acid  1 mg Oral Daily  . imipenem-cilastatin  250 mg Intravenous Q6H  . ipratropium-albuterol  3 mL Nebulization TID  . mupirocin ointment  1 application Nasal BID  . pantoprazole (PROTONIX) IV  40 mg Intravenous Q12H  . polyethylene glycol  17 g Oral Daily  .  potassium chloride  10 mEq Intravenous Q1 Hr x 6  . predniSONE  10 mg Oral Q breakfast  . protein supplement  1 scoop Oral BID WC  . scopolamine  1 patch Transdermal Q72H  . sodium chloride  3 mL Intravenous Q12H   Continuous Infusions: . dextrose 50 mL/hr at 03/24/14 1015    Time spent: 35 minutes.  The patient is critically ill and requires high complexity decision making.   LOS: 13 days   [place] [facility] Pager [phone]. If unable to reach me by pager, please call my cell phone at [phone].  *Please refer to [email or web address], password [email or web address] to get updated schedule on who will round on this patient, as hospitalists switch teams weekly. If 7PM-7AM, please contact night-coverage at [email or web address], password [email or web address] for any overnight needs.  03/25/2014, 10:01 AM

## 2014-03-25 NOTE — Progress Notes (Signed)
Inpatient Diabetes Program Recommendations  AACE/ADA: New Consensus Statement on Inpatient Glycemic Control (2013)  Target Ranges:  Prepandial:   less than 140 mg/dL      Peak postprandial:   less than 180 mg/dL (1-2 hours)      Critically ill patients:  140 - 180 mg/dL  Results for AIGNER, HORSEMAN (MRN [id number]) as of 03/25/2014 16:07  Ref. Range 03/25/2014 04:00  Glucose Latest Range: 70-99 mg/dL 281 (H)   Consider monitoring CBGs during steroid therapy.  Thank you  Raoul Pitch BSN, RN,CDE Inpatient Diabetes Coordinator [phone] (team pager)

## 2014-03-26 ENCOUNTER — Inpatient Hospital Stay (HOSPITAL_COMMUNITY): Payer: Medicare Other

## 2014-03-26 DIAGNOSIS — K1230 Oral mucositis (ulcerative), unspecified: Secondary | ICD-10-CM

## 2014-03-26 LAB — CBC WITH DIFFERENTIAL/PLATELET
BASOS ABS: 0 10*3/uL (ref 0.0–0.1)
BASOS PCT: 0 % (ref 0–1)
Band Neutrophils: 0 % (ref 0–10)
Blasts: 0 %
EOS PCT: 0 % (ref 0–5)
Eosinophils Absolute: 0 10*3/uL (ref 0.0–0.7)
HCT: 31 % — ABNORMAL LOW (ref 36.0–46.0)
HEMOGLOBIN: 10.4 g/dL — AB (ref 12.0–15.0)
LYMPHS ABS: 0.3 10*3/uL — AB (ref 0.7–4.0)
LYMPHS PCT: 97 % — AB (ref 12–46)
MCH: 29.8 pg (ref 26.0–34.0)
MCHC: 33.5 g/dL (ref 30.0–36.0)
MCV: 88.8 fL (ref 78.0–100.0)
Metamyelocytes Relative: 0 %
Monocytes Absolute: 0 10*3/uL — ABNORMAL LOW (ref 0.1–1.0)
Monocytes Relative: 0 % — ABNORMAL LOW (ref 3–12)
Myelocytes: 0 %
NEUTROS PCT: 3 % — AB (ref 43–77)
NRBC: 0 /100{WBCs}
Neutro Abs: 0 10*3/uL — ABNORMAL LOW (ref 1.7–7.7)
PLATELETS: 34 10*3/uL — AB (ref 150–400)
Promyelocytes Absolute: 0 %
RBC: 3.49 MIL/uL — AB (ref 3.87–5.11)
RDW: 15.3 % (ref 11.5–15.5)
WBC: 0.3 10*3/uL — CL (ref 4.0–10.5)

## 2014-03-26 LAB — COMPREHENSIVE METABOLIC PANEL
ALT: 21 U/L (ref 0–35)
ANION GAP: 11 (ref 5–15)
AST: 41 U/L — ABNORMAL HIGH (ref 0–37)
Albumin: 2 g/dL — ABNORMAL LOW (ref 3.5–5.2)
Alkaline Phosphatase: 100 U/L (ref 39–117)
BUN: 15 mg/dL (ref 6–23)
CALCIUM: 9.1 mg/dL (ref 8.4–10.5)
CO2: 31 mEq/L (ref 19–32)
Chloride: 98 mEq/L (ref 96–112)
Creatinine, Ser: 0.63 mg/dL (ref 0.50–1.10)
GFR calc non Af Amer: 89 mL/min — ABNORMAL LOW (ref >90)
GLUCOSE: 95 mg/dL (ref 70–99)
Potassium: 3 mEq/L — ABNORMAL LOW (ref 3.7–5.3)
Sodium: 140 mEq/L (ref 137–147)
TOTAL PROTEIN: 5.9 g/dL — AB (ref 6.0–8.3)
Total Bilirubin: 0.7 mg/dL (ref 0.3–1.2)

## 2014-03-26 LAB — MAGNESIUM: Magnesium: 1.7 mg/dL (ref 1.5–2.5)

## 2014-03-26 MED ORDER — FLUCONAZOLE IN SODIUM CHLORIDE 400-0.9 MG/200ML-% IV SOLN
400.0000 mg | INTRAVENOUS | Status: DC
  Administered 2014-03-26 – 2014-03-28 (×3): 400 mg via INTRAVENOUS
  Filled 2014-03-26 (×4): qty 200

## 2014-03-26 MED ORDER — CHLORHEXIDINE GLUCONATE 0.12 % MT SOLN
15.0000 mL | Freq: Four times a day (QID) | OROMUCOSAL | Status: DC
  Administered 2014-03-26 – 2014-03-28 (×8): 15 mL via OROMUCOSAL
  Filled 2014-03-26 (×7): qty 15

## 2014-03-26 MED ORDER — DEXTROSE 5 % IV SOLN
5.0000 mg/kg | Freq: Three times a day (TID) | INTRAVENOUS | Status: DC
  Filled 2014-03-26: qty 5.9

## 2014-03-26 MED ORDER — FUROSEMIDE 10 MG/ML IJ SOLN
40.0000 mg | Freq: Once | INTRAMUSCULAR | Status: AC
  Administered 2014-03-26: 40 mg via INTRAVENOUS

## 2014-03-26 MED ORDER — OSMOLITE 1.2 CAL PO LIQD
1000.0000 mL | ORAL | Status: DC
Start: 2014-03-26 — End: 2014-03-29
  Administered 2014-03-26 – 2014-03-27 (×2): 1000 mL
  Filled 2014-03-26 (×2): qty 1000

## 2014-03-26 MED ORDER — DEXTROSE 5 % IV SOLN
5.0000 mg/kg | Freq: Three times a day (TID) | INTRAVENOUS | Status: DC
  Administered 2014-03-26 – 2014-03-28 (×8): 295 mg via INTRAVENOUS
  Filled 2014-03-26 (×10): qty 5.9

## 2014-03-26 MED ORDER — KETOROLAC TROMETHAMINE 30 MG/ML IJ SOLN
30.0000 mg | Freq: Four times a day (QID) | INTRAMUSCULAR | Status: DC | PRN
  Administered 2014-03-26 – 2014-03-27 (×3): 30 mg via INTRAVENOUS
  Filled 2014-03-26 (×3): qty 1

## 2014-03-26 MED ORDER — CETYLPYRIDINIUM CHLORIDE 0.05 % MT LIQD
7.0000 mL | Freq: Four times a day (QID) | OROMUCOSAL | Status: DC
  Administered 2014-03-26 – 2014-03-27 (×6): 7 mL via OROMUCOSAL

## 2014-03-26 MED ORDER — ACYCLOVIR 400 MG PO TABS
400.0000 mg | ORAL_TABLET | Freq: Every day | ORAL | Status: DC
  Filled 2014-03-26 (×5): qty 1

## 2014-03-26 MED ORDER — POTASSIUM CHLORIDE 10 MEQ/100ML IV SOLN
10.0000 meq | INTRAVENOUS | Status: AC
  Administered 2014-03-26 (×3): 10 meq via INTRAVENOUS
  Filled 2014-03-26 (×3): qty 100

## 2014-03-26 MED ORDER — VANCOMYCIN HCL IN DEXTROSE 750-5 MG/150ML-% IV SOLN
750.0000 mg | Freq: Two times a day (BID) | INTRAVENOUS | Status: DC
  Administered 2014-03-26 – 2014-03-28 (×6): 750 mg via INTRAVENOUS
  Filled 2014-03-26 (×7): qty 150

## 2014-03-26 MED ORDER — SODIUM CHLORIDE 0.9 % IV SOLN
Freq: Once | INTRAVENOUS | Status: AC
  Administered 2014-03-26: 14:00:00 via INTRAVENOUS

## 2014-03-26 MED ORDER — ACETAMINOPHEN 650 MG RE SUPP
650.0000 mg | Freq: Four times a day (QID) | RECTAL | Status: DC | PRN
  Administered 2014-03-26 – 2014-03-27 (×2): 650 mg via RECTAL
  Filled 2014-03-26 (×2): qty 1

## 2014-03-26 NOTE — Progress Notes (Signed)
Clinical Social Work Department BRIEF PSYCHOSOCIAL ASSESSMENT 03/26/2014  Patient:  Debbie Howe, Debbie Howe     Account Number:  [id number]     Admit date:  03/07/2014  Clinical Social Worker:  Ulyess Blossom  Date/Time:  03/26/2014 01:30 PM  Referred by:  Physician  Date Referred:  03/26/2014 Referred for  SNF Placement   Other Referral:   Interview type:  Patient Other interview type:    PSYCHOSOCIAL DATA Living Status:  FAMILY Admitted from facility:   Level of care:   Primary support name:  Velva Harman Teague/sister/[phone] Primary support relationship to patient:  FAMILY Degree of support available:   adequate    CURRENT CONCERNS Current Concerns  Post-Acute Placement   Other Concerns:    SOCIAL WORK ASSESSMENT / PLAN CSW received referral for Advanced Directives.    CSW met with pt at bedside. CSW introduced self and explained role. CSW discussed reason for consult and pt shook her head "no" that she had not requested Advanced Directive. CSW expressed understanding and asked pt if she was okay for CSW to leave Advanced Directive packet as a resource. Pt agreeable.    CSW notified RN that Advanced Directive Packet provided at bedside, but pt did not wish to complete packet.    No further social work needs identified at this time.    CSW signing off.   Assessment/plan status:  No Further Intervention Required Other assessment/ plan:   Information/referral to community resources:   Ecologist provided at bedside    PATIENT'S/FAMILY'S RESPONSE TO PLAN OF CARE: Pt alert and oriented x 4. Pt communicated by nodding. Pt expressed that she had not asked about Advanced Directives, but agreeable to CSW leaving packet at bedside.    CSW signing off. Please re-consult if social work needs arise.    Alison Murray, MSW, [place] [facility] [phone]

## 2014-03-26 NOTE — Progress Notes (Signed)
Speech Language Pathology Treatment: Dysphagia  Patient Details Name: Meliana Canner MRN: [id number] DOB: 08/22/1945 Today's Date: 03/26/2014 Time: 0807-0829 SLP Time Calculation (min) (ACUTE ONLY): 22 min  Assessment / Plan / Recommendation Clinical Impression  Pt coughing on secretions at this time.  Oral care provided but as limited due to pt's oral sores/mucositis.  SLP used oral suction to attempt to clear posterior dried secretions - with pt assisting by helping to hold suction catheter.  Allowing pt participation decreases her reticence to oral care.  Note pt spiked temperature last evening.  Cues to cough to clear were not fully effective to clear secretions and was exhausting for pt.  Oral moisture via toothette, pt given water to swish and expectorate resulting in aspiration (c/b overt coughing).    Note plan for feeding tube, however SLP is concerned if pt may have pharyngeal infiltration of mucositis that may impact tolerance of tube and pt's swallow function.  As pt reportedly with weight loss over the last 1 1/2 months with poor intake - ? If she may benefit from consideration for long term nutritional support and this would bypass possible pharyngeal mucositis.    Recommend continue npo except sips/chips as tolerated only with strict precautions.  Continue to rec MBS when pt able to participate due to concerns for hoarseness, possible vagal nerve involvement from lung mass and secretion aspiration.  Will continue to follow for po readiness.  Educated pt/cousin to recommendations using teach back.  Spoke to RN regarding concerns.       HPI HPI: 68 year old female admitted 03/05/2014 due to SOB. PMH significant for RA. CT revealed hilar and suprahilar masses with mets to the liver and spine. BSE ordered to evaluate swallow function and safety. Pt with pna per CXR.  BSE completed yesterday with recommendations for NPO except ice.  Follow up to determine readiness for po intake.      Pertinent Vitals Pain Score: 9  Pain Location: head Pain Intervention(s): Patient requesting pain meds-RN notified (rn made aware)  SLP Plan  Continue with current plan of care    Recommendations Medication Administration: Via alternative means              Oral Care Recommendations: Oral care Q4 per protocol Follow up Recommendations:  (tbd) Plan: Continue with current plan of care    [place], [place] [facility] SLP [phone]

## 2014-03-26 NOTE — Progress Notes (Signed)
Patient ID: Debbie Howe, female   DOB: 1945/11/10, 68 y.o.   MRN: [id number] [facility] PROGRESS NOTE  Debbie Howe INO:[id number] DOB: 1946-01-02 DOA: 02/22/2014 PCP: Charletta Cousin, MD  Brief narrative: 68 y.o. female with a PMH of rheumatoid arthritis, depression who was admitted to [facility] 02/26/2014 with shortness of breath. 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 Patient was transferred to [facility] 10/3/12015 and has completed her first course of chemotherapy. Barrier to discharge is her ongoing respiratory failure, weakness, and pancytopenia.  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 No evidence of brain metastasis. - Palliative care team 03/17/14 continues to follow. Patient remains a full code at this time. - Dr. Marin Olp following, status post first cycle of chemotherapy. No evidence of tumor lysis syndrome.  - Therapeutic/diagnostic thoracentesis done 03/18/14, 600 cc pleural fluid removed, pleural fluid cultures negative. Repeat thoracentesis done 03/24/14 with 750 mL fluid removed. May need Pleurx catheter. This morning chest x-ray showed  Small right pleural effusion. We'll continue to monitor. - Continue current nebulizer treatment, dual neb 3 times daily scheduled and every 2 hours as needed for shortness of breath or wheezing -  Patient was given Lasix x 2 03/23/14 due to increased chest congestion and concerns for pulmonary edema. As mentioned, respiratory status is guarded but stable.  Active Problems:  MRSA carrier - Continue decontamination therapy.   Cancer related pain - appreciate palliative care team following and their recommendations   Hypernatremia - likely secondary to dehydration. Sodium is now within normal limits. Resolved with IV fluids.   Acute kidney injury - Creatinine normalized with increased IV fluids.   Sepsis secondary to community-acquired postobstructive pneumonia - Sepsis criteria met on admission with fever, tachycardia, leukocytosis and probable postobstructive pneumonia. - Treated with vancomycin and Zosyn. Vancomycin subsequently discontinued 03/17/14. - Pleural fluid cultures negative.   Cancer related pain/abdominal pain/knee pain - Management per palliative care recommendations.   Nausea without vomiting - Continue anti-emetics as needed. - per oncology recommendations, plan for NG tube placement   Loss of appetite / severe protein calorie malnutrition - Dietitian consulted. Continue Marinol and nutritional supplements. - Swallowing evaluation requested secondary to concerns for ability to protect airway. Deferred 03/23/14 secondary to increased oxygen requirement, increased HR/RR and congestion. NPO for now except for some meds if pt able to swallow.  - placement of NG tube per oncology recommendations       Mucositis - likely secondary to side effects of chemotherapy - lots of mouth ulcers. Continue acyclovir, fluconazole,mouthwashes - patient is on low-dose prednisone for rheumatoid arthritis and is on PPI therapy which may help with ulcers as well.   Depression - Continue Elavil.   Hypokalemia / hypomagnesemia - likely secondary to GI losses. - Given 6 runs IV potassium and 2 g IV magnesium.   Rheumatoid arthritis - Continue prednisone.  Methotrexate on hold.   Anemia of chronic disease/thrombocytopenia/leukopenia / pancytopenia - pancytopenia likely secondary to bone marrow suppression from  malignancy and sequela of chemotherapy - Monitor counts closely and transfuse as needed. - Continue Neupogen. ANC is zero. - Status post a total of 5 units of PRBCs and 2 units of platelets. Hgb and platelet count stable post transfusion.   Acute encephalopathy - Likely secondary to side effects of anxiolytics. Resolved.   DVT Prophylaxis - Continue SCDs.  Code Status: Full. Family Communication: [name] (significant other). Niece updated at the bedside today. Disposition Plan: patient remains in step down unit.   IV Access:    Right upper extremity PICC line placed 03/18/14   Procedures and diagnostic studies:   Bronchoscopy 02/21/2014 - Cytology brushings from RML and RUL; EBBx from RUL bronchus and a nodule from the R mainstem; TBNA targeting pretracheal and subtracheal lymph nodes; All specimens sent for cytology and surg path.  Ct Abdomen Pelvis W Contrast 03/17/2014 1. Progressive enlargement of right pleural effusion with visible new atelectasis and collapse of the right lower lung. Correlation with chest x-ray a may be helpful as this may now represent a largely drowned right lung. 2. New small left pleural effusion. 3. 3 right hepatic lesions are identified, likely representing metastatic disease to the liver. The largest measures 1.5 cm. No other evidence of metastatic disease in the abdomen or pelvis.   Dg Chest Port 1 View 03/17/2014 Complete opacification of the right hemithorax due to a combination of right effusion and right upper lobe consolidation with right upper lobe mass as noted on recent CT. Suspect a combination of interstitial edema and redistribution of blood flow the left lung. No airspace consolidation on the left.   Ir Thoracentesis Asp Pleural Space W/img Guide 03/18/2014: Successful  ultrasound guided right thoracentesis yielding 600 cc of pleural fluid. Electronically Signed By: Maryclare Bean M.D. On: 03/18/2014 16:21   Dg chest 1 view 03/18/14: 1. Grossly unchanged near complete opacification of the right lung secondary to unchanged moderate-sized effusion and extensive right lung consolidative opacities / drowned lung. No evidence of pneumothorax. 2. Right upper extremity approach PICC line tip projects over the superior cavoatrial junction.  Dg Chest Port 1 View 03/21/2014: 1. Again noted is complete opacification of the right hemithorax with volume loss on the right. The right mainstem bronchus is poorly visualized and may be occluded by previously identified right hilar mass as noted on prior CT of 02/15/2014. Associated right pleural effusion may be present. 2. Mild left base subsegmental atelectasis and or infiltrate.   Dg Chest Port 1 View 03/22/2014: No change. Complete opacification of the right hemi thorax without mediastinal shift. Mild patchy density at the left lung base.   Dg Chest Port 1 View 03/23/2014: No change from the earlier study.   Dg Chest Port 1 View 03/23/2014: No significant interval change in the appearance of the chest. Persistent whiteout of the right hemi thorax with probable combination of pleural effusion and postobstructive collapse.   Thoracentesis 03/24/14: 750 mL bloody fluid removed.  Dg Chest Port 1 View 03/24/2014: 1. No pneumothorax following right thoracentesis. 2. Significantly decreased right pleural fluid. 3. Large right upper lobe mass. 4. Changes of COPD and chronic bronchitis with pulmonary vascular congestion and mild interstitial pulmonary edema.   Dg Chest Port 1 View 03/25/2014 1. PICC line stable position. 2. Large right upper lobe mass, unchanged. 3. Small right pleural effusion.   Medical Consultants:    Oncology (Dr. Burney Gauze)  Interventional Radiology (Dr. Barbie Banner)  Palliative care (Dr. Timmie Foerster)  Anti-Infectives:    Zosyn 02/16/2014  Vancomycin 02/15/2014 --> 03/17/2014  Leisa Lenz, MD  [facility] Pager [phone]  If 7PM-7AM, please contact night-coverage [email or web address] Password [email or web address] 03/26/2014, 8:29 AM   LOS: 14 days    HPI/Subjective: No acute overnight events.  Objective: Filed Vitals:   03/26/14 0500 03/26/14 0600 03/26/14 0700 03/26/14 0800  BP: 154/83 125/96 131/77 137/56  Pulse: 119 119 122 116  Temp:    99.1 F (37.3 C)  TempSrc:    Oral  Resp: 26 35 26 31  Height:      Weight:      SpO2: 90% 93% 90% 97%    Intake/Output Summary (Last 24 hours) at 03/26/14 0829 Last data filed at 03/26/14 0800  Gross per 24 hour  Intake   2530 ml  Output   2225 ml  Net    305 ml    Exam:   General:  Pt is alert, follows commands appropriately, not in acute distress; lots of mouth ulcers, mucositis  Cardiovascular: tachycardic, S1/S2 appreciated    Respiratory: rhonchorous, congested  Abdomen: Soft, non tender, non distended, bowel sounds present  Extremities: No edema, pulses DP and PT palpable bilaterally  Neuro: Grossly nonfocal  Data Reviewed: Basic Metabolic Panel:  Recent Labs Lab 03/21/14 0452 03/22/14 0645 03/23/14 0525 03/24/14 0445 03/25/14 0400 03/26/14 0419  NA 145 144 153* 153* 142 140  K 3.5* 3.9 3.5* 2.9* 2.6* 3.0*  CL 103 103 111 104 98 98  CO2 _0 34* 33* 31  GLUCOSE 119* 100* 91 100* 281* 95  BUN 49* 41* 32* 29* 21 15  CREATININE 1.58* 1.32* 0.97 0.88 0.61 0.63  CALCIUM 9.4 8.8 9.3 9.4 9.1 9.1  MG 2.1  --   --  1.6 1.4* 1.7  PHOS 5.4* 3.7 3.6 3.8 2.4  --    Liver Function Tests:  Recent Labs Lab 03/22/14 0645 03/23/14 0525 03/24/14 0445 03/25/14 0400 03/26/14 0419  AST 102* 68* 56* 41* 41*  ALT 56* 44* 34 24 21  ALKPHOS 151* 123* 116 97 100  BILITOT 0.4 0.3 0.6 0.4 0.7  PROT 5.5* 5.3* 6.0 5.6* 5.9*  ALBUMIN 1.9* 1.7* 2.1* 1.9* 2.0*   No results for input(s): LIPASE, AMYLASE  in the last 168 hours. No results for input(s): AMMONIA in the last 168 hours. CBC:  Recent Labs Lab 03/22/14 0645 03/23/14 0525 03/24/14 0445 03/25/14 0400 03/26/14 0419  WBC 1.2* 0.6* 0.5* 0.3* 0.3*  NEUTROABS  --  0.2* 0.0* 0.0* 0.0*  HGB 8.8* 8.3* 10.6* 9.8* 10.4*  HCT 26.4* 25.4* 31.7* 29.6* 31.0*  MCV 91.7 93.0 89.5 89.7 88.8  PLT 23* 12* 91* 53* 34*   Cardiac Enzymes: No results for input(s): CKTOTAL, CKMB, CKMBINDEX, TROPONINI in the last 168 hours. BNP: Invalid input(s): POCBNP CBG: No results for input(s): GLUCAP in the last 168 hours.  Body fluid culture     Status: None   Collection Time: 03/18/14  3:45 PM  Result Value Ref Range Status   Specimen Description PLEURAL  Final   Special Requests NONE  Final   Gram Stain   Final    RARE WBC PRESENT,BOTH PMN AND MONONUCLEAR NO ORGANISMS SEEN   Culture   Final    NO GROWTH 3 DAYS   Report Status 03/22/2014 FINAL  Final  Body fluid culture     Status: None (Preliminary result)   Collection Time: 03/24/14 11:40 AM  Result Value Ref Range Status   Specimen Description THORACENTESIS  Final   Special Requests  Immunocompromis  Final   Gram Stain   Final    FEW WBC PRESENT, PREDOMINANTLY PMN NO ORGANISMS SEEN   Culture   Final    NO GROWTH 1 DAY   Report Status PENDING  Incomplete  Clostridium Difficile by PCR     Status: None   Collection Time: 03/24/14  3:36 PM  Result Value Ref Range Status   C difficile by pcr NEGATIVE NEGATIVE Final    Comment: Performed at [facility]     Scheduled Meds: . acyclovir  400 mg Oral 5 X Daily  . dronabinol  5 mg Oral BID AC  . filgrastim  480 mcg Subcutaneous Daily  . fluconazole   400 mg Intravenous Q24H  . folic acid  1 mg Oral Daily  . imipenem-cilastatin  250 mg Intravenous Q6H  . ipratropium-albuterol  3 mL Nebulization TID  . mupirocin ointment  1 application Nasal BID  . pantoprazole (PROTONIX) IV  40 mg Intravenous Q12H  . polyethylene glycol  17 g Oral  Daily  . potassium chloride  10 mEq Intravenous Q1 Hr x 4  . predniSONE  10 mg Oral Q breakfast  . protein supplement  1 scoop Oral BID WC  . scopolamine  1 patch Transdermal Q72H  . vancomycin  750 mg Intravenous Q12H   Continuous Infusions: . dextrose 50 mL/hr at 03/25/14 1617

## 2014-03-26 NOTE — Progress Notes (Signed)
NUTRITION FOLLOW UP  Intervention:   -Once TF access obtained (panda tube vs PEG):  -Initiate Osmolite 1.2 at 20 ml/hr, increase 10 ml every 4 hours to goal of 60 ml/hr to provide:  1728 kcal, 80 gm protein, 1181 ml free water. -Monitor refeeding labs upon initiation of TF as pt with prolonged period of sub-optimal nutrition -Discontinued PO nutrition supplements -RD to continue to monitor  Nutrition Dx:   Inadequate oral intake related to poor appetite, illness for the last 2 months as evidenced by Meal Completion: <25%; ongoing   Goal:   Pt to meet >/= 90% of their estimated nutrition needs; not met  Monitor:   Diet advancement, Supplement tolerance, total protein/energy intake, labs, weights  Assessment:   10/28: Pt with hx of RA, admitted with SOB, pt has extensive 50 year smoking hx. Per CT pt has a large right hilar mass which is concerning for lung ca. Pulmonology consulted for biopsy.  Pt with a lot of family in the room. Pt lives with her husband and son. Per family pt has been eating very poorly for the last 1 1/2 - 2 months. 24 hr recall: Breakfast: cereal or an egg, Lunch: nothing, Dinner: bites. Pt has had a poor appetite  Pt has not tried any PO supplements before but they feel she would like chocolate ensure.  Family very concerned about pt. Pt very sleepy but states she has probably lost 25 lb. Could not confirm this as she also states she usually weighs 138   11/04: -Pt continues with poor PO intake,, 0-25% -Has not been drinking Ensure, will modify to PRN -Family has been bringing in outside food sources to encourage PO intake - chocolate milkshake, strawberries, etc -Pt reported she enjoys most foods; however has early satiety. Recommend pt trial Beneprotein protein supplement to increase nutrient density of foods pt is able to consume -Was willing to trial MagicCup as pt enjoys sweets, as well as 8PM snack of chocolate chip cookies and whole milk -Received first  chemo on 11/02; pt noted to have constipation. Was given stool softener; which will likely assist in early satiety -Pt to start on Marinol BID on 11/04  11/9: -Patient with severe mucositis. -SLP has been following.  Recommends NPO currently.  Possible MBS if patient wakes up.  Wet gurgley breathing noted with probably aspiration of secretions.   -Diarrhea with question of c.diff. -Off oxygen.  Family states she looks much better than yesterday. -Possible feeding tube per oncologist if unable to eat. -Family reports patient likes Ensure but has not been able to drink/eat due to mucositis.  11/10: -SLP evaluated on 11/10, recommended to continue with NPO status and MBS. Expressed concern with placement of feeding tube and pt's current oral ulcerations/mucositis -Per discussion with RN, RN will attempt to place panda tube this afternoon.  -Discussed feeding tube placement with pt's family. Noted that it may be difficult to place NGT or panda; and pt may need to have long term nutritional support access, such as PEG. Family expressed understanding of both options.  -Due to pt's current lethargic state, family would likely have to sign consent for PEG placement as warranted -K low- being repleted; Phos/Mg WNL; continue to monitor   Height: Ht Readings from Last 1 Encounters:  02/15/2014 5' 2"  (1.575 m)    Weight Status:   Wt Readings from Last 1 Encounters:  03/26/14 129 lb 13.6 oz (58.9 kg)    Re-estimated needs:  Kcal: 1600-1800 Protein: 75-90 grams  Fluid: >1.6 L/day  Skin: ecchymosis  Diet Order: Diet NPO time specified   Intake/Output Summary (Last 24 hours) at 03/26/14 1132 Last data filed at 03/26/14 1100  Gross per 24 hour  Intake   2480 ml  Output   1865 ml  Net    615 ml    Labs:   Recent Labs Lab 03/23/14 0525 03/24/14 0445 03/25/14 0400 03/26/14 0419  NA 153* 153* 142 140  K 3.5* 2.9* 2.6* 3.0*  CL 111 104 98 98  CO2 30 34* 33* 31  BUN 32* 29* 21 15   CREATININE 0.97 0.88 0.61 0.63  CALCIUM 9.3 9.4 9.1 9.1  MG  --  1.6 1.4* 1.7  PHOS 3.6 3.8 2.4  --   GLUCOSE 91 100* 281* 95    CBG (last 3)  No results for input(s): GLUCAP in the last 72 hours.  Scheduled Meds: . sodium chloride   Intravenous Once  . sodium chloride   Intravenous Once  . sodium chloride   Intravenous Once  . acyclovir  5 mg/kg Intravenous 3 times per day  . antiseptic oral rinse  7 mL Mouth Rinse QID  . chlorhexidine  15 mL Mouth Rinse QID  . Chlorhexidine Gluconate Cloth  6 each Topical Q0600  . dronabinol  5 mg Oral BID AC  . filgrastim  480 mcg Subcutaneous Daily  . fluconazole (DIFLUCAN) IV  400 mg Intravenous Q24H  . folic acid  1 mg Oral Daily  . imipenem-cilastatin  250 mg Intravenous Q6H  . ipratropium-albuterol  3 mL Nebulization TID  . mupirocin ointment  1 application Nasal BID  . pantoprazole (PROTONIX) IV  40 mg Intravenous Q12H  . polyethylene glycol  17 g Oral Daily  . predniSONE  10 mg Oral Q breakfast  . protein supplement  1 scoop Oral BID WC  . scopolamine  1 patch Transdermal Q72H  . sodium chloride  3 mL Intravenous Q12H  . vancomycin  750 mg Intravenous Q12H    Continuous Infusions: . dextrose 50 mL/hr at 03/25/14 [place] LDN Clinical Dietitian [id number]

## 2014-03-26 NOTE — Progress Notes (Addendum)
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 Ativan [Lorazepam] Other (See Comments)    Altered mental status after ativan 1 mg iv    Patient Measurements: Height: 5\' 2"  (157.5 cm) Weight: 129 lb 13.6 oz (58.9 kg) IBW/kg (Calculated) : 50.1  Vital Signs: Temp: 103 F (39.4 C) (11/10 0400) Temp Source: Oral (11/10 0400) BP: 131/77 mmHg (11/10 0700) Pulse Rate: 122 (11/10 0700) Intake/Output from previous day: 11/09 0701 - 11/10 0700 In: 2280 [I.V.:1330; IV Piggyback:950] Out: 2450 [Urine:2450] Intake/Output from this shift:    Labs:  Recent Labs  03/24/14 0445 03/25/14 0400 03/26/14 0419  WBC 0.5* 0.3* 0.3*  HGB 10.6* 9.8* 10.4*  PLT 91* 53* 34*  CREATININE 0.88 0.61 0.63   Estimated Creatinine Clearance: 51.8 mL/min (by C-G formula based on Cr of 0.63). No results for input(s): VANCOTROUGH, VANCOPEAK, VANCORANDOM, GENTTROUGH, GENTPEAK, GENTRANDOM, TOBRATROUGH, TOBRAPEAK, TOBRARND, AMIKACINPEAK, AMIKACINTROU, AMIKACIN in the last 72 hours.   Microbiology: Recent Results (from the past 720 hour(s))  Urine culture     Status: None   Collection Time: 03/03/2014  2:01 PM  Result Value Ref Range Status   Specimen Description URINE, CLEAN CATCH  Final   Special Requests NONE  Final   Culture  Setup Time   Final    02/28/2014 21:10 Performed at [place]   Final    >=100,000 COLONIES/ML Performed at [facility]   Culture   Final    Multiple bacterial morphotypes present, none predominant. Suggest appropriate recollection if clinically indicated. Performed at [facility]   Report Status 03/13/2014 FINAL  Final  Blood culture (routine x 2)     Status: None   Collection Time: 03/05/2014  2:20 PM  Result Value Ref Range Status   Specimen Description BLOOD RIGHT ANTECUBITAL  Final   Special Requests  BOTTLES DRAWN AEROBIC AND ANAEROBIC 5MLS  Final   Culture  Setup Time   Final    02/23/2014 20:52 Performed at [facility]    Culture   Final    NO GROWTH 5 DAYS Performed at [facility]    Report Status 03/18/2014 FINAL  Final  Blood culture (routine x 2)     Status: None   Collection Time: 03/06/2014  3:15 PM  Result Value Ref Range Status   Specimen Description BLOOD RIGHT ANTECUBITAL  Final   Special Requests BOTTLES DRAWN AEROBIC AND ANAEROBIC 10MLS  Final   Culture  Setup Time   Final    03/05/2014 20:53 Performed at [facility]    Culture   Final    NO GROWTH 5 DAYS Performed at [facility]    Report Status 03/18/2014 FINAL  Final  MRSA PCR Screening     Status: Abnormal   Collection Time: 03/16/14  2:42 AM  Result Value Ref Range Status   MRSA by PCR POSITIVE (A) NEGATIVE Final    Comment:        The GeneXpert MRSA Assay (FDA approved for NASAL specimens only), is one component of a comprehensive MRSA colonization surveillance program. It is not intended to diagnose MRSA infection nor to guide or monitor treatment for MRSA infections. RESULT CALLED TO, READ BACK BY AND VERIFIED WITH: CALLED TO RN LETICIA DOBSON [id number] @[phone]  THANEY  Body fluid culture     Status: None   Collection Time: 03/18/14  3:45 PM  Result Value Ref Range Status   Specimen Description PLEURAL  Final   Special Requests NONE  Final   Gram Stain   Final    RARE WBC PRESENT,BOTH PMN AND MONONUCLEAR NO ORGANISMS SEEN Performed at [facility]    Culture   Final    NO GROWTH 3 DAYS Performed at [facility]    Report Status 03/22/2014 FINAL  Final  Body fluid culture     Status: None (Preliminary result)   Collection Time: 03/24/14 11:40 AM  Result Value Ref Range Status   Specimen Description THORACENTESIS  Final   Special Requests Immunocompromised  Final   Gram Stain   Final    FEW WBC PRESENT, PREDOMINANTLY PMN NO ORGANISMS  SEEN Performed at [facility]    Culture   Final    NO GROWTH 1 DAY Performed at [facility]    Report Status PENDING  Incomplete  Clostridium Difficile by PCR     Status: None   Collection Time: 03/24/14  3:36 PM  Result Value Ref Range Status   C difficile by pcr NEGATIVE NEGATIVE Final    Comment: Performed at [facility] History: Past Medical History  Diagnosis Date  . Rheumatoid arthritis   . 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 Protein-calorie malnutrition, severe 03/19/2014  . Small cell carcinoma   . 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 S/p chemo 11/2 - 11/4 and since has had ongoing respiratory failure, weakness and pancytopenia. She was previously treated with Zosyn and Vancomycin upon admission for sepsis secondary to community acquired postobstructive pneumonia. Now presenting with mucositis with possible viral ulcerations, neutropenic, and increased fevers. Pharmacy consulted to dose acyclovir and vancomycin.  10/27 azith/ctx x 1 10/27 >> Zosyn >> 11/7  10/27 >> Vanc >> 10/31 11/2 >> fluconazole (MD) >> 11/17 11/6 >> imipenem (MD) >> 11/10 >> Vanco >> 11/10 >> acyclovir >>  Tmax: 103 WBC: 0.3 Renal: SCr 0.63 CrCl~59ml/min (improved)  10/31 MRSA screen (+) 10/27 urine cx: >100K cfu/ml multiple species 10/27 blood x 2: ng F 11/2 R pleural fluid: NG F 11/8 c. Diff: negative 11/8 fluid culture (thoracentesis): ngtd  Goal of Therapy:  Vancomycin trough level 15-20 mcg/ml  Appropriate antibiotic dosing for renal function; eradication of infection  Plan:  Start acyclovir 400mg  PO 5 times a day for 7 days Start Vancomycin 750mg  IV Q12H Measure antibiotic drug levels at steady state Follow up culture results  Kizzie Furnish, PharmD Pager: [phone] 03/26/2014 7:56 AM  Addendum: Ophelia Shoulder Acyclovir from PO to IV formulation: Start Acyclovir 5mg /kg (295mg ) Q8H  x 7 days  Kizzie Furnish, PharmD Pager: [phone] 03/26/2014 10:21 AM

## 2014-03-26 NOTE — Progress Notes (Signed)
Mrs. Whiteman really has bad mucositis. It looks like she may have viral ulcerations. Her white cell count is still 0.3. She is on Neupogen.  I will start acyclovir on her. We will get pharmacy to help [place] with dosing.  She clearly needs nutrition. She needs a feeding tube put in. I think in NG feeding tube would be appropriate. I think that once her neutropenia resolves, her mouth will improve. This may take several more days. We have to get nutrition into her so that she will recover more quickly.  Her platelet count was 34,000. I want to give her a dose of platelets before the feeding tube gets placed.  I feel that the treatment is working. Her right lung is opening up now. She has stayed off facemask oxygen and is on nasal cannula. Her oxygen saturation is in the 90s. She is still somewhat tachycardic.  Her C. Difficile is negative. She has not had problems with diarrhea.  Her temperature has been up to 103  We will have to get cultures on her.she is on Primaxin. We may need to add vancomycin.  On her physical exam, she does sound better with her lungs. I don't hear as much rhonchi. She has good air movement on the left side. There is decent air movement on the right side.  Her cousin was with her. Her cousin is actually a former Midwife at the hospital here. She certainly understood what I was recommending with respect to the feeding tube.  Again, we have to support her through the pancytopenia. Her chemotherapy is working. Unfortunately, even with a reduced dose, it is really taken a toll on her. The mucositis is pretty significant. We'll have to see about some type of oral care regimen.  Again I will add vancomycin to the Primaxin. We will see what her cultures show.  We still have a long way to go. I think once her white cell count starts recovering, then she will begin to improve.  Lum Keas  Lamentations 3:32-33

## 2014-03-27 ENCOUNTER — Inpatient Hospital Stay (HOSPITAL_COMMUNITY): Payer: Medicare Other

## 2014-03-27 ENCOUNTER — Telehealth: Payer: Self-pay

## 2014-03-27 ENCOUNTER — Encounter (HOSPITAL_COMMUNITY): Payer: Self-pay | Admitting: Pulmonary Disease

## 2014-03-27 DIAGNOSIS — J9601 Acute respiratory failure with hypoxia: Secondary | ICD-10-CM

## 2014-03-27 LAB — CBC WITH DIFFERENTIAL/PLATELET
BASOS ABS: 0 10*3/uL (ref 0.0–0.1)
Basophils Relative: 0 % (ref 0–1)
Eosinophils Absolute: 0 10*3/uL (ref 0.0–0.7)
Eosinophils Relative: 0 % (ref 0–5)
HEMATOCRIT: 27.4 % — AB (ref 36.0–46.0)
Hemoglobin: 9.3 g/dL — ABNORMAL LOW (ref 12.0–15.0)
Lymphocytes Relative: 96 % — ABNORMAL HIGH (ref 12–46)
Lymphs Abs: 0.2 10*3/uL — ABNORMAL LOW (ref 0.7–4.0)
MCH: 29.8 pg (ref 26.0–34.0)
MCHC: 33.9 g/dL (ref 30.0–36.0)
MCV: 87.8 fL (ref 78.0–100.0)
MONOS PCT: 0 % — AB (ref 3–12)
Monocytes Absolute: 0 10*3/uL — ABNORMAL LOW (ref 0.1–1.0)
NEUTROS ABS: 0 10*3/uL — AB (ref 1.7–7.7)
Neutrophils Relative %: 4 % — ABNORMAL LOW (ref 43–77)
PLATELETS: 93 10*3/uL — AB (ref 150–400)
RBC: 3.12 MIL/uL — ABNORMAL LOW (ref 3.87–5.11)
RDW: 14.9 % (ref 11.5–15.5)
WBC: 0.2 10*3/uL — AB (ref 4.0–10.5)

## 2014-03-27 LAB — COMPREHENSIVE METABOLIC PANEL
ALBUMIN: 2 g/dL — AB (ref 3.5–5.2)
ALK PHOS: 109 U/L (ref 39–117)
ALT: 16 U/L (ref 0–35)
AST: 32 U/L (ref 0–37)
Anion gap: 12 (ref 5–15)
BILIRUBIN TOTAL: 0.7 mg/dL (ref 0.3–1.2)
BUN: 14 mg/dL (ref 6–23)
CHLORIDE: 93 meq/L — AB (ref 96–112)
CO2: 31 mEq/L (ref 19–32)
Calcium: 8.3 mg/dL — ABNORMAL LOW (ref 8.4–10.5)
Creatinine, Ser: 0.58 mg/dL (ref 0.50–1.10)
GFR calc Af Amer: >90 mL/min (ref >90)
GFR calc non Af Amer: >90 mL/min (ref >90)
Glucose, Bld: 94 mg/dL (ref 70–99)
Potassium: <2.2 mEq/L — CL (ref 3.7–5.3)
SODIUM: 136 meq/L — AB (ref 137–147)
TOTAL PROTEIN: 5.7 g/dL — AB (ref 6.0–8.3)

## 2014-03-27 LAB — URINE CULTURE
Colony Count: NO GROWTH
Culture: NO GROWTH

## 2014-03-27 LAB — BODY FLUID CULTURE: Culture: NO GROWTH

## 2014-03-27 LAB — POTASSIUM: Potassium: >7.7 mEq/L (ref 3.7–5.3)

## 2014-03-27 LAB — PREPARE PLATELET PHERESIS: Unit division: 0

## 2014-03-27 MED ORDER — LIDOCAINE HCL 2 % EX GEL
1.0000 "application " | Freq: Once | CUTANEOUS | Status: AC
  Administered 2014-03-27: 1
  Filled 2014-03-27: qty 5

## 2014-03-27 MED ORDER — ONDANSETRON HCL 4 MG/2ML IJ SOLN
4.0000 mg | Freq: Four times a day (QID) | INTRAMUSCULAR | Status: DC | PRN
  Administered 2014-03-27: 4 mg via INTRAVENOUS

## 2014-03-27 MED ORDER — POTASSIUM CHLORIDE 10 MEQ/100ML IV SOLN
10.0000 meq | INTRAVENOUS | Status: AC
  Administered 2014-03-27 (×6): 10 meq via INTRAVENOUS
  Filled 2014-03-27 (×6): qty 100

## 2014-03-27 MED ORDER — IPRATROPIUM-ALBUTEROL 0.5-2.5 (3) MG/3ML IN SOLN
3.0000 mL | Freq: Four times a day (QID) | RESPIRATORY_TRACT | Status: DC
  Administered 2014-03-27 – 2014-03-28 (×4): 3 mL via RESPIRATORY_TRACT
  Filled 2014-03-27 (×5): qty 3

## 2014-03-27 MED ORDER — ONDANSETRON HCL 4 MG/2ML IJ SOLN: 4.0000 mg | Freq: Four times a day (QID) | INTRAMUSCULAR | Status: DC | PRN

## 2014-03-27 MED ORDER — ONDANSETRON HCL 4 MG/2ML IJ SOLN
INTRAMUSCULAR | Status: AC
  Filled 2014-03-27: qty 2

## 2014-03-27 MED ORDER — POTASSIUM CHLORIDE 20 MEQ/15ML (10%) PO SOLN: 40.0000 meq | Freq: Once | ORAL | Status: DC

## 2014-03-27 MED ORDER — ALBUTEROL SULFATE (2.5 MG/3ML) 0.083% IN NEBU: 2.5000 mg | INHALATION_SOLUTION | RESPIRATORY_TRACT | Status: DC | PRN

## 2014-03-27 MED ORDER — MORPHINE SULFATE 2 MG/ML IJ SOLN
1.0000 mg | INTRAMUSCULAR | Status: DC | PRN
  Administered 2014-03-28 (×4): 1 mg via INTRAVENOUS
  Filled 2014-03-27 (×4): qty 1

## 2014-03-27 MED ORDER — FILGRASTIM 300 MCG/ML IJ SOLN
600.0000 ug | Freq: Every day | INTRAMUSCULAR | Status: DC
  Administered 2014-03-28: 600 ug via SUBCUTANEOUS
  Filled 2014-03-27 (×2): qty 3.2

## 2014-03-27 MED ORDER — METOPROLOL TARTRATE 1 MG/ML IV SOLN
INTRAVENOUS | Status: AC
  Administered 2014-03-27: 5 mg
  Filled 2014-03-27: qty 5

## 2014-03-27 MED ORDER — POTASSIUM CHLORIDE 10 MEQ/100ML IV SOLN
INTRAVENOUS | Status: AC
  Filled 2014-03-27: qty 100

## 2014-03-27 NOTE — Consult Note (Signed)
PULMONARY / CRITICAL CARE MEDICINE   Name: Debbie Howe MRN: [id number] DOB: 02/05/1946    ADMISSION DATE:  03/11/2014 CONSULTATION DATE:  03/27/14  REFERRING MD :  Dr. Charlies Silvers   CHIEF COMPLAINT:  SOB   INITIAL PRESENTATION: 68 y/o F with a PMH of rheumatoid arthritis & depression admitted 10/27 with SOB. Work up found a large R hilar and suprahilar mass with extensive LAN.  FOB with bx consistent with small cell lung cancer.  MRI with cervical, lumbar metastases but no brain involvement.    STUDIES:  10/27  CT Chest >> lg R hilar, suprahilar mass with extensive adenopathy, small R pleural effusion, post-ob pneumonitis in RUL C7 cervical rib fx's 10/30  MRI Brain >> widespread osseous metastatic disease including upper cervical spine & calvarium 10/30  MRI Lumbar Spine >> widespread metastatic disease to lumbar spine and pelvis 10/31  CT Abd/Pelvis >> progressive enlargement of R pleural effusion, new atx RLL, new small L effusion, R hepatic lesions (likely mets to liver)   SIGNIFICANT EVENTS: 10/27  Admit to [facility] with SOB.  CT Chest with a large R hilar and suprahilar mass with extensive LAN. 10/29  FOB w Bx > small cell lung CA 11/01  Met with Palliative Care Team, family desires a full code 1102  Therapeutic thoracentesis 600 ml removed 11/03  Completed first course of chemotherapy 11/08  Repeat Thora, 750 ml removed  11/11  Decline in respiratory status, PCCM consulted for evaluation   HISTORY OF PRESENT ILLNESS: 68 y/o F, former 16 year smoker, with a PMH of rheumatoid arthritis & depression who was admitted on 10/27 with a three week history of weakness & SOB. Patient was found to have fever of 101, WBC 13.9, and CXR with RUL airspace disease.  She was admitted for CAP by Internal Medicine. Subsequent CT of the Chest found a large R hilar, suprahilar mass with extensive adenopathy, small R pleural effusion, post-ob pneumonitis in RUL C7 cervical rib fx's.  She further underwent a  FOB with biopsy on 10/29 with pathology consistent with small cell lung cancer.  Follow up staging MRI was positive for cervical, lumbar, pelvic and probable liver metastases but no brain involvement.  She was transferred to [facility] for chemotherapy initiation.  She completed her first cycle of chemotherapy on 11/3.  Unfortunately, the patient has had a complicated course with pancytopenia, pain control issues, and dyspnea.  She has undergone 2 thoracentesis with some improvement in respiratory symptoms.  She remains on 6L oxygen.  Post chemotherapy, she has been increasingly weak with oral mucositis with difficulty eating.  She developed worsening cough, increased secretions and concern for respiratory decline.  PCCM consulted for further pulmonary evaluation.    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  has past surgical history that includes No past surgeries and Video bronchoscopy (Bilateral, 03/13/2014).   HOME MEDICATIONS:  Prior to Admission medications   Medication Sig Start Date End Date Taking? Authorizing Provider  amitriptyline (ELAVIL) 25 MG tablet Take 100 mg by mouth daily.   Yes Historical Provider, MD  Calcium Carb-Cholecalciferol (CALCIUM 600 + D PO) Take 1 tablet by mouth daily.   Yes Historical Provider, MD  folic acid (FOLVITE) 1 MG tablet Take 1 mg by mouth daily.   Yes Historical Provider, MD  gabapentin (NEURONTIN) 400 MG capsule Take 400  mg by mouth daily.   Yes Historical Provider, MD  methotrexate (RHEUMATREX) 2.5 MG tablet Take 15 mg by mouth once a week. On Thursday. Caution:Chemotherapy. Protect from light.   Yes Historical Provider, MD  predniSONE (DELTASONE) 5 MG tablet Take 10 mg by mouth daily with breakfast.   Yes Historical Provider, MD  vitamin E 400 UNIT capsule Take 400  Units by mouth 2 (two) times daily.   Yes Historical Provider, MD   Allergies  Allergen Reactions  . Ativan [Lorazepam] Other (See Comments)    Altered mental status after ativan 1 mg iv    FAMILY HISTORY:  has no family status information on file.    SOCIAL HISTORY:  reports that she quit smoking about 14 months ago. She does not have any smokeless tobacco history on file. She reports that she drinks alcohol. She reports that she does not use illicit drugs.  REVIEW OF SYSTEMS:  Unable to complete with patient, information obtained from previous medical documentation and family at bedside.   SUBJECTIVE:   VITAL SIGNS: Temp:  [98 F (36.7 C)-99.2 F (37.3 C)] 99.2 F (37.3 C) (11/11 0810) Pulse Rate:  [82-142] 128 (11/11 0858) Resp:  [18-31] 25 (11/11 0858) BP: (108-175)/(43-105) 175/81 mmHg (11/11 0911) SpO2:  [85 %-100 %] 94 % (11/11 0858) Weight:  [120 lb 9.5 oz (54.7 kg)] 120 lb 9.5 oz (54.7 kg) (11/11 0400)   HEMODYNAMICS:     VENTILATOR SETTINGS:     INTAKE / OUTPUT:  Intake/Output Summary (Last 24 hours) at 03/27/14 1007 Last data filed at 03/27/14 0800  Gross per 24 hour  Intake 2914.13 ml  Output   2375 ml  Net 539.13 ml    PHYSICAL EXAMINATION: General:  Uncomfortable appearing adult female  Neuro:  Awake, alert, intermittent fatigue/sleeping  HEENT:  Mm with oral lesions / mucositis  Cardiovascular:  s1s2 rrr, no m/r/g Lungs:  resp's even/non-labored, R lung diminished, few rhonchi, left clear  Abdomen:  Round/soft, bsx4 active  Musculoskeletal:  No acute deformities  Skin:  Warm/dry, no edema   LABS:  CBC  Recent Labs Lab 03/25/14 0400 03/26/14 0419 03/27/14 0345  WBC 0.3* 0.3* 0.2*  HGB 9.8* 10.4* 9.3*  HCT 29.6* 31.0* 27.4*  PLT 53* 34* 93*   Coag's No results for input(s): APTT, INR in the last 168 hours.   BMET  Recent Labs Lab 03/24/14 0445 03/25/14 0400 03/26/14 0419  NA 153* 142 140  K 2.9* 2.6* 3.0*  CL 104 98 98   CO2 34* 33* 31  BUN 29* 21 15  CREATININE 0.88 0.61 0.63  GLUCOSE 100* 281* 95   Electrolytes  Recent Labs Lab 03/23/14 0525 03/24/14 0445 03/25/14 0400 03/26/14 0419  CALCIUM 9.3 9.4 9.1 9.1  MG  --  1.6 1.4* 1.7  PHOS 3.6 3.8 2.4  --    Sepsis Markers No results for input(s): LATICACIDVEN, PROCALCITON, O2SATVEN in the last 168 hours.   ABG No results for input(s): PHART, PCO2ART, PO2ART in the last 168 hours.   Liver Enzymes  Recent Labs Lab 03/24/14 0445 03/25/14 0400 03/26/14 0419  AST 56* 41* 41*  ALT 34 24 21  ALKPHOS 116 97 100  BILITOT 0.6 0.4 0.7  ALBUMIN 2.1* 1.9* 2.0*   Cardiac Enzymes No results for input(s): TROPONINI, PROBNP in the last 168 hours.   Glucose No results for input(s): GLUCAP in the last 168 hours.  Imaging Dg Abd 1 View  03/26/2014   CLINICAL DATA:  Assess feeding tube placement.  EXAM: ABDOMEN - 1 VIEW  COMPARISON:  CT abdomen 03/16/2014  FINDINGS: Enteric tube courses through the region of the stomach with tip over the midline in the mid abdomen likely over the distal stomach. Bowel gas pattern is nonobstructive. There is no definite free peritoneal air. Remainder of the exam is unchanged to include moderate right lung opacification.  IMPRESSION: Nonobstructive bowel gas pattern.  Enteric tube with tip over the midline of the mid abdomen likely in the distal stomach.   Electronically Signed   By: Marin Olp M.D.   On: 03/26/2014 18:28     ASSESSMENT / PLAN:  PULMONARY OETT A: Acute Hypoxic Respiratory Failure - in setting of metastatic small cell lung cancer, poor prognosis RUL Post Obs PNA Metastatic Small Cell Lung Cancer - mets to bone, liver.  No brain involvement Bilateral Pleural Effusions - R>L, s/p thora x2 P:   Monitor respiratory status closely, high risk for intubation  Monitor pleural effusions, may be candidate for pleurex for therapeutic relief Duoneb TID with Q3 PRN albuterol  Pulmonary hygiene as able    See ID Prednisone 10 mg QD Recommend no intubation or CPR, family discussing EOL issues Trend CXR PRN  CARDIOVASCULAR RUE PICC 11/2 >>>  A:  Tachycardia - reactive in setting of pain  HTN  P:  Tele monitoring  PRN hydralazine & lopressor for HTN   RENAL A:   Acute Kidney Injury Hypernatremia  Hypokalemia Hypomagnesemia  P:   Trend BMP Replace electrolytes as able D5 @ 50 ml/hr  GASTROINTESTINAL A:   Severe Protein Calorie Malnutriton Nausea without vomiting Mucositis  Constipation  P:   QD Miralax PRN zofran  Oral care, Magic mouth wash Marinol before meals Replace feeding tube, resume TF per protocol  PPI Monitor for aspiration  HEMATOLOGIC A:   Anemia of Chronic Disease  Pancytopenia P:  Trend CBC Folic acid Neupogen   INFECTIOUS A:   Febrile Neutropenia  Hx of MRSA Colonization P:   BCx2 11/10 >>  UC 11/10 >> neg  Pleural Fluid 11/10 >> neg  C-Diff PCR 11/08 >> neg  Pleural Fluid 11/02 >> neg  BCx2 11/02 >> neg  UC 10/27 >> 100k multiple morphotypes   Abx: Primaxin, start date 11/06, day 6/x Abx: Vanco, start date11/10, day 2/x  AV: Acyclovir, start date 11/10, day 2/x  Monitor fever curve / leukocytosis   ENDOCRINE A:   At Risk Hypoglycemia    P:   D5 while NPO Monitor glucose   NEUROLOGIC A:   Pain  Depression  Acute Encephalopathy Rheumatoid Arthritis  P:   RASS goal: n/a PRN oxycodone, xanax Low dose morphine IV for pain / SOB as NPO, 72m Q3 PRN pain   FAMILY  - Updates: Husband, sister and son updated at bedside.    - Inter-disciplinary family meet or Palliative Care meeting:  Discussed patients current state of health and prognosis.  Informed family regarding intubation / further support.  They wish to discuss options.  Recommended no intubation or CPR.  Will follow up with family this afternoon.    BNoe Gens NP-C [facility] Pgr: [phone] [phone] ATTENDING NOTE: I have  personally reviewed patient's available data, including medical history, events of note, physical examination and test results as part of my evaluation. I have discussed with resident/NP and other careteam providers such as pharmacist, RN and RRT & co-ordinated with consultants. In addition, I personally evaluated patient and  elicited key history of resp distress, new diagnosis of SCLC by bronchoscopy earlier durign this admission s/p chemo x 1 cycle with profound neutropenia & weakness, persistent RUL collapse,thoracentesis x2 exam findings of extreme debility,decreased BS on right, weak cough, mucosistis & labs showing profound neutropenia (on neupogen).  Pilar Plate discussion with family including husband & son & advised against mechanical ventilation (DNI status). Tried to involve pt but she seemed too tired to make decisions.  Rest per NP/medical resident whose note is outlined above and that I agree with and edited in full.    Kara Mead MD. [place] Flood. [facility] Pager [phone] If no response call [phone]    03/27/2014, 10:07 AM

## 2014-03-27 NOTE — Progress Notes (Signed)
Palliative Medicine Team has been shadowing Debbie Howe's chart for palliative needs. Overall her pain is much improved and her goals of care are evolving with regard to her cancer treatment and disease status. Please feel free to call us if we can be of further assistance with her care- she is receiving excellent primary palliative care from her hospitalist and oncology team.  We can be reached at [phone] - will sign off for now.  Lane Hacker, DO Palliative Medicine [phone]

## 2014-03-27 NOTE — Progress Notes (Signed)
Extensive discussion with common-law husband (of 31 years) regarding patients current clinical status and concerns for respiratory decline.  Reviewed her prior wishes of not wanting "life support".  Discussed that she may need intubation / mechanical ventilation in the near future and that she would likely not survive the illness if she were decompensate to that point.  He indicates verbal understanding.  Further discussed the concept of CPR and that this also would not likely be of benefit to the patient.  Reviewed the concept of continued treatment (chemo, meds, ABX, IVF, feeding etc) up to the point of intubation / CPR.  He agrees that this is likely the best approach and wants to talk with her sister and sons.  It would be a group decision regarding [place].     Plan: Follow up with family regarding CPR / intubation Continue aggressive care Family understands that if she were to decompensate overnight, that she would be intubated unless they instructed otherwise.   Additional Time: 60 minutes    Noe Gens, NP-C [facility] Pgr: [phone] or [phone]

## 2014-03-27 NOTE — Progress Notes (Signed)
Unfortunately,she pulled out the  Panda tube last night. We had this placed yesterday so she have some enteral nutrition while recovering from her first cycle of chemotherapy.   she still quite neutropenic. Her white cell count is 0.2.I will increase the Neupogen dose to 600 g.  I really think that the Panda tube is essential area in her stomach and intestines work.trying to feed her enterally is the best way of getting calories and protein into her.   A PEG tube I does think is too aggressive. I would think that she would only need to have enteral feeds for a few days. Once her blood counts improve, she will have healing of the mucositis in her mouth. She will then be able to eat. Pap I talked to her this morning. She was willing to have have the Panda tube replaced. She must have changed her mind.  She is on broad-spectrum antibiotic coverage. She is on antifungal coverage. She is on anti-viral coverage. So far, blood cultures have been negative.she is still febrile with a temperature to 103.1.  I think that she has responded to treatment. We just have to support her through the neutropenia.  I really would hate to have to feed her with TPN. Again, her intestines work and I were really want to try to get the most calories and protein into her.  As always, I prayed with her this morning. I asked her if she wanted to pray with me and she said yes.  I very much appreciate all the great care that she is getting down in the ICU. On her that this wasn't going to be a long hospitalization. She clearly is not ready for discharge.  Pete E.

## 2014-03-27 NOTE — Progress Notes (Signed)
Patient ID: Debbie Howe, female   DOB: 30-Aug-1945, 68 y.o.   MRN: [id number] [facility] PROGRESS NOTE  Debbie Howe TKZ:[id number] DOB: 06-03-1945 DOA: 03/16/2014 PCP: Charletta Cousin, MD  Brief narrative: 68 y.o. female with a PMH of rheumatoid arthritis, depression who was admitted to [facility] 02/17/2014 with shortness of breath. 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 Patient was transferred to [facility] 10/3/12015 and has completed her first course of chemotherapy. Barrier to discharge is her ongoing respiratory failure, weakness, and pancytopenia.  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 No evidence of brain metastasis. - Palliative care team 03/17/14 continues to follow. Patient remains a full code at this time. - Dr. Marin Olp following, status post first cycle of chemotherapy. No evidence of tumor lysis syndrome.  - Therapeutic/diagnostic thoracentesis done 03/18/14, 600 cc pleural fluid removed, pleural fluid cultures negative. Repeat thoracentesis done 03/24/14 with 750 mL fluid removed.  Chest x-ray on 03/25/2014 showed small right pleural effusion.  - patient is on imipenem for likely postobstructive pneumonia  - patient's breathing  is more labored this morning. She is very congested, unable to cough up secretions. Will consult PCCM in case  patient may require intubation. Family was still adamant about full code status.  - Continue current nebulizer treatment, dual neb 3 times daily scheduled and every 2 hours as needed for shortness of breath or wheezing - Patient was given Lasix x 2 03/23/14 due to increased chest congestion and concerns for pulmonary edema. As mentioned, respiratory status is guarded but stable.  Active Problems:  MRSA carrier - Continue decontamination therapy.   Cancer related pain - appreciate palliative care team following and their recommendations - current pain med: toradol 30 mg IV every 6 hours PRN severe pain   Hypernatremia - likely secondary to dehydration. Sodium is now within normal limits. Resolved with IV fluids.   Acute kidney injury - Creatinine normalized with increased IV fluids.   Sepsis secondary to community-acquired postobstructive pneumonia - Sepsis criteria met on admission with fever, tachycardia, leukocytosis and probable postobstructive pneumonia. - Treated with vancomycin and Zosyn. Vancomycin subsequently discontinued 03/17/14. Changed zosyn to imipenem from 03/22/2014. - Pleural fluid cultures negative.   Cancer related pain/abdominal pain/knee pain - pain controlled with Toradol IV every 6 hours PRN severe pain    Nausea without vomiting - Continue anti-emetics as needed. - per oncology recommendations, plan for NG tube placement   Loss of appetite / severe protein calorie malnutrition - Dietitian consulted. Continue Marinol and nutritional supplements. - Swallowing evaluation requested secondary to concerns for ability to protect airway. Deferred 03/23/14 secondary to increased oxygen requirement, increased HR/RR and congestion. NPO for now except for some meds if pt able to swallow.  - placement of NG tube per oncology recommendations - attempted and successful but pt pulled he tube out     Mucositis - likely secondary to side effects of  chemotherapy - lots of mouth ulcers. Continue acyclovir, fluconazole, mouthwashes -  patient is on low-dose prednisone for rheumatoid arthritis and is on PPI therapy which may help with ulcers as well.   Depression - Continue Elavil.   Hypokalemia / hypomagnesemia - likely secondary to GI losses. - Given 6 runs IV potassium and 2 g IV magnesium. - follow up BMP in am    Rheumatoid arthritis - Continue prednisone. Methotrexate on hold.   Anemia of chronic disease/thrombocytopenia/leukopenia / pancytopenia - pancytopenia likely secondary to bone marrow suppression from malignancy and sequela of chemotherapy - Monitor counts closely and transfuse as needed. - Continue Neupogen. ANC is zero. - Status post a total of 5 units of PRBCs and 2 units of platelets. Hgb and platelet count stable post transfusion.   Acute encephalopathy - Likely secondary to side effects of anxiolytics. Resolved.   DVT Prophylaxis - Continue SCDs.  Code Status: Full. Family Communication: [name] (significant other). Niece updated at the bedside today. Disposition Plan: patient remains in step down unit.   IV access:   Right upper extremity PICC line placed 03/18/14  Procedures and diagnostic studies:      Bronchoscopy 03/11/2014 - Cytology brushings from RML and RUL; EBBx from RUL bronchus and a nodule from the R mainstem; TBNA targeting pretracheal and subtracheal lymph nodes; All specimens sent for cytology and surg path.  Ct Abdomen Pelvis W Contrast 03/17/2014 1. Progressive enlargement of right pleural effusion with visible new atelectasis and collapse of the right lower lung. Correlation with chest x-ray a may be helpful as this may now represent a largely drowned right lung. 2. New small left pleural effusion. 3. 3 right hepatic lesions are identified, likely representing metastatic disease to the liver. The largest measures 1.5 cm. No other evidence of metastatic disease in the abdomen or  pelvis.   Dg Chest Port 1 View 03/17/2014 Complete opacification of the right hemithorax due to a combination of right effusion and right upper lobe consolidation with right upper lobe mass as noted on recent CT. Suspect a combination of interstitial edema and redistribution of blood flow the left lung. No airspace consolidation on the left.   Ir Thoracentesis Asp Pleural Space W/img Guide 03/18/2014: Successful ultrasound guided right thoracentesis yielding 600 cc of pleural fluid. Electronically Signed By: Maryclare Bean M.D. On: 03/18/2014 16:21   Dg chest 1 view 03/18/14: 1. Grossly unchanged near complete opacification of the right lung secondary to unchanged moderate-sized effusion and extensive right lung consolidative opacities / drowned lung. No evidence of pneumothorax. 2. Right upper extremity approach PICC line tip projects over the superior cavoatrial junction.  Dg Chest Port 1 View 03/21/2014: 1. Again noted is complete opacification of the right hemithorax with volume loss on the right. The right mainstem bronchus is poorly visualized and may be occluded by previously identified right hilar mass as noted on prior CT of 03/08/2014. Associated right pleural effusion may be present. 2. Mild left base subsegmental atelectasis and or infiltrate.   Dg Chest Port 1 View 03/22/2014: No change. Complete opacification of the right hemi thorax without mediastinal shift. Mild patchy density at the left lung base.   Dg Chest Port 1 View 03/23/2014: No change from the earlier study.   Dg Chest Port 1 View 03/23/2014: No significant interval change in the appearance of the chest. Persistent whiteout of the right hemi thorax with probable combination of pleural effusion and postobstructive collapse.   Thoracentesis 03/24/14: 750 mL bloody fluid removed.  Dg Chest Port 1 View 03/24/2014: 1. No pneumothorax following right thoracentesis.  2. Significantly decreased right pleural fluid. 3. Large  right upper lobe mass. 4. Changes of COPD and chronic bronchitis with pulmonary vascular congestion and mild interstitial pulmonary edema.   Dg Chest Port 1 View 03/25/2014 1. PICC line stable position. 2. Large right upper lobe mass, unchanged. 3. Small right pleural effusion.  Dg Abd 1 View 03/26/2014   Nonobstructive bowel gas pattern.  Enteric tube with tip over the midline of the mid abdomen likely in the distal stomach.    Medical Consultants:   Oncology (Dr. Burney Gauze)  Interventional Radiology (Dr. Barbie Banner)  Palliative care (Dr. Timmie Foerster)  PCCM  Other Consultants:   Nutrition   IAnti-Infectives:    Zosyn 02/18/2014 --> 03/22/2014  Vancomycin 02/19/2014 --> 03/17/2014  Imipenem 03/22/2014 -->   Leisa Lenz, MD  [facility] Pager [phone]  If 7PM-7AM, please contact night-coverage [email or web address] Password [email or web address] 03/27/2014, 6:54 AM   LOS: 15 days    HPI/Subjective: No acute overnight events.  Objective: Filed Vitals:   03/27/14 0300 03/27/14 0400 03/27/14 0500 03/27/14 0600  BP: 122/48 123/65 142/90 153/77  Pulse: 112 103 113 108  Temp:  98.2 F (36.8 C)    TempSrc:  Axillary    Resp: 26 27 19 29   Height:      Weight:  54.7 kg (120 lb 9.5 oz)    SpO2: 85% 97% 100% 94%    Intake/Output Summary (Last 24 hours) at 03/27/14 0654 Last data filed at 03/27/14 0600  Gross per 24 hour  Intake 3254.13 ml  Output   2375 ml  Net 879.13 ml    Exam:   General:  Pt is awake and not in distress  Cardiovascular: tachycardic, S1/S2 appreciated  Respiratory: congested with audible gurgling sounds, breaths are faster and shallow  Abdomen: Soft, non tender, non distended, bowel sounds present  Extremities: No edema, pulses DP and PT palpable bilaterally  Neuro: Grossly nonfocal  Data Reviewed: Basic Metabolic Panel:  Recent Labs Lab 03/21/14 0452 03/22/14 0645 03/23/14 0525 03/24/14 0445 03/25/14 0400 03/26/14 0419  NA 145 144  153* 153* 142 140  K 3.5* 3.9 3.5* 2.9* 2.6* 3.0*  CL 103 103 111 104 98 98  CO2 28 31 30  34* 33* 31  GLUCOSE 119* 100* 91 100* 281* 95  BUN 49* 41* 32* 29* 21 15  CREATININE 1.58* 1.32* 0.97 0.88 0.61 0.63  CALCIUM 9.4 8.8 9.3 9.4 9.1 9.1  MG 2.1  --   --  1.6 1.4* 1.7  PHOS 5.4* 3.7 3.6 3.8 2.4  --    Liver Function Tests:  Recent Labs Lab 03/22/14 0645 03/23/14 0525 03/24/14 0445 03/25/14 0400 03/26/14 0419  AST 102* 68* 56* 41* 41*  ALT 56* 44* 34 24 21  ALKPHOS 151* 123* 116 97 100  BILITOT 0.4 0.3 0.6 0.4 0.7  PROT 5.5* 5.3* 6.0 5.6* 5.9*  ALBUMIN 1.9* 1.7* 2.1* 1.9* 2.0*   No results for input(s): LIPASE, AMYLASE in the last 168 hours. No results for input(s): AMMONIA in the last 168 hours. CBC:  Recent Labs Lab 03/23/14 0525 03/24/14 0445 03/25/14 0400 03/26/14 0419 03/27/14 0345  WBC 0.6* 0.5* 0.3* 0.3* 0.2*  NEUTROABS 0.2* 0.0* 0.0* 0.0* 0.0*  HGB 8.3* 10.6* 9.8* 10.4* 9.3*  HCT 25.4* 31.7* 29.6* 31.0* 27.4*  MCV 93.0 89.5 89.7 88.8 87.8  PLT 12* 91* 53* 34* 93*   Cardiac Enzymes: No results for input(s): CKTOTAL, CKMB, CKMBINDEX, TROPONINI in the last 168 hours. BNP: Invalid input(s): POCBNP  CBG: No results for input(s): GLUCAP in the last 168 hours.  Recent Results (from the past 240 hour(s))  Body fluid culture     Status: None   Collection Time: 03/18/14  3:45 PM  Result Value Ref Range Status   Specimen Description PLEURAL  Final   Special Requests NONE  Final   Gram Stain   Final    RARE WBC PRESENT,BOTH PMN AND MONONUCLEAR NO ORGANISMS SEEN Performed at [facility]    Culture   Final    NO GROWTH 3 DAYS Performed at [facility]    Report Status 03/22/2014 FINAL  Final  Body fluid culture     Status: None (Preliminary result)   Collection Time: 03/24/14 11:40 AM  Result Value Ref Range Status   Specimen Description THORACENTESIS  Final   Special Requests Immunocompromised  Final   Gram Stain   Final    FEW WBC  PRESENT, PREDOMINANTLY PMN NO ORGANISMS SEEN Performed at [facility]    Culture   Final    NO GROWTH 2 DAYS Performed at [facility]    Report Status PENDING  Incomplete  Clostridium Difficile by PCR     Status: None   Collection Time: 03/24/14  3:36 PM  Result Value Ref Range Status   C difficile by pcr NEGATIVE NEGATIVE Final    Comment: Performed at [facility]     Scheduled Meds: . acyclovir  5 mg/kg Intravenous 3 times per day  . antiseptic oral rinse  7 mL Mouth Rinse QID  . chlorhexidine  15 mL Mouth Rinse QID  . Chlorhexidine Gluconate Cloth  6 each Topical Q0600  . dronabinol  5 mg Oral BID AC  . filgrastim  480 mcg Subcutaneous Daily  . fluconazole (DIFLUCAN) IV  400 mg Intravenous Q24H  . folic acid  1 mg Oral Daily  . imipenem-cilastatin  250 mg Intravenous Q6H  . ipratropium-albuterol  3 mL Nebulization TID  . mupirocin ointment  1 application Nasal BID  . pantoprazole (PROTONIX) IV  40 mg Intravenous Q12H  . polyethylene glycol  17 g Oral Daily  . predniSONE  10 mg Oral Q breakfast  . protein supplement  1 scoop Oral BID WC  . scopolamine  1 patch Transdermal Q72H  . sodium chloride  3 mL Intravenous Q12H  . vancomycin  750 mg Intravenous Q12H   Continuous Infusions: . dextrose 50 mL/hr at 03/26/14 1600  . feeding supplement (OSMOLITE 1.2 CAL) Stopped (03/27/14 0314)

## 2014-03-27 NOTE — Progress Notes (Signed)
CRITICAL VALUE ALERT  Critical value received:  Potassium >7  Date of notification:  03/27/14  Time of notification:  2226  Critical value read back:Yes.    Nurse who received alert:  Lolita Lenz. RN  MD notified (1st page):  Baltazar Najjar  Time of first page:  2226  MD notified (2nd page):  Time of second page:  Responding MD:  Baltazar Najjar   Time MD responded:  2228  Received orders to redraw lab. EKG showing no cardiac changes at this time.

## 2014-03-27 NOTE — Progress Notes (Signed)
CRITICAL VALUE ALERT  Critical value received:  K+ <2.2  Date of notification:  03/27/14  Time of notification:  1300  Critical value read back:Yes.    Nurse who received alert:  Leola Brazil  MD notified (1st page):  Dr. Charlies Silvers  Time of first page:  1305  MD notified (2nd page): Dr. Charlies Silvers  Time of second page: 1318  Responding MD:  Dr. Charlies Silvers  Time MD responded:  1325

## 2014-03-27 NOTE — Progress Notes (Signed)
NUTRITION FOLLOW UP  Pt meets criteria for severe MALNUTRITION in the context of chronic illness as evidenced by PO intake < 75% for > one month, 13% body weight loss in < 3 months.   Intervention:   -Once TF access obtained (panda tube per MD):  -Resume Osmolite 1.2 at 20 ml/hr, increase 10 ml every 4 hours to goal of 60 ml/hr to provide:  1728 kcal, 80 gm protein, 1181 ml free water. -Discussed enteral nutrition initiation, its role, and importance of nutrition during illness with family.  -Recommend SLP continue to follow to assess PO tolerance -RD to continue to monitor  Nutrition Dx:   Inadequate oral intake related to poor appetite, illness for the last 2 months as evidenced by Meal Completion: <25%; ongoing   Goal:   Pt to meet >/= 90% of their estimated nutrition needs; not met  Monitor:   Diet advancement, Supplement tolerance, total protein/energy intake, labs, weights  Assessment:   10/28: Pt with hx of RA, admitted with SOB, pt has extensive 50 year smoking hx. Per CT pt has a large right hilar mass which is concerning for lung ca. Pulmonology consulted for biopsy.  Pt with a lot of family in the room. Pt lives with her husband and son. Per family pt has been eating very poorly for the last 1 1/2 - 2 months. 24 hr recall: Breakfast: cereal or an egg, Lunch: nothing, Dinner: bites. Pt has had a poor appetite  Pt has not tried any PO supplements before but they feel she would like chocolate ensure.  Family very concerned about pt. Pt very sleepy but states she has probably lost 25 lb. Could not confirm this as she also states she usually weighs 138   11/04: -Pt continues with poor PO intake,, 0-25% -Has not been drinking Ensure, will modify to PRN -Family has been bringing in outside food sources to encourage PO intake - chocolate milkshake, strawberries, etc -Pt reported she enjoys most foods; however has early satiety. Recommend pt trial Beneprotein protein supplement  to increase nutrient density of foods pt is able to consume -Was willing to trial MagicCup as pt enjoys sweets, as well as 8PM snack of chocolate chip cookies and whole milk -Received first chemo on 11/02; pt noted to have constipation. Was given stool softener; which will likely assist in early satiety -Pt to start on Marinol BID on 11/04  11/9: -Patient with severe mucositis. -SLP has been following.  Recommends NPO currently.  Possible MBS if patient wakes up.  Wet gurgley breathing noted with probably aspiration of secretions.   -Diarrhea with question of c.diff. -Off oxygen.  Family states she looks much better than yesterday. -Possible feeding tube per oncologist if unable to eat. -Family reports patient likes Ensure but has not been able to drink/eat due to mucositis.  11/10: -SLP evaluated on 11/10, recommended to continue with NPO status and MBS. Expressed concern with placement of feeding tube and pt's current oral ulcerations/mucositis -Per discussion with RN, RN will attempt to place panda tube this afternoon.  -Discussed feeding tube placement with pt's family. Noted that it may be difficult to place NGT or panda; and pt may need to have long term nutritional support access, such as PEG. Family expressed understanding of both options.  -Due to pt's current lethargic state, family would likely have to sign consent for PEG placement as warranted -K low- being repleted; Phos/Mg WNL; continue to monitor  11/11: -Pt pulled out panda tube overnight. Osmolite  1.2 was running at 30 ml/hr. RN noted tolerating w/out residuals or aspirations.  -MD request panda tube be replaced as he believes she will not require long term nutrition support -Discussed wit family; family inquiring about SLP and diet advancement. Per SLP note on 11/10, SLP plans on following to assess PO tolerance  Height: Ht Readings from Last 1 Encounters:  02/16/2014 _0  (1.575 m)    Weight Status:   Wt Readings  from Last 1 Encounters:  03/27/14 120 lb 9.5 oz (54.7 kg)    Re-estimated needs:  Kcal: 1600-1800 Protein: 75-90 grams Fluid: >1.6 L/day  Skin: ecchymosis  Diet Order: Diet NPO time specified   Intake/Output Summary (Last 24 hours) at 03/27/14 1414 Last data filed at 03/27/14 1300  Gross per 24 hour  Intake 2824.13 ml  Output   2650 ml  Net 174.13 ml    Labs:   Recent Labs Lab 03/23/14 0525 03/24/14 0445 03/25/14 0400 03/26/14 0419 03/27/14 1210  NA 153* 153* 142 140 136*  K 3.5* 2.9* 2.6* 3.0* <2.2*  CL 111 104 98 98 93*  CO2 30 34* 33* 31 31  BUN 32* 29* _1 CREATININE 0.97 0.88 0.61 0.63 0.58  CALCIUM 9.3 9.4 9.1 9.1 8.3*  MG  --  1.6 1.4* 1.7  --   PHOS 3.6 3.8 2.4  --   --   GLUCOSE 91 100* 281* 95 94    CBG (last 3)  No results for input(s): GLUCAP in the last 72 hours.  Scheduled Meds: . sodium chloride   Intravenous Once  . sodium chloride   Intravenous Once  . acyclovir  5 mg/kg Intravenous 3 times per day  . antiseptic oral rinse  7 mL Mouth Rinse QID  . chlorhexidine  15 mL Mouth Rinse QID  . Chlorhexidine Gluconate Cloth  6 each Topical Q0600  . dronabinol  5 mg Oral BID AC  . [START ON 03/28/2014] filgrastim  600 mcg Subcutaneous Daily  . fluconazole (DIFLUCAN) IV  400 mg Intravenous Q24H  . folic acid  1 mg Oral Daily  . imipenem-cilastatin  250 mg Intravenous Q6H  . ipratropium-albuterol  3 mL Nebulization Q6H  . mupirocin ointment  1 application Nasal BID  . pantoprazole (PROTONIX) IV  40 mg Intravenous Q12H  . polyethylene glycol  17 g Oral Daily  . potassium chloride  10 mEq Intravenous Q1 Hr x 6  . potassium chloride      . potassium chloride  40 mEq Per Tube Once  . predniSONE  10 mg Oral Q breakfast  . protein supplement  1 scoop Oral BID WC  . scopolamine  1 patch Transdermal Q72H  . sodium chloride  3 mL Intravenous Q12H  . vancomycin  750 mg Intravenous Q12H    Continuous Infusions: . dextrose 50 mL/hr at 03/26/14  1600  . feeding supplement (OSMOLITE 1.2 CAL) Stopped (03/27/14 0314)   Atlee Abide MS RD LDN Clinical Dietitian [phone]

## 2014-03-27 NOTE — Telephone Encounter (Signed)
Received call from [place], [place] at [facility] ICU reporting that pt pulled out PANDA tube last night and is refusing replacement. Langley Gauss reports pt and family are willing to discuss PEG tube at this time. Per Dr Ginette Pitman, he does not wish to proceed with PEG. No new orders at this time. MD will discuss nutritional status with pt and family in the am. Langley Gauss aware. dph

## 2014-03-28 ENCOUNTER — Inpatient Hospital Stay (HOSPITAL_COMMUNITY): Payer: Medicare Other

## 2014-03-28 DIAGNOSIS — J9601 Acute respiratory failure with hypoxia: Secondary | ICD-10-CM | POA: Insufficient documentation

## 2014-03-28 LAB — CBC WITH DIFFERENTIAL/PLATELET
BASOS PCT: 0 % (ref 0–1)
Basophils Absolute: 0 10*3/uL (ref 0.0–0.1)
EOS ABS: 0 10*3/uL (ref 0.0–0.7)
Eosinophils Relative: 0 % (ref 0–5)
HCT: 26.3 % — ABNORMAL LOW (ref 36.0–46.0)
Hemoglobin: 9 g/dL — ABNORMAL LOW (ref 12.0–15.0)
LYMPHS PCT: 83 % — AB (ref 12–46)
Lymphs Abs: 0.2 10*3/uL — ABNORMAL LOW (ref 0.7–4.0)
MCH: 29.8 pg (ref 26.0–34.0)
MCHC: 34.2 g/dL (ref 30.0–36.0)
MCV: 87.1 fL (ref 78.0–100.0)
Monocytes Absolute: 0 10*3/uL — ABNORMAL LOW (ref 0.1–1.0)
Monocytes Relative: 4 % (ref 3–12)
NEUTROS PCT: 13 % — AB (ref 43–77)
Neutro Abs: 0 10*3/uL — ABNORMAL LOW (ref 1.7–7.7)
PLATELETS: 57 10*3/uL — AB (ref 150–400)
RBC: 3.02 MIL/uL — AB (ref 3.87–5.11)
RDW: 14.9 % (ref 11.5–15.5)
WBC: 0.2 10*3/uL — CL (ref 4.0–10.5)

## 2014-03-28 LAB — COMPREHENSIVE METABOLIC PANEL
ALK PHOS: 108 U/L (ref 39–117)
ALT: 14 U/L (ref 0–35)
AST: 30 U/L (ref 0–37)
Albumin: 1.8 g/dL — ABNORMAL LOW (ref 3.5–5.2)
Anion gap: 11 (ref 5–15)
BUN: 12 mg/dL (ref 6–23)
CALCIUM: 7.9 mg/dL — AB (ref 8.4–10.5)
CO2: 29 mEq/L (ref 19–32)
Chloride: 91 mEq/L — ABNORMAL LOW (ref 96–112)
Creatinine, Ser: 0.55 mg/dL (ref 0.50–1.10)
GFR calc non Af Amer: >90 mL/min (ref >90)
GLUCOSE: 135 mg/dL — AB (ref 70–99)
POTASSIUM: 3 meq/L — AB (ref 3.7–5.3)
SODIUM: 131 meq/L — AB (ref 137–147)
TOTAL PROTEIN: 5.4 g/dL — AB (ref 6.0–8.3)
Total Bilirubin: 0.6 mg/dL (ref 0.3–1.2)

## 2014-03-28 LAB — MAGNESIUM: MAGNESIUM: 1.2 mg/dL — AB (ref 1.5–2.5)

## 2014-03-28 LAB — POTASSIUM: Potassium: 2.5 mEq/L — CL (ref 3.7–5.3)

## 2014-03-28 LAB — LACTATE DEHYDROGENASE: LDH: 772 U/L — ABNORMAL HIGH (ref 94–250)

## 2014-03-28 LAB — VANCOMYCIN, TROUGH: Vancomycin Tr: 14 ug/mL (ref 10.0–20.0)

## 2014-03-28 MED ORDER — SODIUM CHLORIDE 0.9 % IV SOLN: 1.0000 mg | Freq: Every day | INTRAVENOUS | Status: DC

## 2014-03-28 MED ORDER — LORAZEPAM 2 MG/ML IJ SOLN: 0.5000 mg | Freq: Four times a day (QID) | INTRAMUSCULAR | Status: DC | PRN

## 2014-03-28 MED ORDER — POTASSIUM CHLORIDE 10 MEQ/100ML IV SOLN
10.0000 meq | INTRAVENOUS | Status: AC
  Administered 2014-03-28 (×3): 10 meq via INTRAVENOUS
  Filled 2014-03-28 (×2): qty 100

## 2014-03-28 MED ORDER — DEXAMETHASONE SODIUM PHOSPHATE 4 MG/ML IJ SOLN
2.0000 mg | INTRAMUSCULAR | Status: DC
  Administered 2014-03-28: 2 mg via INTRAVENOUS
  Filled 2014-03-28: qty 1

## 2014-03-28 MED ORDER — SODIUM CHLORIDE 0.9 % IV SOLN
Freq: Once | INTRAVENOUS | Status: AC
  Administered 2014-03-28: 10:00:00 via INTRAVENOUS

## 2014-03-28 MED ORDER — MAGNESIUM SULFATE 2 GM/50ML IV SOLN
2.0000 g | Freq: Once | INTRAVENOUS | Status: AC
  Administered 2014-03-28: 2 g via INTRAVENOUS
  Filled 2014-03-28: qty 50

## 2014-03-28 NOTE — Progress Notes (Signed)
Debbie Howe now has a feeding tube in. Her feeding tube rate is at 40 mL an hour. This definitely will help.  Unfortunately, her chest x-ray shows opacification of the right side. I suspect that she probably has recurrence of the pleural effusion. I think that a thoracentesis will be necessary.  She still is quite neutropenic. I increased her Neupogen up to 600 g.  She is very weak. She's had some potassium issues.  Of note, before starting her treatment, her LDH was 2900. Now it is down to 792. This has to be indicative of a response.  She still has temperature spike. So far, recent cultures have been negative.  I spoke to her husband at length. I told him that we are seen a response from my point of view. I know that, clinically, she still has a long way to go. I think we can just see the right lung mass responding, this will improve her.  We just have to support her through the neutropenia. If we get her through the neutropenia, and we still are not seen an improvement, then I think we really she could discuss end-of-life issues.  I am glad of the feeding tube is in. I know that she really hates this.  Her mouth is still "beaten up". I don't see this improving until her neutropenia resolves.  Her renal function is still okay.her liver tests are normal.  I appreciate everybody's help. I realize that this is a very tenuous situation. I still have optimism that she will improve. Again, with small cell lung cancer, even patients with profound disease burden, get better. I just feel that we have to continue to push as hard as possible. I know this is difficult, but I just want to give her a chance to get better.  Hopefully, the thoracentesis can be done and her right lung will expand a low bit more.  Lum Keas

## 2014-03-28 NOTE — Progress Notes (Addendum)
Potassium Level on redraw was 2.5.   Received orders for 3 runs of K and to check Mg level.   Mg result of 1.2. Received orders for Mg replacement and initiated.   Will hold AM labs until all replacement is completed.

## 2014-03-28 NOTE — Progress Notes (Signed)
RN paged earlier stating K level was now 7.7.  Pt's K level was low and is s/p 6 runs of K about one hour before RN call. K level was drawn from PICC line where K had been running in earlier. Will redraw K stat. Doubt legitimacy of reported K level of 7.7.   Later, lab reported K of 2.5 now, ran/confirmed x 2.  Pt has had 6 runs and will order 3 more for now and recheck CMP at 0500. Mag level added on and replace if needed.  Pt has had no changes in rhythm on tele.  Kirby-Graham, NP [facility]

## 2014-03-28 NOTE — Progress Notes (Signed)
PULMONARY / CRITICAL CARE MEDICINE   Name: Debbie Howe MRN: [id number] DOB: [birth year]    ADMISSION DATE:  02/25/2014 CONSULTATION DATE:  03/27/14  REFERRING MD :  Dr. Charlies Silvers   CHIEF COMPLAINT:  SOB   INITIAL PRESENTATION: 68 y/o F with a PMH of rheumatoid arthritis & depression admitted 10/27 with SOB. Work up found a large R hilar and suprahilar mass with extensive LAN.  FOB with bx consistent with small cell lung cancer.  MRI with cervical, lumbar metastases but no brain involvement.   She completed her first cycle of chemotherapy on 11/3.  Unfortunately, the patient has had a complicated course with pancytopenia, pain control issues, and dyspnea.  She has undergone 2 thoracentesis with some improvement in respiratory symptoms.  STUDIES:  10/27  CT Chest >> lg R hilar, suprahilar mass with extensive adenopathy, small R pleural effusion, post-ob pneumonitis in RUL C7 cervical rib fx's 10/30  MRI Brain >> widespread osseous metastatic disease including upper cervical spine & calvarium 10/30  MRI Lumbar Spine >> widespread metastatic disease to lumbar spine and pelvis 10/31  CT Abd/Pelvis >> progressive enlargement of R pleural effusion, new atx RLL, new small L effusion, R hepatic lesions (likely mets to liver)   SIGNIFICANT EVENTS: 10/27  Admit to [facility] with SOB.  CT Chest with a large R hilar and suprahilar mass with extensive LAN. 10/29  FOB w Bx > small cell lung CA 11/01  Met with Palliative Care Team, family desires a full code 1102  Therapeutic thoracentesis 600 ml removed 11/03  Completed first course of chemotherapy 11/08  Repeat Thora, 750 ml removed  11/11  Decline in respiratory status, PCCM consulted for evaluation     SUBJECTIVE: remains on 6L [place] Not very responsive-received pain meds overnight afebrile  VITAL SIGNS: Temp:  [98.9 F (37.2 C)-103.1 F (39.5 C)] 99 F (37.2 C) (11/12 0354) Pulse Rate:  [25-143] 111 (11/12 0600) Resp:  [14-49] 17 (11/12  0600) BP: (111-170)/(42-85) 147/85 mmHg (11/12 0600) SpO2:  [92 %-100 %] 98 % (11/12 0750)   HEMODYNAMICS:     VENTILATOR SETTINGS:     INTAKE / OUTPUT:  Intake/Output Summary (Last 24 hours) at 03/28/14 1106 Last data filed at 03/28/14 0600  Gross per 24 hour  Intake 3297.13 ml  Output   1245 ml  Net 2052.13 ml    PHYSICAL EXAMINATION: General:  Uncomfortable appearing adult female  Neuro:  Awake, alert, intermittent fatigue/sleeping  HEENT:  Mm with oral lesions / mucositis  Cardiovascular:  s1s2 rrr, no m/r/g Lungs:  resp's even/non-labored, R lung diminished, few rhonchi, left clear  Abdomen:  Round/soft, bsx4 active  Musculoskeletal:  No acute deformities  Skin:  Warm/dry, no edema   LABS:  CBC  Recent Labs Lab 03/26/14 0419 03/27/14 0345 03/28/14 0500  WBC 0.3* 0.2* 0.2*  HGB 10.4* 9.3* 9.0*  HCT 31.0* 27.4* 26.3*  PLT 34* 93* 57*   Coag's No results for input(s): APTT, INR in the last 168 hours.   BMET  Recent Labs Lab 03/26/14 0419 03/27/14 1210 03/27/14 2130 03/27/14 2250 03/28/14 0500  NA 140 136*  --   --  131*  K 3.0* <2.2* >7.7* 2.5* 3.0*  CL 98 93*  --   --  91*  CO2 31 31  --   --  29  BUN 15 14  --   --  12  CREATININE 0.63 0.58  --   --  0.55  GLUCOSE 95 94  --   --  135*   Electrolytes  Recent Labs Lab 03/23/14 0525  03/24/14 0445 03/25/14 0400 03/26/14 0419 03/27/14 1210 03/28/14 0115 03/28/14 0500  CALCIUM 9.3  --  9.4 9.1 9.1 8.3*  --  7.9*  MG  --   < > 1.6 1.4* 1.7  --  1.2*  --   PHOS 3.6  --  3.8 2.4  --   --   --   --   < > = values in this interval not displayed. Sepsis Markers No results for input(s): LATICACIDVEN, PROCALCITON, O2SATVEN in the last 168 hours.   ABG No results for input(s): PHART, PCO2ART, PO2ART in the last 168 hours.   Liver Enzymes  Recent Labs Lab 03/26/14 0419 03/27/14 1210 03/28/14 0500  AST 41* 32 30  ALT 21 16 14   ALKPHOS 100 109 108  BILITOT 0.7 0.7 0.6  ALBUMIN 2.0*  2.0* 1.8*   Cardiac Enzymes No results for input(s): TROPONINI, PROBNP in the last 168 hours.   Glucose No results for input(s): GLUCAP in the last 168 hours.  Imaging Dg Abd 1 View  03/27/2014   CLINICAL DATA:  Feeding tube placement  EXAM: ABDOMEN - 1 VIEW  COMPARISON:  March 26, 2014  FINDINGS: Feeding tube tip is in the right lower lobe region. There is consolidation throughout the visualized right lower lobe.  There are surgical clips the right upper quadrant. There is mild dilatation of the transverse colon. Bowel gas pattern is otherwise unremarkable.  IMPRESSION: Feeding tube tip in right lower lobe with consolidation on the right. Advise chest radiograph to further assess the opacification on the right. There is mild dilatation of mid transverse colon. Question early ileus. No free air seen.  Critical Value/emergent results were called by telephone at the time of interpretation on 03/27/2014 at 2:59 pm to [facility], RN, who verbally acknowledged these results.   Electronically Signed   By: Lowella Grip M.D.   On: 03/27/2014 15:00   Dg Abd Portable 1v  03/27/2014   CLINICAL DATA:  Feeding tube placement  EXAM: PORTABLE ABDOMEN - 1 VIEW  COMPARISON:  03/27/2014  FINDINGS: Feeding tube has been advanced. The tip is in the mid stomach. The stomach appears elongated.  IMPRESSION: Feeding tube tip in the midportion/body of an elongated stomach.   Electronically Signed   By: Rolm Baptise M.D.   On: 03/27/2014 18:07   Dg Abd Portable 1v  03/27/2014   CLINICAL DATA:  Feeding tube placement  EXAM: PORTABLE ABDOMEN - 1 VIEW  COMPARISON:  Portable exam 1629 hr compared to 1418 hr.  FINDINGS: Tip of feeding tube projects over gastroesophageal junction.  Bowel gas pattern normal.  Surgical clips RIGHT upper quadrant question cholecystectomy.  Elevation of RIGHT diaphragm with question atelectasis versus infiltrate at RIGHT base.  Bones slightly demineralized.  No urinary tract  calcification.  IMPRESSION: Tip feeding tube at gastroesophageal junction, recommend advancing into small bowel.   Electronically Signed   By: Lavonia Dana M.D.   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  No brain involvement Bilateral Pleural Effusions - R>L, s/p thora x2 P:   Duoneb TID with Q3 PRN albuterol  Pulmonary hygiene as able      RENAL A:   Acute Kidney Injury Hypernatremia  Hypokalemia  Hypomagnesemia  P:   Trend BMP Replace electrolytes as able D5 @ 50 ml/hr   HEMATOLOGIC A:   Anemia of Chronic Disease  Pancytopenia P:  Trend CBC Folic acid Neupogen   INFECTIOUS A:   Febrile Neutropenia  Hx of MRSA Colonization P:   BCx2 11/10 >>  UC 11/10 >> neg  Pleural Fluid 11/10 >> neg  C-Diff PCR 11/08 >> neg  Pleural Fluid 11/02 >> neg  BCx2 11/02 >> neg  UC 10/27 >> 100k multiple morphotypes   Abx: Primaxin, start date 11/06, day 6/x Abx: Vanco, start date11/10, AV: Acyclovir, start date 11/10,  Monitor fever curve / leukocytosis   ENDOCRINE A:   At Risk Hypoglycemia    P:   D5 while NPO Monitor glucose   NEUROLOGIC A:   Pain  Depression  Acute Encephalopathy Rheumatoid Arthritis  P:   RASS goal: n/a PRN oxycodone, xanax Low dose morphine IV for pain / SOB as NPO, 22m Q3 PRN pain   FAMILY  - Updates: 11/11 Husband, sister and son updated at bedside.    - Inter-disciplinary family meet or Palliative Care meeting: advised  Summary - unfortunately, she has complete atelectasis on right. I do not share dr eAntonieta Pertoptimism here & have communicated with family on 11/11. Based on further discussion today, DNR issued. Mediastinal shift seems to suggest rt atelectasis - pleural effusion maybe reaccumulating but I doubt further thora  of any benefit. I would suggest a palliaitve approach here - comfort seems to be competing for importance here. We will sign off   RKara MeadMD. FCCP. [facility] Pager [phone] no response call [phone]    03/28/2014, 11:06 AM

## 2014-03-28 NOTE — Progress Notes (Addendum)
Patient ID: Debbie Howe, female   DOB: [birth year], 68 y.o.   MRN: [id number] [facility] PROGRESS NOTE  Debbie Howe JIR:[id number] DOB: [birth year] DOA: 02/28/2014 PCP: Charletta Cousin, MD  Brief narrative: 68 y.o. female with a PMH of rheumatoid arthritis, depression who was admitted to [facility] 02/15/2014 with shortness of breath. 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 Patient was transferred to [facility] 03/16/2014 and has completed her first course of chemotherapy. Barrier to discharge is her ongoing respiratory failure, weakness, and pancytopenia. I spoke with the patient's family at the bedside and per patient's significant other the family has decided on DNR/DNI status.  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 No evidence of brain metastasis. - patient is status post first cycle of chemotherapy. Appreciate oncology following. - Therapeutic/diagnostic thoracentesis done 03/18/14, 600 cc pleural fluid removed, pleural fluid cultures negative. Repeat thoracentesis done 03/24/14 with 750 mL fluid removed.  - patient is on imipenem for postobstructive pneumonia. Patient was febrile overnight. Added again vancomycin to antibiotic regimen. - PCCM consulted since respiratory status is declining. Repeat chest x-ray this morning shows complete opacification  of the right hemithorax likely due to combination of large mass and pleural effusion. - Per patient's family they agree that patient would not want aggressive measures so they all agreed to DNR/DNI code status. - Continue duoneb every 6 hours scheduled 3 times daily scheduled and albuterol nebulizer every 3 hours as needed. - Order placed for scopolamine patch to help with upper respiratory tract secretions.  Active Problems:  MRSA carrier - Continue decontamination therapy.   Cancer related pain - appreciate palliative care team following and their recommendations - continue current pain management efforts with morphine 1 mg every 3 hours as needed for severe pain   Hypernatremia - likely secondary to dehydration. Sodium subsequently normalized with IV fluids and this morning on a low side, 131.   Acute kidney injury - Creatinine normalized with increased IV fluids.   Sepsis secondary to community-acquired postobstructive pneumonia - Sepsis criteria met on admission with fever, tachycardia, leukocytosis and probable postobstructive pneumonia. - Treated with vancomycin and Zosyn. Vancomycin subsequently discontinued 03/17/14. Changed zosyn to imipenem from 03/22/2014. Due to ongoing neutropenia and episode of fever in past 24 hours vancomycin added. - Pleural fluid cultures negative.   Nausea without vomiting - Continue anti-emetics as needed.   Loss of appetite / severe protein calorie malnutrition - Dietitian consulted. Continue Marinol and nutritional supplements. - NG tube placed for feeding purposes.    Mucositis - likely secondary to side effects of chemotherapy - lots of mouth ulcers. Continue acyclovir, fluconazole, mouthwashes - continue PPI therapy    Depression - we will hold Elavil since patient cannot really take food or meds by mouth.   Hypokalemia / hypomagnesemia - likely secondary to GI losses. - Given 6 runs IV potassium and 2 g IV magnesium. -  continue to supplement potassium and magnesium.   Rheumatoid arthritis - Change prednisone to  Decadron IV. Methotrexate on hold.   Anemia of chronic disease/thrombocytopenia/leukopenia / pancytopenia - pancytopenia likely secondary to bone marrow suppression from malignancy and sequela of chemotherapy - Monitor counts closely and transfuse as needed. - Continue Neupogen. ANC is zero. - Status post a total of 5 units of PRBCs and 2 units of platelets. Hgb and platelet count stable post transfusion.   Acute encephalopathy - Likely secondary to side effects of anxiolytics. Resolved.   DVT Prophylaxis - Continue SCDs.  Code Status: DNR/DNI Family Communication: Debbie Howe (significant other) updated at the bedside today.  Disposition Plan: patient remains in step down unit.   IV access:   Right upper extremity PICC line placed 03/18/14  Procedures and diagnostic studies:    Bronchoscopy 03/09/2014 - Cytology brushings from RML and RUL; EBBx from RUL bronchus and a nodule from the R mainstem; TBNA targeting pretracheal and subtracheal lymph nodes; All specimens sent for cytology and surg path.  Ct Abdomen Pelvis W Contrast 03/17/2014 1. Progressive enlargement of right pleural effusion with visible new atelectasis and collapse of the right lower lung. Correlation with chest x-ray a may be helpful as this may now represent a largely drowned right lung. 2. New small left pleural effusion. 3. 3 right hepatic lesions are identified, likely representing metastatic disease to the liver. The largest measures 1.5 cm. No other evidence of metastatic disease in the abdomen or pelvis.   Dg Chest Port 1 View 03/17/2014 Complete opacification of the right hemithorax due to a combination of right effusion and right upper lobe consolidation with right upper lobe mass as noted on recent CT. Suspect a combination of interstitial edema and redistribution of blood flow the left lung. No airspace  consolidation on the left.   Ir Thoracentesis Asp Pleural Space W/img Guide 03/18/2014: Successful ultrasound guided right thoracentesis yielding 600 cc of pleural fluid. Electronically Signed By: Maryclare Bean M.D. On: 03/18/2014 16:21   Dg chest 1 view 03/18/14: 1. Grossly unchanged near complete opacification of the right lung secondary to unchanged moderate-sized effusion and extensive right lung consolidative opacities / drowned lung. No evidence of pneumothorax. 2. Right upper extremity approach PICC line tip projects over the superior cavoatrial junction.  Dg Chest Port 1 View 03/21/2014: 1. Again noted is complete opacification of the right hemithorax with volume loss on the right. The right mainstem bronchus is poorly visualized and may be occluded by previously identified right hilar mass as noted on prior CT of 02/20/2014. Associated right pleural effusion may be present. 2. Mild left base subsegmental atelectasis and or infiltrate.   Dg Chest Port 1 View 03/22/2014: No change. Complete opacification of the right hemi thorax without mediastinal shift. Mild patchy density at the left lung base.   Dg Chest Port 1 View 03/23/2014: No change from the earlier study.   Dg Chest Port 1 View 03/23/2014: No significant interval change in the appearance of the chest. Persistent whiteout of the right hemi thorax with probable combination of pleural effusion and postobstructive collapse.   Thoracentesis 03/24/14: 750 mL bloody fluid removed.  Dg Chest Port 1 View 03/24/2014: 1. No pneumothorax following right thoracentesis. 2. Significantly decreased right pleural fluid. 3. Large right upper lobe mass. 4. Changes of COPD and chronic bronchitis with pulmonary vascular congestion and mild interstitial pulmonary edema.   Dg Chest Port 1 View 03/25/2014 1. PICC line stable position. 2. Large right upper lobe mass, unchanged. 3. Small right pleural effusion.  Dg Abd 1 View 03/26/2014  Nonobstructive bowel  gas pattern. Enteric tube with tip over the midline of the mid abdomen likely in the distal stomach  Dg Abd 1 View 03/27/2014   Feeding tube tip in right lower lobe with consolidation on the right. Advise chest radiograph to further assess the opacification on the right. There is mild dilatation of mid transverse colon. Question early ileus. No free air seen.    Dg Chest Port 1 View 03/28/2014   Complete opacification of the right hemithorax, likely due to combination of large mass and pleural effusion. Left lung clear. Tube and catheter positions as described without pneumothorax.     Dg Abd Portable 1v 03/27/2014  Feeding tube tip in the midportion/body of an elongated stomach.    Dg Abd Portable 1v 03/27/2014   Tip feeding tube at gastroesophageal junction, recommend advancing into small bowel.     Medical Consultants:   Oncology (Dr. Burney Gauze)  Interventional Radiology (Dr. Barbie Banner)  Palliative care (Dr. Timmie Foerster)  PCCM Other Consultants:   Nutrition  IAnti-Infectives:    Zosyn 02/26/2014 --> 03/22/2014  Vancomycin 03/01/2014 --> 03/17/2014; restarted 03/26/2104 -->   Imipenem 03/22/2014 -->   Leisa Lenz, MD  [facility] Pager [phone]  If 7PM-7AM, please contact night-coverage [email or web address] Password [facility] 03/28/2014, 9:40 AM   LOS: 16 days    HPI/Subjective: No acute overnight events.  Objective: Filed Vitals:   03/28/14 0400 03/28/14 0500 03/28/14 0600 03/28/14 0750  BP: 111/77 125/84 147/85   Pulse: 104 115 111   Temp:      TempSrc:      Resp:   17   Height:      Weight:      SpO2: 97% 97% 99% 98%    Intake/Output Summary (Last 24 hours) at 03/28/14 0940 Last data filed at 03/28/14 0600  Gross per 24 hour  Intake 3417.13 ml  Output   1470 ml  Net 1947.13 ml    Exam:   General:  Pt is somnolent, looks more lethargic this morning.  Cardiovascular: tachycardic, S1/S2 appreciated  Respiratory: gurgling  sounds appreciated, wheezing  Abdomen: has NG tube, non tender abdomen, present bowel sounds  Extremities: No edema, pulses DP and PT palpable bilaterally  Neuro: Grossly nonfocal  Data Reviewed: Basic Metabolic Panel:  Recent Labs Lab 03/22/14 0645 03/23/14 0525 03/24/14 0445 03/25/14 0400 03/26/14 0419 03/27/14 1210 03/27/14 2130 03/27/14 2250 03/28/14 0115 03/28/14 0500  NA 144 153* 153* 142 140 136*  --   --   --  131*  K 3.9 3.5* 2.9* 2.6* 3.0* <2.2* >7.7* 2.5*  --  3.0*  CL 103 111 104 98 98 93*  --   --   --  91*  CO2 31 30 34* 33* 31 31  --   --   --  29  GLUCOSE 100* 91 100* 281* 95 94  --   --   --  135*  BUN 41* 32* 29* 21 15 14   --   --   --  12  CREATININE 1.32* 0.97 0.88 0.61 0.63 0.58  --   --   --  0.55  CALCIUM 8.8 9.3 9.4 9.1 9.1 8.3*  --   --   --  7.9*  MG  --   --  1.6 1.4* 1.7  --   --   --  1.2*  --   PHOS 3.7 3.6 3.8 2.4  --   --   --   --   --   --  Liver Function Tests:  Recent Labs Lab 03/24/14 0445 03/25/14 0400 03/26/14 0419 03/27/14 1210 03/28/14 0500  AST 56* 41* 41* 32 30  ALT 34 24 21 16 14   ALKPHOS 116 97 100 109 108  BILITOT 0.6 0.4 0.7 0.7 0.6  PROT 6.0 5.6* 5.9* 5.7* 5.4*  ALBUMIN 2.1* 1.9* 2.0* 2.0* 1.8*   No results for input(s): LIPASE, AMYLASE in the last 168 hours. No results for input(s): AMMONIA in the last 168 hours. CBC:  Recent Labs Lab 03/24/14 0445 03/25/14 0400 03/26/14 0419 03/27/14 0345 03/28/14 0500  WBC 0.5* 0.3* 0.3* 0.2* 0.2*  NEUTROABS 0.0* 0.0* 0.0* 0.0* 0.0*  HGB 10.6* 9.8* 10.4* 9.3* 9.0*  HCT 31.7* 29.6* 31.0* 27.4* 26.3*  MCV 89.5 89.7 88.8 87.8 87.1  PLT 91* 53* 34* 93* 57*   Cardiac Enzymes: No results for input(s): CKTOTAL, CKMB, CKMBINDEX, TROPONINI in the last 168 hours. BNP: Invalid input(s): POCBNP CBG: No results for input(s): GLUCAP in the last 168 hours.  Body fluid culture     Status: None   Collection Time: 03/18/14  3:45 PM  Result Value Ref Range Status    Specimen Description PLEURAL  Final    NO GROWTH 3 DAYS   Report Status 03/22/2014 FINAL  Final  Body fluid culture     Status: None   Collection Time: 03/24/14 11:40 AM  Result Value Ref Range Status   Specimen Description THORACENTESIS  Final   Culture   Final    NO GROWTH 3 DAYS   Report Status 03/27/2014 FINAL  Final  Clostridium Difficile by PCR     Status: None   Collection Time: 03/24/14  3:36 PM  Result Value Ref Range Status   C difficile by pcr NEGATIVE NEGATIVE Final    Comment: Performed at [facility]  Culture, blood (routine x 2)     Status: None (Preliminary result)   Collection Time: 03/26/14  7:50 AM  Result Value Ref Range Status   Specimen Description BLOOD LEFT ARM  Final   Culture  Setup Time   Final   Culture   Final           BLOOD CULTURE RECEIVED NO GROWTH TO DATE   Report Status PENDING  Incomplete  Culture, blood (routine x 2)     Status: None (Preliminary result)   Collection Time: 03/26/14  7:55 AM  Result Value Ref Range Status   Specimen Description BLOOD LEFT HAND  Final   Culture  Setup Time   Final    03/26/2014 11:04 Performed at [facility]    Culture   Final           BLOOD CULTURE RECEIVED NO GROWTH TO DATE    Report Status PENDING  Incomplete  Urine culture     Status: None   Collection Time: 03/26/14  8:51 AM  Result Value Ref Range Status   Specimen Description URINE, CATHETERIZED  Final   Culture NO GROWTH  Final   Report Status 03/27/2014 FINAL  Final     Scheduled Meds: . acyclovir  5 mg/kg Intravenous 3 times per day  . dronabinol  5 mg Oral BID AC  . filgrastim  600 mcg Subcutaneous Daily  . fluconazole   400 mg Intravenous Q24H  . folic acid  1 mg Oral Daily  . imipenem-cilastatin  250 mg Intravenous Q6H  . ipratropium-albuterol  3 mL Nebulization Q6H  . mupirocin ointment  1 application Nasal BID  .  pantoprazole   40 mg Intravenous Q12H  . polyethylene glycol  17 g Oral Daily  . predniSONE  10 mg  Oral Q breakfast  . protein supplement  1 scoop Oral BID WC  . scopolamine  1 patch Transdermal Q72H  . vancomycin  750 mg Intravenous Q12H   Continuous Infusions: . dextrose 50 mL/hr at 03/27/14 1434  . feeding supplement (OSMOLITE 1.2 CAL) 1,000 mL (03/28/14 0600)

## 2014-03-28 NOTE — Progress Notes (Signed)
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 Ativan [Lorazepam] Other (See Comments)    Altered mental status after ativan 1 mg iv    Patient Measurements: Height: 5\' 2"  (157.5 cm) Weight: 120 lb 9.5 oz (54.7 kg) IBW/kg (Calculated) : 50.1  Vital Signs: Temp: 99 F (37.2 C) (11/12 0354) Temp Source: Oral (11/12 0354) BP: 147/85 mmHg (11/12 0600) Pulse Rate: 111 (11/12 0600) Intake/Output from previous day: 11/11 0701 - 11/12 0700 In: 3687.1 [I.V.:1260; NG/GT:265.3; IV Piggyback:2161.8] Out: 1595 [Urine:1595] Intake/Output from this shift:    Labs:  Recent Labs  03/26/14 0419 03/27/14 0345 03/27/14 1210 03/28/14 0500  WBC 0.3* 0.2*  --  0.2*  HGB 10.4* 9.3*  --  9.0*  PLT 34* 93*  --  57*  CREATININE 0.63  --  0.58 0.55   Estimated Creatinine Clearance: 51.8 mL/min (by C-G formula based on Cr of 0.55).  Recent Labs  03/28/14 0940  [place] 14.0     Microbiology: Recent Results (from the past 720 hour(s))  Urine culture     Status: None   Collection Time: 02/28/2014  2:01 PM  Result Value Ref Range Status   Specimen Description URINE, CLEAN CATCH  Final   Special Requests NONE  Final   Culture  Setup Time   Final    03/01/2014 21:10 Performed at [place]   Final    >=100,000 COLONIES/ML Performed at [facility]   Culture   Final    Multiple bacterial morphotypes present, none predominant. Suggest appropriate recollection if clinically indicated. Performed at [facility]   Report Status 03/13/2014 FINAL  Final  Blood culture (routine x 2)     Status: None   Collection Time: 03/13/2014  2:20 PM  Result Value Ref Range Status   Specimen Description BLOOD RIGHT ANTECUBITAL  Final   Special Requests BOTTLES DRAWN AEROBIC AND ANAEROBIC 5MLS  Final   Culture  Setup Time   Final   03/11/2014 20:52 Performed at [facility]    Culture   Final    NO GROWTH 5 DAYS Performed at [facility]    Report Status 03/18/2014 FINAL  Final  Blood culture (routine x 2)     Status: None   Collection Time: 03/08/2014  3:15 PM  Result Value Ref Range Status   Specimen Description BLOOD RIGHT ANTECUBITAL  Final   Special Requests BOTTLES DRAWN AEROBIC AND ANAEROBIC 10MLS  Final   Culture  Setup Time   Final    02/14/2014 20:53 Performed at [facility]    Culture   Final    NO GROWTH 5 DAYS Performed at [facility]    Report Status 03/18/2014 FINAL  Final  MRSA PCR Screening     Status: Abnormal   Collection Time: 03/16/14  2:42 AM  Result Value Ref Range Status   MRSA by PCR POSITIVE (A) NEGATIVE Final    Comment:        The GeneXpert MRSA Assay (FDA approved for NASAL specimens only), is one component of a comprehensive MRSA colonization surveillance program. It is not intended to diagnose MRSA infection nor to guide or monitor treatment for MRSA infections. RESULT CALLED TO, READ BACK BY AND VERIFIED WITH: CALLED TO RN LETICIA DOBSON [phone]  THANEY  Body fluid culture     Status: None   Collection  Time: 03/18/14  3:45 PM  Result Value Ref Range Status   Specimen Description PLEURAL  Final   Special Requests NONE  Final   Gram Stain   Final    RARE WBC PRESENT,BOTH PMN AND MONONUCLEAR NO ORGANISMS SEEN Performed at [facility]    Culture   Final    NO GROWTH 3 DAYS Performed at [facility]    Report Status 03/22/2014 FINAL  Final  Body fluid culture     Status: None   Collection Time: 03/24/14 11:40 AM  Result Value Ref Range Status   Specimen Description THORACENTESIS  Final   Special Requests Immunocompromised  Final   Gram Stain   Final    FEW WBC PRESENT, PREDOMINANTLY PMN NO ORGANISMS SEEN Performed at [facility]    Culture   Final    NO GROWTH 3 DAYS Performed at [facility]    Report Status 03/27/2014 FINAL  Final  Clostridium Difficile by PCR     Status: None   Collection Time: 03/24/14  3:36 PM  Result Value Ref Range Status   C difficile by pcr NEGATIVE NEGATIVE Final    Comment: Performed at [facility]  Culture, blood (routine x 2)     Status: None (Preliminary result)   Collection Time: 03/26/14  7:50 AM  Result Value Ref Range Status   Specimen Description BLOOD LEFT ARM  Final   Special Requests BOTTLES DRAWN AEROBIC AND ANAEROBIC 5CC  Final   Culture  Setup Time   Final    03/26/2014 11:03 Performed at [facility]    Culture   Final           BLOOD CULTURE RECEIVED NO GROWTH TO DATE CULTURE WILL BE HELD FOR 5 DAYS BEFORE ISSUING A FINAL NEGATIVE REPORT Performed at [facility]    Report Status PENDING  Incomplete  Culture, blood (routine x 2)     Status: None (Preliminary result)   Collection Time: 03/26/14  7:55 AM  Result Value Ref Range Status   Specimen Description BLOOD LEFT HAND  Final   Special Requests BOTTLES DRAWN AEROBIC ONLY [place]  Final   Culture  Setup Time   Final    03/26/2014 11:04 Performed at [facility]    Culture   Final           BLOOD CULTURE RECEIVED NO GROWTH TO DATE CULTURE WILL BE HELD FOR 5 DAYS BEFORE ISSUING A FINAL NEGATIVE REPORT Note: Culture results may be compromised due to an inadequate volume of blood received in culture bottles. Performed at [facility]    Report Status PENDING  Incomplete  Urine culture     Status: None   Collection Time: 03/26/14  8:51 AM  Result Value Ref Range Status   Specimen Description URINE, CATHETERIZED  Final   Special Requests imipenem, diflucan Immunocompromised  Final   Culture  Setup Time   Final    03/26/2014 11:02 Performed at [place] Performed at [facility]   Final   Culture NO GROWTH Performed at [facility]   Final   Report Status 03/27/2014  FINAL  Final    Medical History: Past Medical History  Diagnosis Date  . Rheumatoid arthritis   . 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 Protein-calorie malnutrition, severe 03/19/2014  . Small cell carcinoma 03/10/2014  . 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 S/p chemo 11/2 - 11/4 and since has had ongoing respiratory failure, weakness and pancytopenia. She was previously treated with Zosyn and Vancomycin upon admission for sepsis secondary to community acquired postobstructive pneumonia. Now presenting with mucositis with possible viral ulcerations, neutropenic, and increased fevers. Pharmacy consulted to dose acyclovir and vancomycin.  10/27 azith/ctx x 1 10/27 >> Zosyn >> 11/7  10/27 >> Vanc >> 10/31 11/2 >> fluconazole (MD) >> 11/17 11/6 >> imipenem (MD) >> 11/17 11/10 >> Vanco >> 11/10 >> acyclovir >>  Tmax: AF WBC: 0.2 Renal: SCr 0.55 CrCl~79ml/min   10/31 MRSA screen (+) 10/27 urine cx: >100K cfu/ml multiple species 10/27 blood x 2: ng F 11/2 R pleural fluid: NG F 11/8 c. Diff: negative 11/8 fluid culture (thoracentesis): ng F 11/10 urine: ng F 11/10 blood x 2: ngtd  Levels/dose adjustments: 11/12 0930 VT: 14 on vanc 750mg  Q12H (drawn 1.5 hours after dose was due) continue same  Goal of Therapy:  Vancomycin trough level 15-20 mcg/ml  Appropriate antibiotic dosing for renal function; eradication of infection  Plan:  Continue Acyclovir 5mg /kg (295mg ) Q8H x 7 total days Continue Vancomycin 750mg  IV Q12H Measure antibiotic drug levels at steady state Follow up culture results  Kizzie Furnish, PharmD Pager: [phone] 03/28/2014 10:59 AM

## 2014-03-28 NOTE — Progress Notes (Signed)
SLP Cancellation Note  Patient Details Name: Chaunda Vandergriff MRN: [id number] DOB: 1945/06/29   Cancelled treatment:       Reason Eval/Treat Not Completed:  (pt not alert and requring increases oxygen needs - and not appropriate for po intake.  will sign off, please reorder if indicated. )   Claudie Fisherman, [place] [facility] SLP [phone]

## 2014-03-29 ENCOUNTER — Encounter (HOSPITAL_COMMUNITY): Payer: Self-pay

## 2014-03-29 MED ORDER — LORAZEPAM 2 MG/ML IJ SOLN: 0.5000 mg | INTRAMUSCULAR | Status: DC | PRN

## 2014-03-29 MED ORDER — ATROPINE SULFATE 1 % OP SOLN
2.0000 [drp] | OPHTHALMIC | Status: DC | PRN
  Administered 2014-03-29: 2 [drp] via SUBLINGUAL
  Administered 2014-03-29: 4 [drp] via SUBLINGUAL
  Filled 2014-03-29 (×2): qty 2

## 2014-03-29 MED ORDER — SODIUM CHLORIDE 0.9 % IV SOLN
1.0000 mg/h | INTRAVENOUS | Status: DC
  Filled 2014-03-29: qty 10

## 2014-04-01 LAB — CULTURE, BLOOD (ROUTINE X 2)
CULTURE: NO GROWTH
Culture: NO GROWTH

## 2014-04-16 NOTE — Progress Notes (Signed)
Patient ID: Debbie Howe, female   DOB: 06-25-1945, 68 y.o.   MRN: [id number] [facility] PROGRESS NOTE  Debbie Howe UMP:[id number] DOB: Aug 11, 1945 DOA: 02/28/2014 PCP: Charletta Cousin, MD  Brief narrative: 68 y.o. female with a PMH of rheumatoid arthritis, depression who was admitted to [facility] 03/13/2014 with shortness of breath. 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 Patient was transferred to [facility] 03/16/2014 and has completed her first course of chemotherapy. Barrier to discharge is her ongoing respiratory failure, weakness, and pancytopenia. I spoke with the patient's family at the bedside and per patient's significant other the family has decided on DNR/DNI status. Further, family has decided on full comfort care, withdrawing all medical management with the exception of oxygen support.   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 No evidence of brain metastasis. - patient is status post first cycle of chemotherapy.  - Therapeutic/diagnostic thoracentesis done 03/18/14, 600 cc pleural fluid removed, pleural fluid cultures negative. Repeat thoracentesis done 03/24/14 with 750 mL fluid removed.  - PCCM consulted since respiratory status is declining. Repeat chest x-ray showed complete opacification of the right hemithorax likely due to  combination of large mass and pleural effusion. - family has decided to withdraw all care except for that the mask for oxygen support. They want to focus on comfort at this point with morphine drip to help with work of breathing.  Active Problems:  MRSA carrier - Continue decontamination therapy.   Cancer related pain - patient is on morphine drip.   Hypernatremia - likely secondary to dehydration. Sodium subsequently normalized with IV fluids and this morning on a low side, 131.   Acute kidney injury - Creatinine normalized with increased IV fluids.   Sepsis secondary to community-acquired postobstructive pneumonia - Sepsis criteria met on admission with fever, tachycardia, leukocytosis and probable postobstructive pneumonia. - Treated with vancomycin and Zosyn. Vancomycin subsequently discontinued 03/17/14. Changed zosyn to imipenem from 03/22/2014. Due to ongoing neutropenia and episode of fever in past 24 hours vancomycin added. Since family decided on comfort care all antibiotics were stopped 03/28/2014 - Pleural fluid cultures negative.   Nausea without vomiting - no reports of nausea or vomiting.   Loss of appetite / severe protein calorie malnutrition - Dietitian consulted. Patient was on Marinol and nutritional supplements. - NG tube placed for feeding purposes but patient kept pulling the tube out. - continue comfort care.    Mucositis - likely secondary to side effects of chemotherapy - lots of mouth ulcers. Was on acyclovir, fluconazole, mouthwashes and PPI therapy - now focus on comfort    Depression - not on any meds, focus on comfort    Hypokalemia / hypomagnesemia - likely secondary to GI losses. - Given 6 runs IV potassium and 2 g IV magnesium.   Rheumatoid arthritis - Changed prednisone to Decadron IV. Methotrexate was on hold since admission. All meds stopped as focus is on comfort.   Anemia of chronic disease/thrombocytopenia/leukopenia /  pancytopenia -  Pancytopenia likely secondary to bone marrow suppression from malignancy and sequela of chemotherapy -  ANC is zero. With filgrastim continued to be neutropenic  - Status post a total of 5 units of PRBCs and 2 units of platelets. Hgb and platelet count stable post transfusion.   Acute encephalopathy - Likely secondary to side effects of anxiolytics - now lethargic since on morphine drip per family wishes for comfort care    DVT Prophylaxis - Continue SCDs.  Code Status: DNR/DNI Family Communication: Debbie Howe (significant other) updated at the bedside today.  Disposition Plan: patient remains in step down unit.   IV access:   Right upper extremity PICC line placed 03/18/14  Procedures and diagnostic studies:   Bronchoscopy 03/04/2014 - Cytology brushings from RML and RUL; EBBx from RUL bronchus and a nodule from the R mainstem; TBNA targeting pretracheal and subtracheal lymph nodes; All specimens sent for cytology and surg path.  Ct Abdomen Pelvis W Contrast 03/17/2014 1. Progressive enlargement of right pleural effusion with visible new atelectasis and collapse of the right lower lung. Correlation with chest x-ray a may be helpful as this may now represent a largely drowned right lung. 2. New small left pleural effusion. 3. 3 right hepatic lesions are identified, likely representing metastatic disease to the liver. The largest measures 1.5 cm. No other evidence of metastatic disease in the abdomen or pelvis.   Dg Chest Port 1 View 03/17/2014 Complete opacification of the right hemithorax due to a combination of right effusion and right upper lobe consolidation with right upper lobe mass as noted on recent CT. Suspect a combination of interstitial edema and redistribution of blood flow the left lung. No airspace consolidation on the left.   Ir Thoracentesis Asp Pleural Space W/img Guide 03/18/2014: Successful ultrasound guided right thoracentesis yielding 600 cc of  pleural fluid. Electronically Signed By: Maryclare Bean M.D. On: 03/18/2014 16:21   Dg chest 1 view 03/18/14: 1. Grossly unchanged near complete opacification of the right lung secondary to unchanged moderate-sized effusion and extensive right lung consolidative opacities / drowned lung. No evidence of pneumothorax. 2. Right upper extremity approach PICC line tip projects over the superior cavoatrial junction.  Dg Chest Port 1 View 03/21/2014: 1. Again noted is complete opacification of the right hemithorax with volume loss on the right. The right mainstem bronchus is poorly visualized and may be occluded by previously identified right hilar mass as noted on prior CT of 03/13/2014. Associated right pleural effusion may be present. 2. Mild left base subsegmental atelectasis and or infiltrate.   Dg Chest Port 1 View 03/22/2014: No change. Complete opacification of the right hemi thorax without mediastinal shift. Mild patchy density at the left lung base.   Dg Chest Port 1 View 03/23/2014: No change from the earlier study.   Dg Chest Port 1 View 03/23/2014: No significant interval change in the appearance of the chest. Persistent whiteout of the right hemi thorax with probable combination of pleural effusion and postobstructive collapse.   Thoracentesis 03/24/14: 750 mL bloody fluid removed.  Dg Chest Port 1 View 03/24/2014: 1. No pneumothorax following right thoracentesis. 2. Significantly decreased right pleural fluid. 3. Large right upper lobe mass. 4. Changes of COPD and chronic bronchitis with pulmonary vascular congestion and mild interstitial pulmonary edema.   Dg Chest Port 1 View 03/25/2014 1. PICC line stable position. 2. Large right upper lobe mass, unchanged. 3. Small right pleural effusion.  Dg Abd 1 View 03/26/2014 Nonobstructive bowel gas pattern. Enteric tube with  tip over the midline of the mid abdomen likely in the distal stomach  Dg Abd 1 View 03/27/2014 Feeding tube  tip in right lower lobe with consolidation on the right. Advise chest radiograph to further assess the opacification on the right. There is mild dilatation of mid transverse colon. Question early ileus. No free air seen.   Dg Chest Port 1 View 03/28/2014 Complete opacification of the right hemithorax, likely due to combination of large mass and pleural effusion. Left lung clear. Tube and catheter positions as described without pneumothorax.   Dg Abd Portable 1v 03/27/2014 Feeding tube tip in the midportion/body of an elongated stomach.   Dg Abd Portable 1v 03/27/2014 Tip feeding tube at gastroesophageal junction, recommend advancing into small bowel.    Medical Consultants:   Oncology (Dr. Burney Gauze)  Interventional Radiology (Dr. Barbie Banner)  Palliative care (Dr. Timmie Foerster)  PCCM Other Consultants:   Nutrition  IAnti-Infectives:    Zosyn 02/23/2014 --> 03/22/2014  Vancomycin 03/11/2014 --> 03/17/2014; restarted 03/26/2104 --> 03/28/2014   Imipenem 03/22/2014 --> 03/28/2014    Leisa Lenz, MD  [facility] Pager [phone]  If 7PM-7AM, please contact night-coverage [email or web address] Password [email or web address] April 22, 2014, 10:43 AM   LOS: 17 days    HPI/Subjective: No acute overnight events.  Objective: Filed Vitals:   03/28/14 2200 03/28/14 2300 04-22-2014 0000 04/22/14 0526  BP: 129/71 112/77 100/62 79/42  Pulse: 136 129 125 113  Temp:  99 F (37.2 C)    TempSrc:  Axillary    Resp: 31 28 25 15   Height:      Weight:      SpO2: 93% 97% 96% 100%    Intake/Output Summary (Last 24 hours) at 2014/04/22 1043 Last data filed at 2014/04/22 0800  Gross per 24 hour  Intake   1925 ml  Output    900 ml  Net   1025 ml    Exam:   General:  Pt is not responding to verbal or painful stimuli, she is on a Ventimask  Cardiovascular: take cardiac, S1/S2 appreciated  Respiratory: coarse breath sounds, frequent episodes of apnea  Abdomen: Soft, non tender, non  distended, bowel sounds present  Extremities: No edema, pulses DP and PT palpable bilaterally  Neuro: Grossly nonfocal  Data Reviewed: Basic Metabolic Panel:  Recent Labs Lab 03/23/14 0525 03/24/14 0445 03/25/14 0400 03/26/14 0419 03/27/14 1210 03/27/14 2130 03/27/14 2250 03/28/14 0115 03/28/14 0500  NA 153* 153* 142 140 136*  --   --   --  131*  K 3.5* 2.9* 2.6* 3.0* <2.2* >7.7* 2.5*  --  3.0*  CL 111 104 98 98 93*  --   --   --  91*  CO2 30 34* 33* 31 31  --   --   --  29  GLUCOSE 91 100* 281* 95 94  --   --   --  135*  BUN 32* 29* 21 15 14   --   --   --  12  CREATININE 0.97 0.88 0.61 0.63 0.58  --   --   --  0.55  CALCIUM 9.3 9.4 9.1 9.1 8.3*  --   --   --  7.9*  MG  --  1.6 1.4* 1.7  --   --   --  1.2*  --   PHOS 3.6 3.8 2.4  --   --   --   --   --   --    Liver Function Tests:  Recent Labs Lab 03/24/14 0445  03/25/14 0400 03/26/14 0419 03/27/14 1210 03/28/14 0500  AST 56* 41* 41* 32 30  ALT 34 24 21 16 14   ALKPHOS 116 97 100 109 108  BILITOT 0.6 0.4 0.7 0.7 0.6  PROT 6.0 5.6* 5.9* 5.7* 5.4*  ALBUMIN 2.1* 1.9* 2.0* 2.0* 1.8*   No results for input(s): LIPASE, AMYLASE in the last 168 hours. No results for input(s): AMMONIA in the last 168 hours. CBC:  Recent Labs Lab 03/24/14 0445 03/25/14 0400 03/26/14 0419 03/27/14 0345 03/28/14 0500  WBC 0.5* 0.3* 0.3* 0.2* 0.2*  NEUTROABS 0.0* 0.0* 0.0* 0.0* 0.0*  HGB 10.6* 9.8* 10.4* 9.3* 9.0*  HCT 31.7* 29.6* 31.0* 27.4* 26.3*  MCV 89.5 89.7 88.8 87.8 87.1  PLT 91* 53* 34* 93* 57*   Cardiac Enzymes: No results for input(s): CKTOTAL, CKMB, CKMBINDEX, TROPONINI in the last 168 hours. BNP: Invalid input(s): POCBNP CBG: No results for input(s): GLUCAP in the last 168 hours.  Recent Results (from the past 240 hour(s))  Body fluid culture     Status: None   Collection Time: 03/24/14 11:40 AM  Result Value Ref Range Status   Specimen Description THORACENTESIS  Final   Special Requests Immunocompromised   Final   Gram Stain   Final    FEW WBC PRESENT, PREDOMINANTLY PMN NO ORGANISMS SEEN Performed at [facility]    Culture   Final    NO GROWTH 3 DAYS Performed at [facility]    Report Status 03/27/2014 FINAL  Final  Clostridium Difficile by PCR     Status: None   Collection Time: 03/24/14  3:36 PM  Result Value Ref Range Status   C difficile by pcr NEGATIVE NEGATIVE Final    Comment: Performed at [facility]  Culture, blood (routine x 2)     Status: None (Preliminary result)   Collection Time: 03/26/14  7:50 AM  Result Value Ref Range Status   Specimen Description BLOOD LEFT ARM  Final   Special Requests BOTTLES DRAWN AEROBIC AND ANAEROBIC 5CC  Final   Culture  Setup Time   Final    03/26/2014 11:03 Performed at [facility]    Culture   Final           BLOOD CULTURE RECEIVED NO GROWTH TO DATE CULTURE WILL BE HELD FOR 5 DAYS BEFORE ISSUING A FINAL NEGATIVE REPORT Performed at [facility]    Report Status PENDING  Incomplete  Culture, blood (routine x 2)     Status: None (Preliminary result)   Collection Time: 03/26/14  7:55 AM  Result Value Ref Range Status   Specimen Description BLOOD LEFT HAND  Final   Special Requests BOTTLES DRAWN AEROBIC ONLY [place]  Final   Culture  Setup Time   Final    03/26/2014 11:04 Performed at [facility]    Culture   Final           BLOOD CULTURE RECEIVED NO GROWTH TO DATE CULTURE WILL BE HELD FOR 5 DAYS BEFORE ISSUING A FINAL NEGATIVE REPORT Note: Culture results may be compromised due to an inadequate volume of blood received in culture bottles. Performed at [facility]    Report Status PENDING  Incomplete  Urine culture     Status: None   Collection Time: 03/26/14  8:51 AM  Result Value Ref Range Status   Specimen Description URINE, CATHETERIZED  Final   Special Requests imipenem, diflucan Immunocompromised  Final   Culture  Setup Time  Final    03/26/2014 11:02 Performed at  [place] Performed at [facility]   Final   Culture NO GROWTH Performed at [facility]   Final   Report Status 03/27/2014 FINAL  Final     Scheduled Meds: . ipratropium-albuterol  3 mL Nebulization Q6H  . scopolamine  1 patch Transdermal Q72H   Continuous Infusions: . morphine 2 mg/hr (04-Apr-2014 0800)

## 2014-04-16 NOTE — Progress Notes (Signed)
Chaplain was called by nursing staff to provide support for family as the pt was dying. Chaplain provided emotional and spiritual support for family and friends. Chaplain prayed with family. Family denied anymore services.   Charyl Dancer, chaplain

## 2014-04-16 NOTE — Progress Notes (Addendum)
Family, including husband at the bedside.  Bp undetectable.  Heart rate was 120 and decreased over minutes to PEA and then asystole. No RR, No HR, No BP.  Time of death 6. No heart and lung sounds verified by two RN's:  Jerrel Ivory, RN and Prince Rome, RN.  Dr. Charlies Silvers notified.

## 2014-04-16 NOTE — Progress Notes (Signed)
Debbie Howe is now in a terminal mode. She "declared herself" yesterday. She had re-collapse of the right lung.  She developed pneumonia. She was still profoundly neutropenic.  Her family made her DO NOT RESUSCITATE. I very much appreciate the outstanding help that she received from all the staff in the ICU and from the other physicians taking care of her.  She now is on a morphine drip. She has periods of apnea. She is comfortable. She has face mask oxygen on.  Her family is with her.  This is a very be disappointment for me. I really thought that we might be able to help her out given that she had small cell lung cancer. I thought we might be able to get a quick response to get her right lung open. Unfortunately this was not the case.  I know that she tried all that she can try. Her family was always with her and they helped as much as possible.  I just don't believe that she will make it more than 2-3 hours.  The oxygen is clearly keeping her alive right now. If the family wants to take that off, I certainly would not have any objections to that.  Again, I thank everybody for trying to help her. I know that she will "win" it would just be in [place].  God's will shall be done. She has never been afraid to pass on.   pete

## 2014-04-16 NOTE — Progress Notes (Signed)
40 ml of morphine drip wasted in sink.  Witness Amada Jupiter, RN.

## 2014-04-16 NOTE — Progress Notes (Signed)
Patient desaturated to 40's on nasal cannula 6 liters. Patient placed on NRB 15 Liters. Patient remains unresponsive with GCS of 6. Family updated on patients decreasing respiratory status. NP Baltazar Najjar notified assistance and recommendations to improve patient situation. NP on way to bedside. Patient remains DNR.

## 2014-04-16 NOTE — Discharge Summary (Signed)
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 Active Problems:   Pneumonia   Small cell carcinoma   Cancer related pain   Palliative care encounter   Nausea without vomiting   Loss of appetite   Pleural effusion, right   Parapneumonic effusion   Acute encephalopathy   Protein-calorie malnutrition, severe   Depression   Hypokalemia   Rheumatoid arthritis   Anemia of chronic disease   Thrombocytopenia   MRSA carrier   Midline low back pain without sciatica   Antineoplastic chemotherapy induced pancytopenia   Hypernatremia   PNA (pneumonia)   Collapse of right lung   Pleural effusion   Pneumonia, organism unspecified   Small cell lung cancer   Mucositis   Acute respiratory failure with hypoxia    History of present illness:  68 y.o. female with a PMH of rheumatoid arthritis, depression who was admitted to [facility] 03/22/14 with shortness of breath. 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 Patient was transferred to [facility] 03/16/2014 and has completed her first course of chemotherapy. Barrier to discharge is her ongoing respiratory failure, weakness, and pancytopenia. I spoke with the patient's family at the bedside and per patient's significant other the family has decided on DNR/DNI status. Further, family has decided on full comfort care, withdrawing all medical management with the exception of oxygen support.  Principal Problem:  Metastatic small cell carcinoma to  bone/acute respiratory failure with hypoxia/right malignant versus parapneumonic pleural effusion - Diagnosed via bronchoscopy 02/20/2014 after imaging studies showed a large right hilar and suprahilar mass with extensive lymphadenopathy with staging workup consistent with osseous and liver metastasis. No evidence of brain metastasis. - patient is status post first cycle of chemotherapy.  - Therapeutic/diagnostic thoracentesis done 03/18/14, 600 cc pleural fluid removed, pleural fluid cultures negative. Repeat thoracentesis done 03/24/14 with 750 mL fluid removed.  - family decided to withdraw the care 03/18/2014. All meds stopped except for oxygen support per family wishes. Pt expired at 14:45  On 2014/04/08.      DVT Prophylaxis - SCD's   Code Status: DNR/DNI    IV access:   Right upper extremity PICC line placed 03/18/14  Procedures and diagnostic studies:   Bronchoscopy 02/24/2014 - Cytology brushings from RML and RUL; EBBx from RUL bronchus and a nodule from the R mainstem; TBNA targeting pretracheal and subtracheal lymph nodes; All specimens sent for cytology and surg path.  Ct Abdomen Pelvis W Contrast 03/17/2014 1. Progressive enlargement of right pleural effusion with visible new atelectasis and collapse of the right lower lung. Correlation with chest x-ray a may be helpful as this may now represent a largely drowned right lung. 2. New small left pleural effusion. 3. 3 right hepatic lesions are identified, likely representing metastatic disease to the liver. The largest measures 1.5 cm. No other evidence of metastatic disease in the abdomen or pelvis.   Dg Chest Port 1 View 03/17/2014 Complete opacification of the right hemithorax due to a combination of right effusion and right upper lobe consolidation with right upper  lobe mass as noted on recent CT. Suspect a combination of interstitial edema and redistribution of blood flow the left lung. No airspace  consolidation on the left.   Ir Thoracentesis Asp Pleural Space W/img Guide 03/18/2014: Successful ultrasound guided right thoracentesis yielding 600 cc of pleural fluid. Electronically Signed By: Maryclare Bean M.D. On: 03/18/2014 16:21   Dg chest 1 view 03/18/14: 1. Grossly unchanged near complete opacification of the right lung secondary to unchanged moderate-sized effusion and extensive right lung consolidative opacities / drowned lung. No evidence of pneumothorax. 2. Right upper extremity approach PICC line tip projects over the superior cavoatrial junction.  Dg Chest Port 1 View 03/21/2014: 1. Again noted is complete opacification of the right hemithorax with volume loss on the right. The right mainstem bronchus is poorly visualized and may be occluded by previously identified right hilar mass as noted on prior CT of 03/07/2014. Associated right pleural effusion may be present. 2. Mild left base subsegmental atelectasis and or infiltrate.   Dg Chest Port 1 View 03/22/2014: No change. Complete opacification of the right hemi thorax without mediastinal shift. Mild patchy density at the left lung base.   Dg Chest Port 1 View 03/23/2014: No change from the earlier study.   Dg Chest Port 1 View 03/23/2014: No significant interval change in the appearance of the chest. Persistent whiteout of the right hemi thorax with probable combination of pleural effusion and postobstructive collapse.   Thoracentesis 03/24/14: 750 mL bloody fluid removed.  Dg Chest Port 1 View 03/24/2014: 1. No pneumothorax following right thoracentesis. 2. Significantly decreased right pleural fluid. 3. Large right upper lobe mass. 4. Changes of COPD and chronic bronchitis with pulmonary vascular congestion and mild interstitial pulmonary edema.   Dg Chest Port 1 View 03/25/2014 1. PICC line stable position. 2. Large right upper lobe mass, unchanged. 3. Small right pleural effusion.  Dg Abd 1 View 03/26/2014  Nonobstructive bowel gas pattern. Enteric tube with tip over the midline of the mid abdomen likely in the distal stomach  Dg Abd 1 View 03/27/2014 Feeding tube tip in right lower lobe with consolidation on the right. Advise chest radiograph to further assess the opacification on the right. There is mild dilatation of mid transverse colon. Question early ileus. No free air seen.   Dg Chest Port 1 View 03/28/2014 Complete opacification of the right hemithorax, likely due to combination of large mass and pleural effusion. Left lung clear. Tube and catheter positions as described without pneumothorax.   Dg Abd Portable 1v 03/27/2014 Feeding tube tip in the midportion/body of an elongated stomach.   Dg Abd Portable 1v 03/27/2014 Tip feeding tube at gastroesophageal junction, recommend advancing into small bowel.    Medical Consultants:   Oncology (Dr. Burney Gauze)  Interventional Radiology (Dr. Barbie Banner)  Palliative care (Dr. Timmie Foerster)  PCCM Other Consultants:   Nutrition  IAnti-Infectives:    Zosyn 03/13/2014 --> 03/22/2014  Vancomycin 03/05/2014 --> 03/17/2014; restarted 03/26/2104 --> 03/28/2014   Imipenem 03/22/2014 --> 03/28/2014  Hospital Course:   Time: 14:45 10-Apr-2014  Signed:  Leisa Lenz  [facility] Apr 10, 2014, 3:16 PM

## 2014-04-16 NOTE — Plan of Care (Addendum)
RN paged this NP secondary to pt desatting into the 40s. NRB applied and sats improved. Pt has a terminal diagnosis and very very poor prognosis. This has been discussed with family and pt was made a DNR today.  However, this hypoxia is a new development and this NP spoke with family. Family is aware of prognosis and that pt is going to die. After explaining options, family elected to withdraw care except for medications/treatment for comfort. The entire family is in agreement with this. HCPOA, son, and pt's husband are in agreement too, stating they "don't want her to suffer any longer".  Morphine drip, Atropine, Ativan, and Scopalamine ordered. O2 for comfort. No other treatment as per family wishes. Support given to family who are extremely reasonable and concerned for Debbie Howe. Chaplain offered. Comfort cart per RN.  Baltazar Najjar, NP Update: Pt resting quietly and family is pleased with her comfort level.  Baltazar Najjar, NP

## 2014-04-16 NOTE — Progress Notes (Signed)
Nutrition Brief Note  Chart reviewed. Pt now transitioning to comfort care.  No further nutrition interventions warranted at this time.  Please re-consult as needed.   Atlee Abide MS RD LDN Clinical Dietitian [phone]

## 2014-04-16 DEATH — deceased

## 2015-06-15 IMAGING — US US THORACENTESIS ASP PLEURAL SPACE W/IMG GUIDE
1 series · 5 of 5 positions shown · non-contrast
Comparison: None.

MEDICATIONS:
10 cc 1% lidocaine

COMPLICATIONS:
None immediate

INDICATION: Symptomatic RIGHT sided pleural effusion

EXAM:
US THORACENTESIS ASP PLEURAL SPACE W/IMG GUIDE
TECHNIQUE: Informed written consent was obtained from the patient after a
discussion of the risks, benefits and alternatives to treatment. A
timeout was performed prior to the initiation of the procedure.

[Series 1: us thoracentesis asp pleural space w/img guide · 0.24mm/px · 5 of 5 slices shown]
[im 1/5]
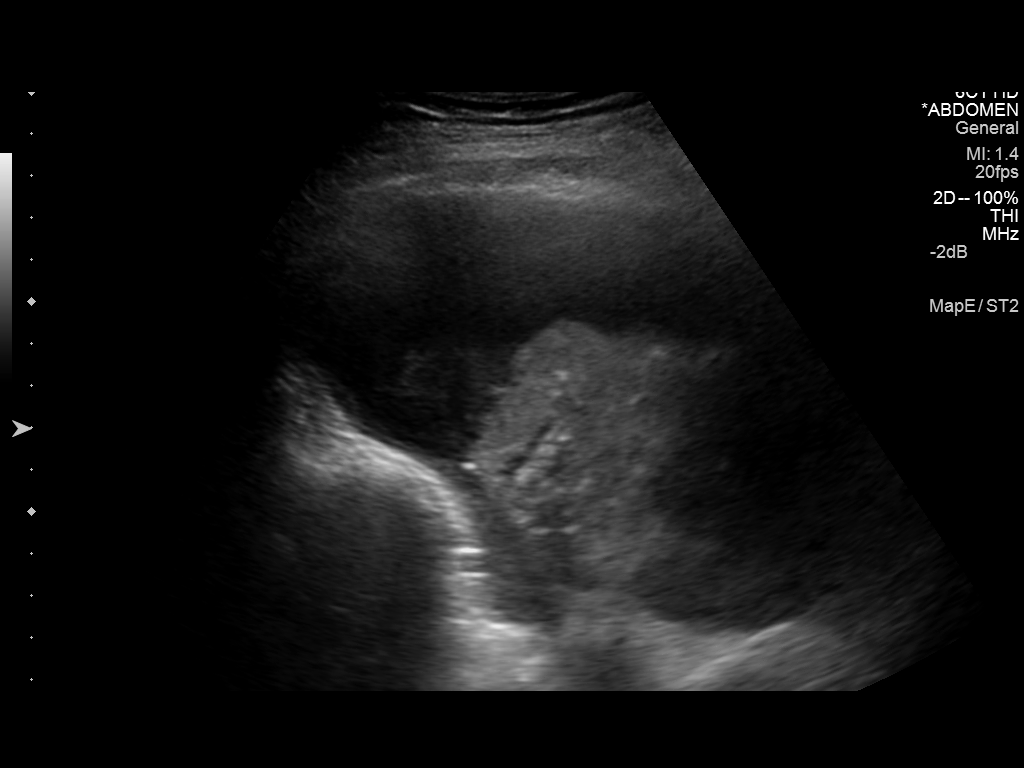
[im 2/5]
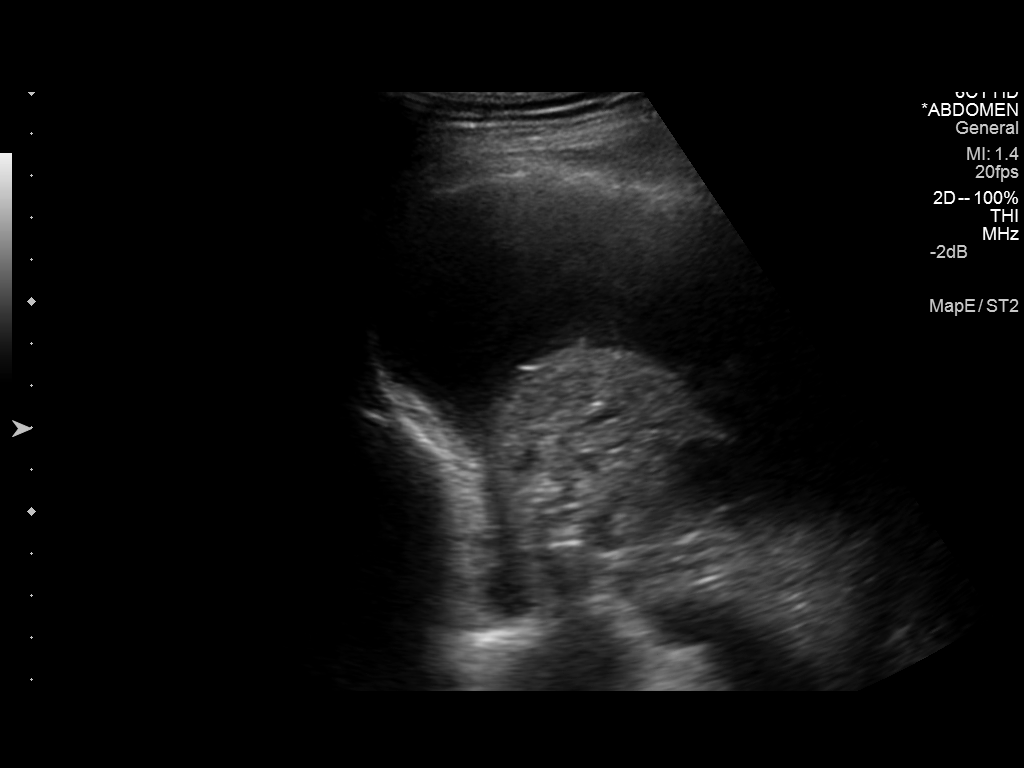
[im 3/5]
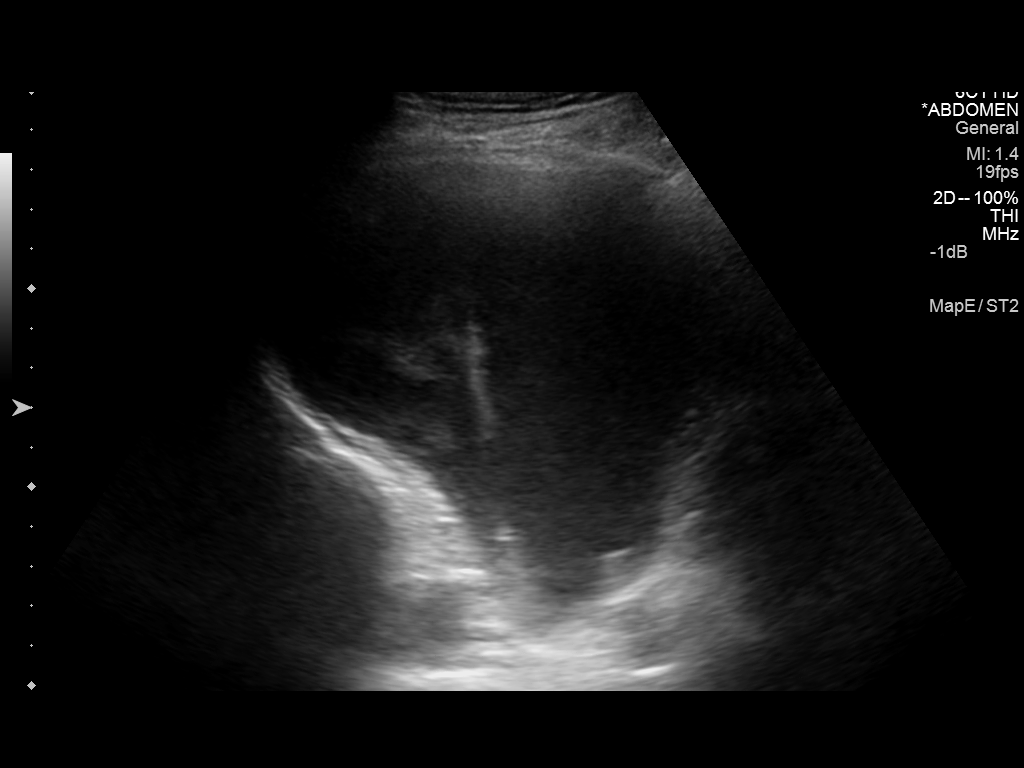
[im 4/5]
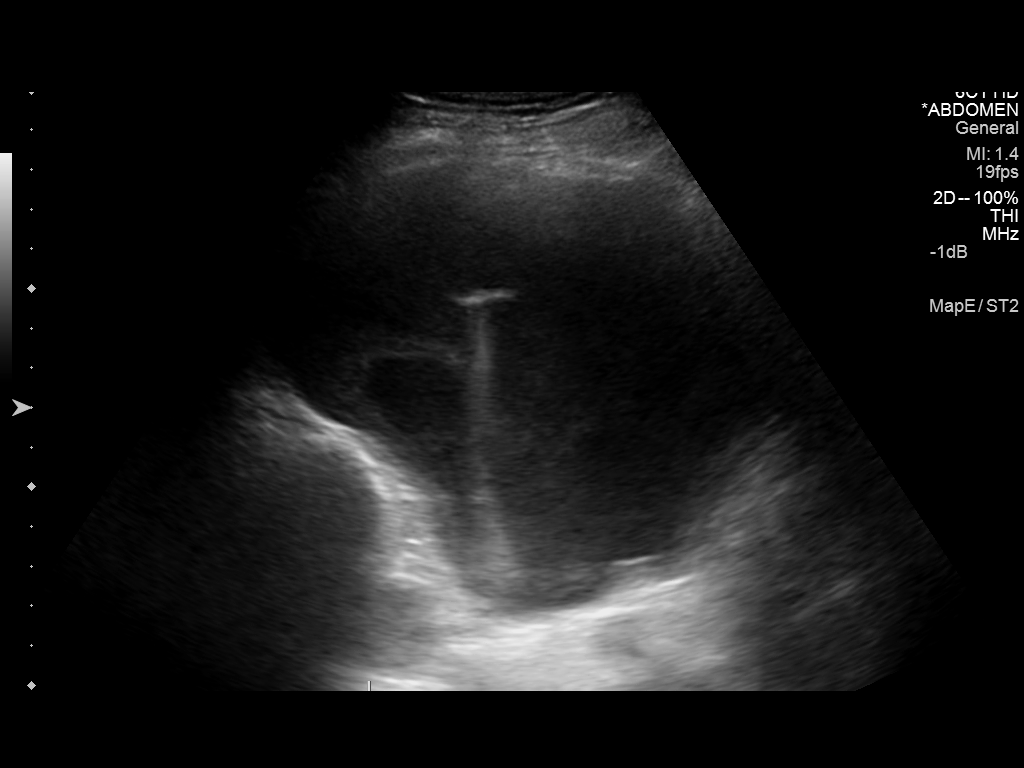
[im 5/5]
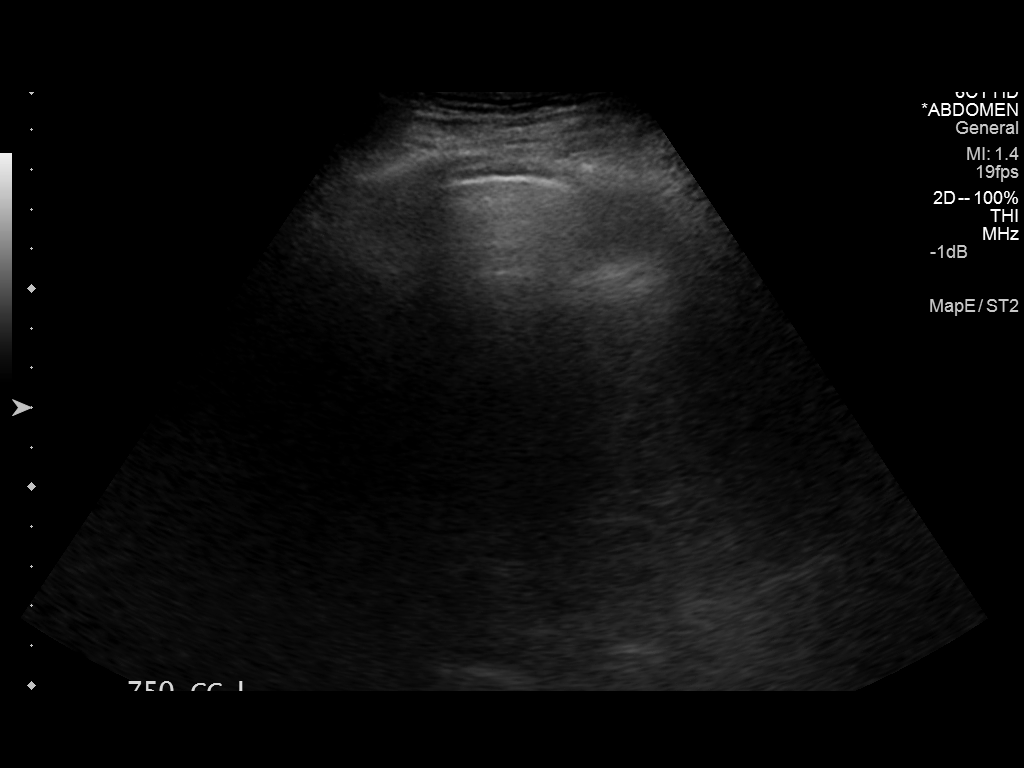

[5 of 5 positions shown; findings below may reference images not displayed]

Initial ultrasound scanning demonstrates a right pleural effusion.
The lower chest was prepped and draped in the usual sterile fashion.
1% lidocaine was used for local anesthesia.

Under direct ultrasound guidance, a 19 gauge, 7-cm, Yueh catheter
was introduced. An ultrasound image was saved for documentation
purposes. The thoracentesis was performed. The catheter was removed
and a dressing was applied. The patient tolerated the procedure well
without immediate post procedural complication. The patient was
escorted to have an upright chest radiograph.
FINDINGS: A total of approximately 750 cc of bloody fluid was removed.
Requested samples were sent to the laboratory.
IMPRESSION: Successful ultrasound-guided right sided thoracentesis yielding 750
cc of pleural fluid.

Read by:  Maryjane Ravelo

## 2015-06-16 IMAGING — DX DG CHEST 1V PORT
1 series · 1 of 1 positions shown · non-contrast
Comparison: 03/24/2014.  CT 03/12/2014.

CLINICAL DATA: Shortness of breath.  Lung cancer.

EXAM:
PORTABLE CHEST - 1 VIEW

[chest ap]
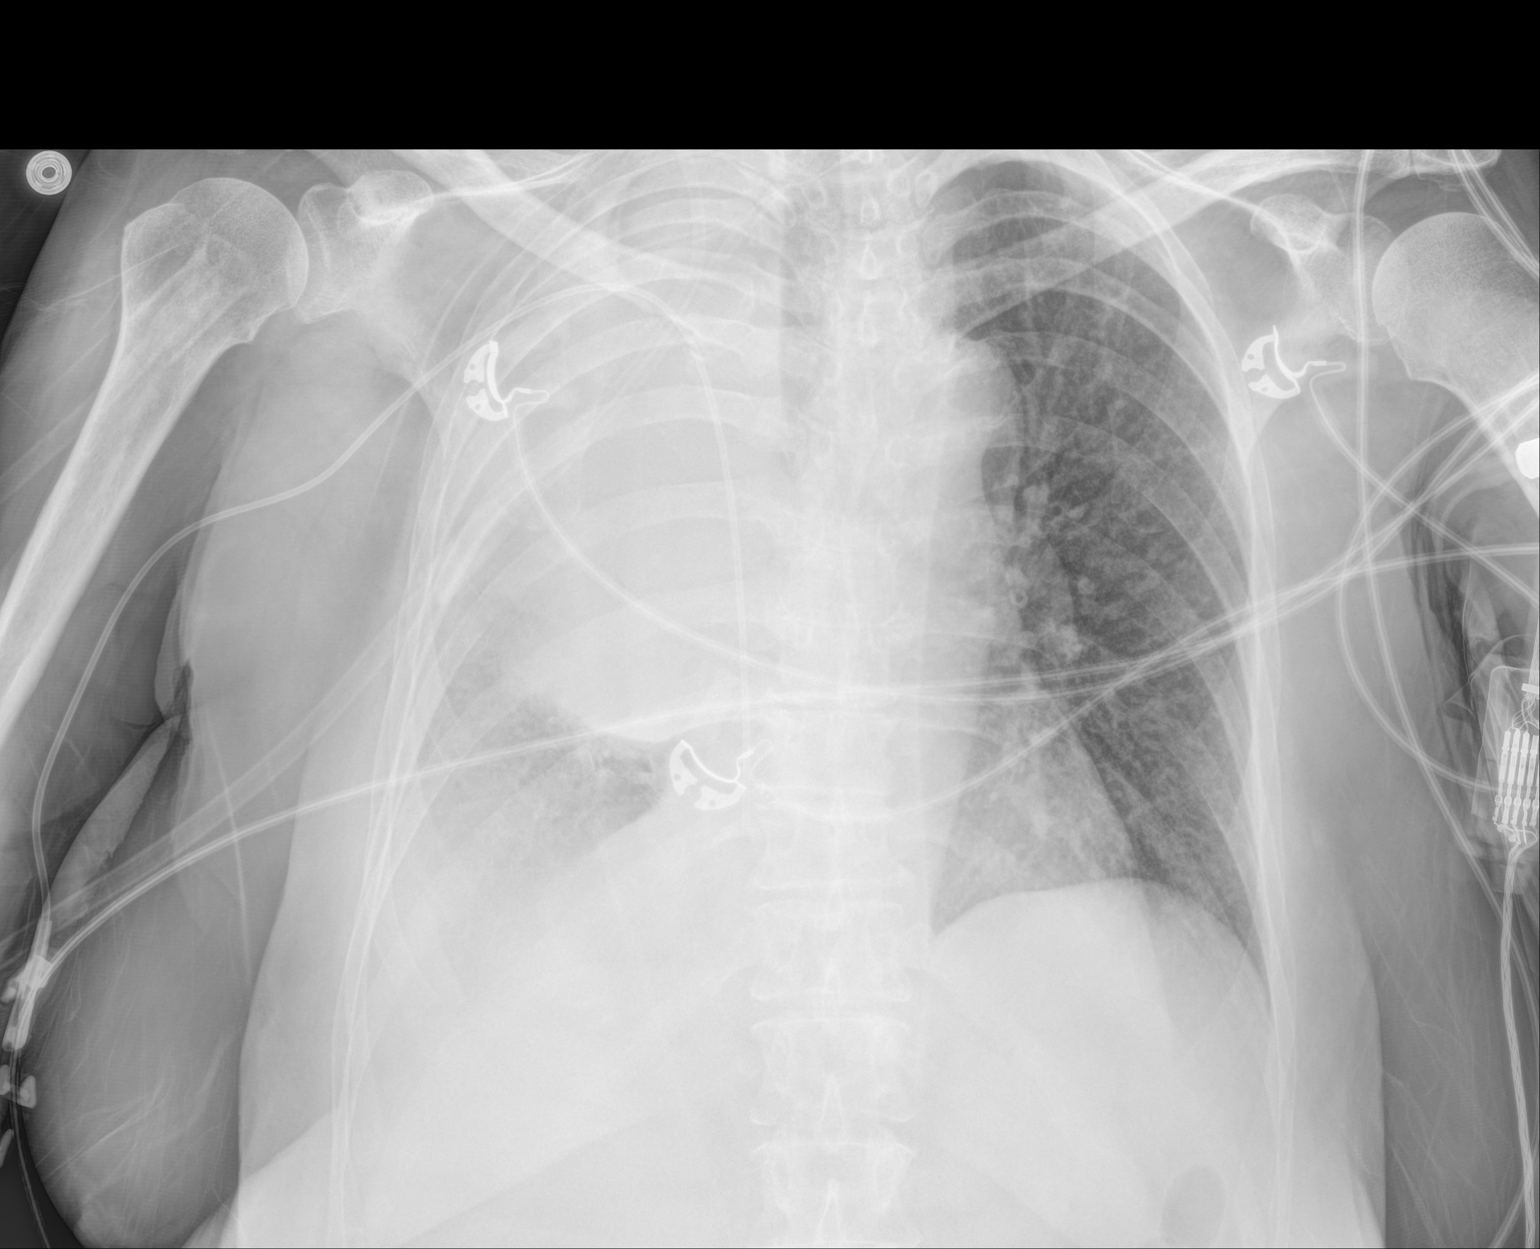

[1 of 1 positions shown; findings below may reference images not displayed]

FINDINGS: Right PICC line in stable position. Heart size stable. Large right
upper lobe mass is stable. Small right pleural effusion. No
pneumothorax . No acute bony abnormality. Prior cervical spine
fusion.
IMPRESSION: 1. PICC line stable position.
2. Large right upper lobe mass, unchanged.
3. Small right pleural effusion.

## 2015-06-17 IMAGING — DX DG ABDOMEN 1V
1 series · 1 of 1 positions shown · non-contrast
Comparison: CT abdomen 03/16/2014

CLINICAL DATA: Assess feeding tube placement.

EXAM:
ABDOMEN - 1 VIEW

[abdomen kub]
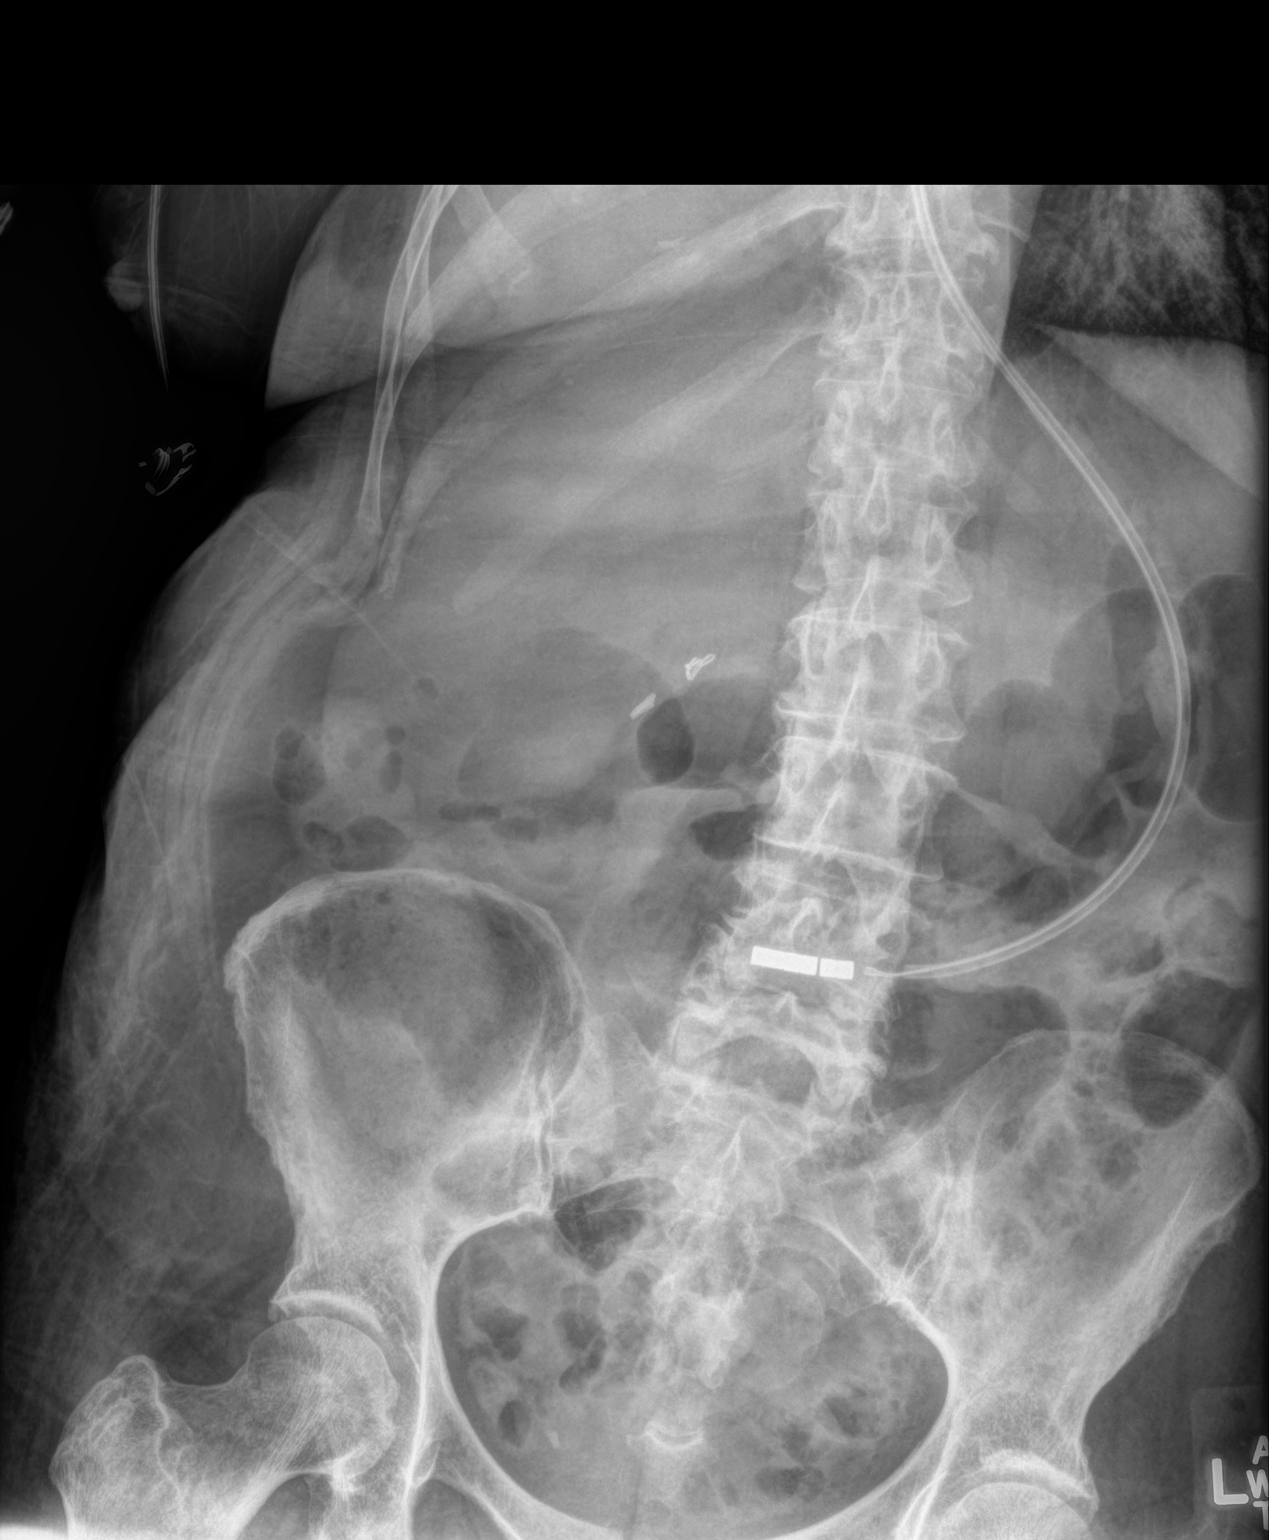

[1 of 1 positions shown; findings below may reference images not displayed]

FINDINGS: Enteric tube courses through the region of the stomach with tip over
the midline in the mid abdomen likely over the distal stomach. Bowel
gas pattern is nonobstructive. There is no definite free peritoneal
air. Remainder of the exam is unchanged to include moderate right
lung opacification.
IMPRESSION: Nonobstructive bowel gas pattern.

Enteric tube with tip over the midline of the mid abdomen likely in
the distal stomach.

## 2015-06-18 IMAGING — DX DG ABD PORTABLE 1V
1 series · 1 of 1 positions shown · non-contrast
Comparison: Portable exam 9149 hr compared to 9598 hr.

CLINICAL DATA: Feeding tube placement

EXAM:
PORTABLE ABDOMEN - 1 VIEW

[abdomen kub]
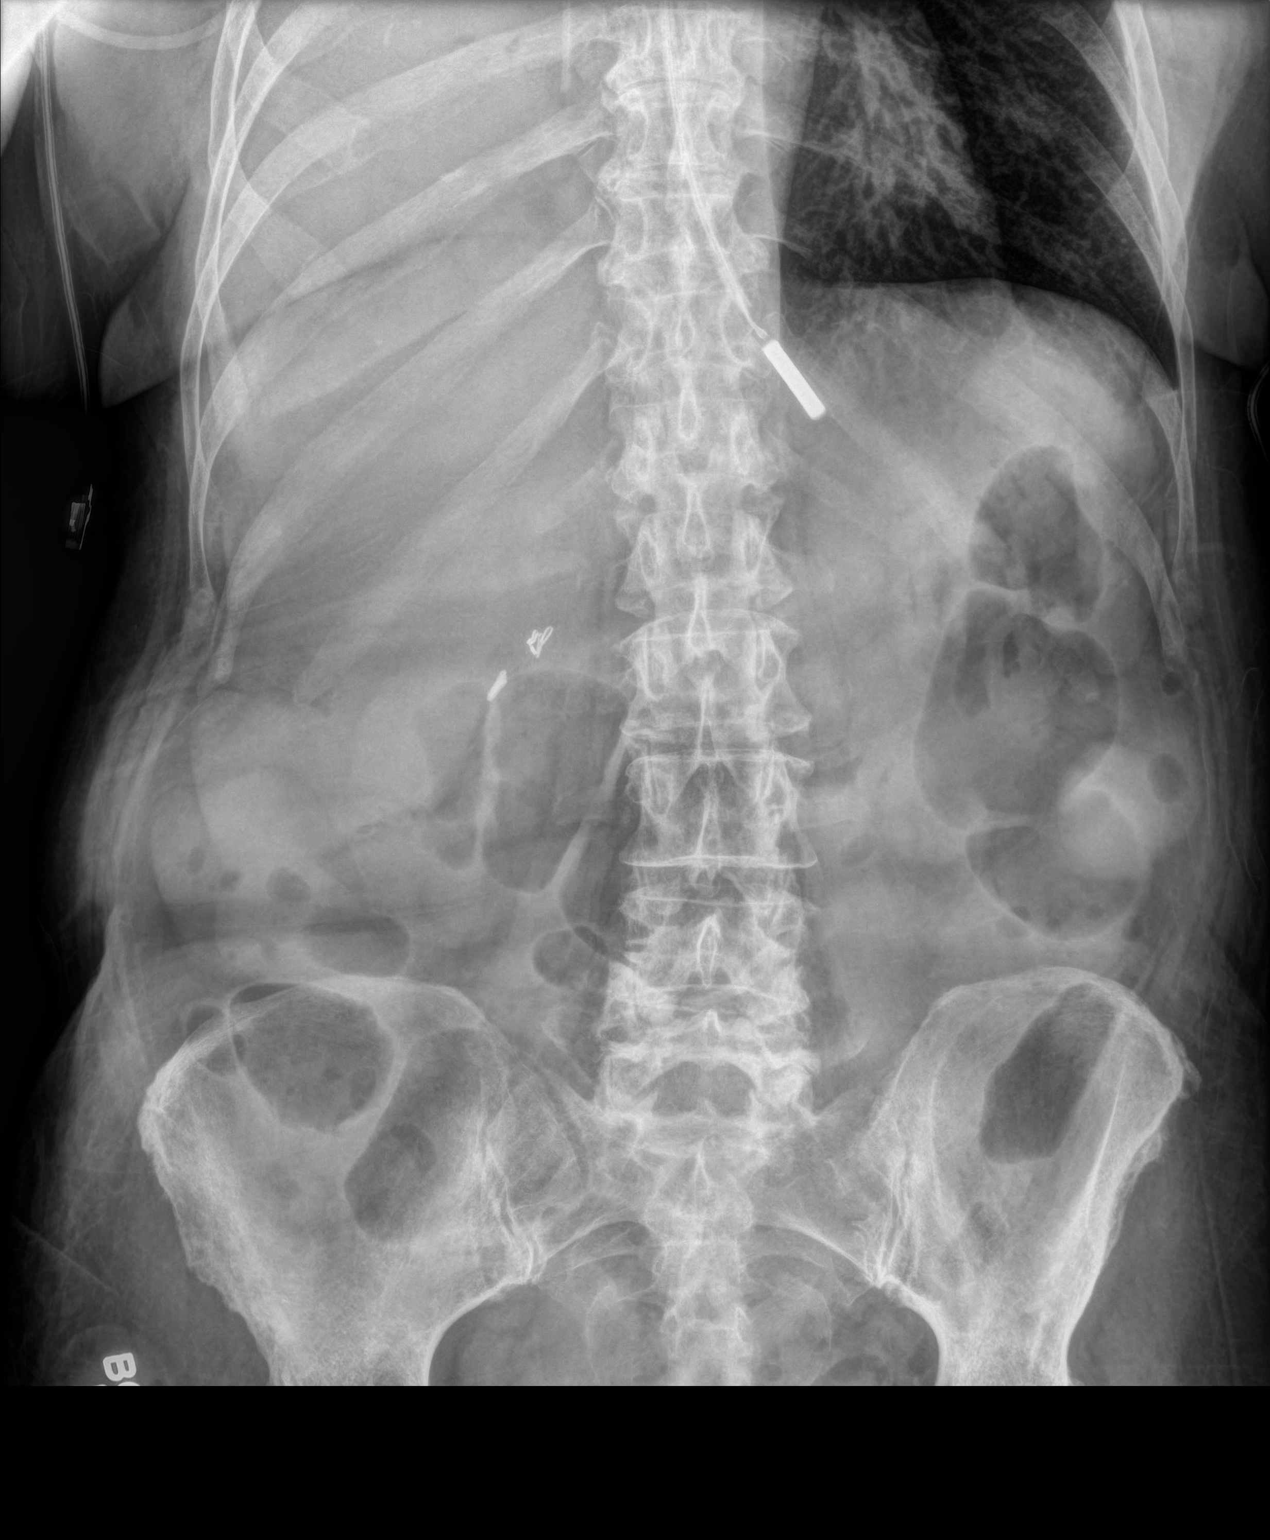

[1 of 1 positions shown; findings below may reference images not displayed]

FINDINGS: Tip of feeding tube projects over gastroesophageal junction.

Bowel gas pattern normal.

Surgical clips RIGHT upper quadrant question cholecystectomy.

Elevation of RIGHT diaphragm with question atelectasis versus
infiltrate at RIGHT base.

Bones slightly demineralized.

No urinary tract calcification.
IMPRESSION: Tip feeding tube at gastroesophageal junction, recommend advancing
into small bowel.

## 2015-08-01 ENCOUNTER — Other Ambulatory Visit: Payer: Self-pay | Admitting: Nurse Practitioner
